# Patient Record
Sex: Male | Born: 1945 | Race: Black or African American | Hispanic: No | Marital: Married | Smoking: Never smoker
Health system: Southern US, Community
[De-identification: ages and names within clinical notes are randomized; demographics above are authoritative.]

## PROBLEM LIST (undated history)

## (undated) DIAGNOSIS — C61 Malignant neoplasm of prostate: Secondary | ICD-10-CM

## (undated) DIAGNOSIS — F419 Anxiety disorder, unspecified: Secondary | ICD-10-CM

## (undated) DIAGNOSIS — I10 Essential (primary) hypertension: Secondary | ICD-10-CM

## (undated) DIAGNOSIS — D126 Benign neoplasm of colon, unspecified: Secondary | ICD-10-CM

## (undated) DIAGNOSIS — M797 Fibromyalgia: Secondary | ICD-10-CM

## (undated) DIAGNOSIS — F4310 Post-traumatic stress disorder, unspecified: Secondary | ICD-10-CM

## (undated) DIAGNOSIS — D490 Neoplasm of unspecified behavior of digestive system: Secondary | ICD-10-CM

## (undated) DIAGNOSIS — K529 Noninfective gastroenteritis and colitis, unspecified: Secondary | ICD-10-CM

## (undated) DIAGNOSIS — R918 Other nonspecific abnormal finding of lung field: Secondary | ICD-10-CM

## (undated) DIAGNOSIS — Z8719 Personal history of other diseases of the digestive system: Secondary | ICD-10-CM

## (undated) DIAGNOSIS — E119 Type 2 diabetes mellitus without complications: Secondary | ICD-10-CM

## (undated) DIAGNOSIS — M549 Dorsalgia, unspecified: Secondary | ICD-10-CM

## (undated) DIAGNOSIS — K2970 Gastritis, unspecified, without bleeding: Secondary | ICD-10-CM

## (undated) DIAGNOSIS — K862 Cyst of pancreas: Secondary | ICD-10-CM

## (undated) DIAGNOSIS — F329 Major depressive disorder, single episode, unspecified: Secondary | ICD-10-CM

## (undated) DIAGNOSIS — K2270 Barrett's esophagus without dysplasia: Secondary | ICD-10-CM

## (undated) DIAGNOSIS — N433 Hydrocele, unspecified: Secondary | ICD-10-CM

## (undated) DIAGNOSIS — E785 Hyperlipidemia, unspecified: Secondary | ICD-10-CM

## (undated) DIAGNOSIS — C49A0 Gastrointestinal stromal tumor, unspecified site: Secondary | ICD-10-CM

## (undated) DIAGNOSIS — F32A Depression, unspecified: Secondary | ICD-10-CM

## (undated) DIAGNOSIS — H903 Sensorineural hearing loss, bilateral: Secondary | ICD-10-CM

## (undated) DIAGNOSIS — C49A2 Gastrointestinal stromal tumor of stomach: Secondary | ICD-10-CM

## (undated) DIAGNOSIS — G4730 Sleep apnea, unspecified: Secondary | ICD-10-CM

## (undated) DIAGNOSIS — K760 Fatty (change of) liver, not elsewhere classified: Secondary | ICD-10-CM

## (undated) DIAGNOSIS — K219 Gastro-esophageal reflux disease without esophagitis: Secondary | ICD-10-CM

## (undated) DIAGNOSIS — D649 Anemia, unspecified: Secondary | ICD-10-CM

## (undated) DIAGNOSIS — Z973 Presence of spectacles and contact lenses: Secondary | ICD-10-CM

## (undated) DIAGNOSIS — K589 Irritable bowel syndrome without diarrhea: Secondary | ICD-10-CM

## (undated) DIAGNOSIS — G8929 Other chronic pain: Secondary | ICD-10-CM

## (undated) DIAGNOSIS — B9681 Helicobacter pylori [H. pylori] as the cause of diseases classified elsewhere: Secondary | ICD-10-CM

## (undated) HISTORY — PX: PROSTATE BIOPSY: SHX241

## (undated) HISTORY — DX: Anxiety disorder, unspecified: F41.9

## (undated) HISTORY — DX: Helicobacter pylori (H. pylori) as the cause of diseases classified elsewhere: B96.81

## (undated) HISTORY — DX: Barrett's esophagus without dysplasia: K22.70

## (undated) HISTORY — PX: CHOLECYSTECTOMY: SHX55

## (undated) HISTORY — DX: Neoplasm of unspecified behavior of digestive system: D49.0

## (undated) HISTORY — DX: Benign neoplasm of colon, unspecified: D12.6

## (undated) HISTORY — DX: Cyst of pancreas: K86.2

## (undated) HISTORY — DX: Dorsalgia, unspecified: M54.9

## (undated) HISTORY — DX: Gastrointestinal stromal tumor, unspecified site: C49.A0

## (undated) HISTORY — DX: Gastro-esophageal reflux disease without esophagitis: K21.9

## (undated) HISTORY — DX: Gastritis, unspecified, without bleeding: K29.70

## (undated) HISTORY — DX: Major depressive disorder, single episode, unspecified: F32.9

## (undated) HISTORY — DX: Post-traumatic stress disorder, unspecified: F43.10

## (undated) HISTORY — DX: Essential (primary) hypertension: I10

## (undated) HISTORY — DX: Other chronic pain: G89.29

## (undated) HISTORY — DX: Depression, unspecified: F32.A

## (undated) HISTORY — DX: Noninfective gastroenteritis and colitis, unspecified: K52.9

---

## 1999-05-07 HISTORY — PX: CARDIAC CATHETERIZATION: SHX172

## 1999-09-05 ENCOUNTER — Inpatient Hospital Stay (HOSPITAL_COMMUNITY): Admission: EM | Admit: 1999-09-05 | Discharge: 1999-09-06 | Payer: Self-pay | Admitting: Cardiology

## 2000-11-15 ENCOUNTER — Encounter: Payer: Self-pay | Admitting: Emergency Medicine

## 2000-11-15 ENCOUNTER — Emergency Department (HOSPITAL_COMMUNITY): Admission: EM | Admit: 2000-11-15 | Discharge: 2000-11-15 | Payer: Self-pay | Admitting: Emergency Medicine

## 2001-04-03 ENCOUNTER — Encounter: Payer: Self-pay | Admitting: Family Medicine

## 2001-04-03 ENCOUNTER — Ambulatory Visit (HOSPITAL_COMMUNITY): Admission: RE | Admit: 2001-04-03 | Discharge: 2001-04-03 | Payer: Self-pay | Admitting: Family Medicine

## 2001-04-12 ENCOUNTER — Emergency Department (HOSPITAL_COMMUNITY): Admission: EM | Admit: 2001-04-12 | Discharge: 2001-04-12 | Payer: Self-pay | Admitting: Emergency Medicine

## 2001-10-28 ENCOUNTER — Encounter: Payer: Self-pay | Admitting: Family Medicine

## 2001-10-28 ENCOUNTER — Ambulatory Visit (HOSPITAL_COMMUNITY): Admission: RE | Admit: 2001-10-28 | Discharge: 2001-10-28 | Payer: Self-pay | Admitting: Family Medicine

## 2001-12-08 ENCOUNTER — Ambulatory Visit (HOSPITAL_COMMUNITY): Admission: RE | Admit: 2001-12-08 | Discharge: 2001-12-08 | Payer: Self-pay | Admitting: Gastroenterology

## 2002-08-11 ENCOUNTER — Ambulatory Visit (HOSPITAL_COMMUNITY): Admission: RE | Admit: 2002-08-11 | Discharge: 2002-08-11 | Payer: Self-pay | Admitting: Family Medicine

## 2002-08-11 ENCOUNTER — Encounter: Payer: Self-pay | Admitting: Family Medicine

## 2003-10-27 ENCOUNTER — Ambulatory Visit (HOSPITAL_COMMUNITY): Admission: RE | Admit: 2003-10-27 | Discharge: 2003-10-27 | Payer: Self-pay | Admitting: Family Medicine

## 2003-12-14 ENCOUNTER — Ambulatory Visit (HOSPITAL_COMMUNITY): Admission: RE | Admit: 2003-12-14 | Discharge: 2003-12-14 | Payer: Self-pay | Admitting: Internal Medicine

## 2004-06-04 ENCOUNTER — Emergency Department (HOSPITAL_COMMUNITY): Admission: EM | Admit: 2004-06-04 | Discharge: 2004-06-04 | Payer: Self-pay | Admitting: Emergency Medicine

## 2004-06-13 ENCOUNTER — Ambulatory Visit: Payer: Self-pay | Admitting: Orthopedic Surgery

## 2004-06-21 ENCOUNTER — Ambulatory Visit: Payer: Self-pay | Admitting: Orthopedic Surgery

## 2004-06-25 ENCOUNTER — Ambulatory Visit (HOSPITAL_COMMUNITY): Admission: RE | Admit: 2004-06-25 | Discharge: 2004-06-25 | Payer: Self-pay | Admitting: Orthopedic Surgery

## 2004-07-23 ENCOUNTER — Ambulatory Visit: Payer: Self-pay | Admitting: Orthopedic Surgery

## 2004-10-02 ENCOUNTER — Ambulatory Visit: Payer: Self-pay | Admitting: *Deleted

## 2004-10-11 ENCOUNTER — Ambulatory Visit: Payer: Self-pay | Admitting: Internal Medicine

## 2005-12-03 ENCOUNTER — Ambulatory Visit: Payer: Self-pay | Admitting: Internal Medicine

## 2006-03-21 ENCOUNTER — Encounter (INDEPENDENT_AMBULATORY_CARE_PROVIDER_SITE_OTHER): Payer: Self-pay | Admitting: Specialist

## 2006-03-21 ENCOUNTER — Ambulatory Visit (HOSPITAL_COMMUNITY): Admission: RE | Admit: 2006-03-21 | Discharge: 2006-03-21 | Payer: Self-pay | Admitting: Internal Medicine

## 2006-03-21 ENCOUNTER — Ambulatory Visit: Payer: Self-pay | Admitting: Internal Medicine

## 2006-04-10 ENCOUNTER — Ambulatory Visit: Payer: Self-pay | Admitting: Internal Medicine

## 2006-08-20 ENCOUNTER — Ambulatory Visit: Payer: Self-pay | Admitting: Internal Medicine

## 2006-09-16 ENCOUNTER — Ambulatory Visit (HOSPITAL_COMMUNITY): Admission: RE | Admit: 2006-09-16 | Discharge: 2006-09-16 | Payer: Self-pay | Admitting: Family Medicine

## 2007-05-02 ENCOUNTER — Emergency Department (HOSPITAL_COMMUNITY): Admission: EM | Admit: 2007-05-02 | Discharge: 2007-05-02 | Payer: Self-pay | Admitting: Emergency Medicine

## 2007-05-07 DIAGNOSIS — B9681 Helicobacter pylori [H. pylori] as the cause of diseases classified elsewhere: Secondary | ICD-10-CM

## 2007-05-07 HISTORY — DX: Helicobacter pylori (H. pylori) as the cause of diseases classified elsewhere: B96.81

## 2008-01-04 ENCOUNTER — Ambulatory Visit (HOSPITAL_COMMUNITY): Admission: RE | Admit: 2008-01-04 | Discharge: 2008-01-04 | Payer: Self-pay | Admitting: Family Medicine

## 2008-01-07 ENCOUNTER — Ambulatory Visit (HOSPITAL_COMMUNITY): Admission: RE | Admit: 2008-01-07 | Discharge: 2008-01-07 | Payer: Self-pay | Admitting: Family Medicine

## 2008-03-21 ENCOUNTER — Encounter (HOSPITAL_COMMUNITY)
Admission: RE | Admit: 2008-03-21 | Discharge: 2008-04-20 | Payer: Self-pay | Admitting: Physical Medicine and Rehabilitation

## 2008-05-16 ENCOUNTER — Ambulatory Visit: Payer: Self-pay | Admitting: Internal Medicine

## 2008-05-27 ENCOUNTER — Encounter: Payer: Self-pay | Admitting: Internal Medicine

## 2008-05-27 ENCOUNTER — Ambulatory Visit: Payer: Self-pay | Admitting: Internal Medicine

## 2008-05-27 ENCOUNTER — Ambulatory Visit (HOSPITAL_COMMUNITY): Admission: RE | Admit: 2008-05-27 | Discharge: 2008-05-27 | Payer: Self-pay | Admitting: Internal Medicine

## 2008-07-22 ENCOUNTER — Emergency Department (HOSPITAL_COMMUNITY): Admission: EM | Admit: 2008-07-22 | Discharge: 2008-07-22 | Payer: Self-pay | Admitting: Emergency Medicine

## 2008-08-16 DIAGNOSIS — E119 Type 2 diabetes mellitus without complications: Secondary | ICD-10-CM | POA: Insufficient documentation

## 2008-08-16 DIAGNOSIS — I10 Essential (primary) hypertension: Secondary | ICD-10-CM | POA: Insufficient documentation

## 2008-08-16 DIAGNOSIS — F3289 Other specified depressive episodes: Secondary | ICD-10-CM | POA: Insufficient documentation

## 2008-08-16 DIAGNOSIS — Z8719 Personal history of other diseases of the digestive system: Secondary | ICD-10-CM | POA: Insufficient documentation

## 2008-08-16 DIAGNOSIS — M549 Dorsalgia, unspecified: Secondary | ICD-10-CM | POA: Insufficient documentation

## 2008-08-16 DIAGNOSIS — F411 Generalized anxiety disorder: Secondary | ICD-10-CM | POA: Insufficient documentation

## 2008-08-16 DIAGNOSIS — K219 Gastro-esophageal reflux disease without esophagitis: Secondary | ICD-10-CM | POA: Insufficient documentation

## 2008-08-16 DIAGNOSIS — F4310 Post-traumatic stress disorder, unspecified: Secondary | ICD-10-CM | POA: Insufficient documentation

## 2008-08-16 DIAGNOSIS — F329 Major depressive disorder, single episode, unspecified: Secondary | ICD-10-CM | POA: Insufficient documentation

## 2008-08-17 ENCOUNTER — Ambulatory Visit: Payer: Self-pay | Admitting: Internal Medicine

## 2009-03-06 HISTORY — PX: LAPAROSCOPIC SMALL BOWEL RESECTION: SUR793

## 2009-03-31 ENCOUNTER — Inpatient Hospital Stay (HOSPITAL_COMMUNITY): Admission: EM | Admit: 2009-03-31 | Discharge: 2009-04-11 | Payer: Self-pay | Admitting: Emergency Medicine

## 2009-04-03 ENCOUNTER — Encounter (INDEPENDENT_AMBULATORY_CARE_PROVIDER_SITE_OTHER): Payer: Self-pay | Admitting: General Surgery

## 2009-08-09 ENCOUNTER — Ambulatory Visit (HOSPITAL_COMMUNITY): Admission: RE | Admit: 2009-08-09 | Discharge: 2009-08-09 | Payer: Self-pay | Admitting: Family Medicine

## 2010-01-04 ENCOUNTER — Emergency Department (HOSPITAL_COMMUNITY): Admission: EM | Admit: 2010-01-04 | Discharge: 2010-01-04 | Payer: Self-pay | Admitting: Emergency Medicine

## 2010-03-29 ENCOUNTER — Emergency Department (HOSPITAL_COMMUNITY): Admission: EM | Admit: 2010-03-29 | Discharge: 2010-03-29 | Payer: Self-pay | Admitting: Emergency Medicine

## 2010-06-07 NOTE — Assessment & Plan Note (Signed)
Summary: FU OV 3 MO FROM PROCEDURE/AMS   Visit Type:  Follow-up Visit Primary Care Provider:  McInnis  Chief Complaint:  F/U from procedure.  History of Present Illness: Patient is a 65 y/o AA male, with h/o chronic GERD and Barrett's esophagus, who presents for f/u of refractory symptoms.  He underwent EGD on 05/27/08 which showed hiatal hernia and Barrett's esophagus.  Patient had complete resolution with increase in omeprazole to 20mg  by mouth two times a day.  He only has symptoms if he forgets his medications.  Recently went to ED with abdominal pain and says he was told it was due to constipation.  He took a laxative and the symptoms are gone.  Denies persistent problems with bowels, melena, brbpr.  Last TCS in 2008 by Dr. Randa Evens, per patient.  Last one in [place] from 2003.        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 Two Times A Day 2)  Cozaar 100 Mg Tabs (Losartan Potassium) .... Once Daily 3)  Hydrochlorothiazide 25 Mg Tabs (Hydrochlorothiazide) .... Once Daily 4)  Levitra 20 Mg Tabs (Vardenafil Hcl) .... Take 1/2 As Directed 5)  Clonazepam 1 Mg Tabs (Clonazepam) .... Two Times A Day 6)  Metformin Hcl 1000 Mg Tabs (Metformin Hcl) .... Two Times A Day 7)  Tylenol Extra Strength 500 Mg Tabs (Acetaminophen) .... Take 2 Tablets 4 Times A Day As Needed 8)  Crestor 20 Mg Tabs (Rosuvastatin Calcium) .... Once Daily 9)   Quetipine Fumorate 200mg  .... Take 1/2 Tablet Every Evening  Allergies (verified): 1)  ! Pcn  Review of Systems General:  Denies fever and weakness. CV:  Denies chest pains, palpitations, dyspnea on exertion, and peripheral edema. Resp:  Denies dyspnea at rest, dyspnea with exercise, and cough. GI:  See HPI. GU:  Denies urinary burning and blood in urine. Endo:  Denies unusual weight change.  Vital Signs:  Patient profile:   65 year old male Height:      69 inches Weight:      171 pounds BMI:     25.34 Temp:     97.9 degrees F oral Pulse rate:   72 / minute BP sitting:   140 / 78  (left arm) Cuff size:   regular  Vitals Entered By: Cloria Spring LPN (August 17, 2008 2:04 PM)  Physical Exam  General:  Well developed, well nourished, no acute distress. Head:  Normocephalic and atraumatic. Eyes:  Conjunctivae pink, no scleral icterus.  Mouth:  OP moist. Lungs:  Clear throughout to auscultation. Heart:  Regular rate and rhythm; no murmurs, rubs,  or bruits. Abdomen:  Soft, nontender and nondistended. No masses, hepatosplenomegaly or hernias noted. Normal bowel sounds. Extremities:  No clubbing, cyanosis, edema or deformities noted. Neurologic:  Alert and  oriented x4;  grossly normal neurologically. Skin:  Intact without significant lesions or rashes. Psych:  Alert and cooperative. Normal mood and affect.  Impression & Recommendations:  Problem # 1:  BARRETT'S ESOPHAGUS, HX OF (ICD-V12.79)  Surveillance EGD due in 05/2011.  Continue omeprazole 20mg  by mouth two times a day.  Written RX for #60 and 11 refills given.    Orders: Est. Patient Level II (78469)  Problem # 2:  GASTROESOPHAGEAL REFLUX DISEASE, CHRONIC (ICD-530.81)  Continue omeprazole 20mg  by mouth two times a day.  If he misses a dose he has breakthrough symptoms.  F/U OV in 2 years.  Orders: Est. Patient Level II (62952)  Problem # 3:  * CHRONIC COLITIS  Remote concern for UC on bx in the 1990s.  Chronic  colitis seen on biospy in 1992.  Last colonoscopies have been with Dr. Ross Ludwig, last one in 2008 per patient.  No symptoms to suggest colitis.  Orders: Est. Patient Level II (84132)

## 2010-07-17 LAB — CBC
HCT: 35.3 % — ABNORMAL LOW (ref 39.0–52.0)
Hemoglobin: 12.1 g/dL — ABNORMAL LOW (ref 13.0–17.0)
MCH: 26.5 pg (ref 26.0–34.0)
MCHC: 34.3 g/dL (ref 30.0–36.0)
MCV: 77.4 fL — ABNORMAL LOW (ref 78.0–100.0)
Platelets: 158 10*3/uL (ref 150–400)
RBC: 4.56 MIL/uL (ref 4.22–5.81)
RDW: 14.7 % (ref 11.5–15.5)
WBC: 5.3 10*3/uL (ref 4.0–10.5)

## 2010-07-17 LAB — DIFFERENTIAL
Basophils Absolute: 0 10*3/uL (ref 0.0–0.1)
Basophils Relative: 0 % (ref 0–1)
Eosinophils Absolute: 0.2 10*3/uL (ref 0.0–0.7)
Eosinophils Relative: 4 % (ref 0–5)
Lymphocytes Relative: 39 % (ref 12–46)
Lymphs Abs: 2.1 10*3/uL (ref 0.7–4.0)
Monocytes Absolute: 0.5 10*3/uL (ref 0.1–1.0)
Monocytes Relative: 9 % (ref 3–12)
Neutro Abs: 2.5 10*3/uL (ref 1.7–7.7)
Neutrophils Relative %: 48 % (ref 43–77)

## 2010-07-17 LAB — URINALYSIS, ROUTINE W REFLEX MICROSCOPIC
Bilirubin Urine: NEGATIVE
Glucose, UA: NEGATIVE mg/dL
Hgb urine dipstick: NEGATIVE
Ketones, ur: NEGATIVE mg/dL
Nitrite: NEGATIVE
Protein, ur: NEGATIVE mg/dL
Specific Gravity, Urine: 1.025 (ref 1.005–1.030)
Urobilinogen, UA: 0.2 mg/dL (ref 0.0–1.0)
pH: 6 (ref 5.0–8.0)

## 2010-07-17 LAB — BASIC METABOLIC PANEL
BUN: 20 mg/dL (ref 6–23)
CO2: 26 mEq/L (ref 19–32)
Calcium: 8.9 mg/dL (ref 8.4–10.5)
Chloride: 104 mEq/L (ref 96–112)
Creatinine, Ser: 1.21 mg/dL (ref 0.4–1.5)
GFR calc Af Amer: >60 mL/min (ref >60)
GFR calc non Af Amer: >60 mL/min (ref >60)
Glucose, Bld: 135 mg/dL — ABNORMAL HIGH (ref 70–99)
Potassium: 3.3 mEq/L — ABNORMAL LOW (ref 3.5–5.1)
Sodium: 137 mEq/L (ref 135–145)

## 2010-07-17 LAB — LIPASE, BLOOD: Lipase: 40 U/L (ref 11–59)

## 2010-07-17 LAB — GLUCOSE, CAPILLARY: Glucose-Capillary: 145 mg/dL — ABNORMAL HIGH (ref 70–99)

## 2010-08-07 LAB — DIFFERENTIAL
Basophils Absolute: 0 10*3/uL (ref 0.0–0.1)
Basophils Absolute: 0 10*3/uL (ref 0.0–0.1)
Basophils Absolute: 0 10*3/uL (ref 0.0–0.1)
Basophils Absolute: 0 10*3/uL (ref 0.0–0.1)
Basophils Relative: 0 % (ref 0–1)
Basophils Relative: 0 % (ref 0–1)
Basophils Relative: 0 % (ref 0–1)
Basophils Relative: 0 % (ref 0–1)
Eosinophils Absolute: 0.1 10*3/uL (ref 0.0–0.7)
Eosinophils Absolute: 0.2 10*3/uL (ref 0.0–0.7)
Eosinophils Absolute: 0.2 10*3/uL (ref 0.0–0.7)
Eosinophils Absolute: 0.2 10*3/uL (ref 0.0–0.7)
Eosinophils Relative: 1 % (ref 0–5)
Eosinophils Relative: 2 % (ref 0–5)
Eosinophils Relative: 3 % (ref 0–5)
Eosinophils Relative: 4 % (ref 0–5)
Lymphocytes Relative: 18 % (ref 12–46)
Lymphocytes Relative: 18 % (ref 12–46)
Lymphocytes Relative: 21 % (ref 12–46)
Lymphocytes Relative: 29 % (ref 12–46)
Lymphs Abs: 1.2 10*3/uL (ref 0.7–4.0)
Lymphs Abs: 1.3 10*3/uL (ref 0.7–4.0)
Lymphs Abs: 1.5 10*3/uL (ref 0.7–4.0)
Lymphs Abs: 1.7 10*3/uL (ref 0.7–4.0)
Monocytes Absolute: 0.6 10*3/uL (ref 0.1–1.0)
Monocytes Absolute: 0.7 10*3/uL (ref 0.1–1.0)
Monocytes Absolute: 0.8 10*3/uL (ref 0.1–1.0)
Monocytes Absolute: 0.9 10*3/uL (ref 0.1–1.0)
Monocytes Relative: 11 % (ref 3–12)
Monocytes Relative: 13 % — ABNORMAL HIGH (ref 3–12)
Monocytes Relative: 13 % — ABNORMAL HIGH (ref 3–12)
Monocytes Relative: 9 % (ref 3–12)
Neutro Abs: 3.1 10*3/uL (ref 1.7–7.7)
Neutro Abs: 4.4 10*3/uL (ref 1.7–7.7)
Neutro Abs: 4.8 10*3/uL (ref 1.7–7.7)
Neutro Abs: 5.2 10*3/uL (ref 1.7–7.7)
Neutrophils Relative %: 54 % (ref 43–77)
Neutrophils Relative %: 65 % (ref 43–77)
Neutrophils Relative %: 67 % (ref 43–77)
Neutrophils Relative %: 71 % (ref 43–77)

## 2010-08-07 LAB — PHOSPHORUS
Phosphorus: 2.9 mg/dL (ref 2.3–4.6)
Phosphorus: 3.3 mg/dL (ref 2.3–4.6)
Phosphorus: 3.4 mg/dL (ref 2.3–4.6)
Phosphorus: 3.5 mg/dL (ref 2.3–4.6)
Phosphorus: 3.7 mg/dL (ref 2.3–4.6)
Phosphorus: 3.8 mg/dL (ref 2.3–4.6)
Phosphorus: 4.1 mg/dL (ref 2.3–4.6)

## 2010-08-07 LAB — CBC
HCT: 36.1 % — ABNORMAL LOW (ref 39.0–52.0)
HCT: 36.2 % — ABNORMAL LOW (ref 39.0–52.0)
HCT: 37.1 % — ABNORMAL LOW (ref 39.0–52.0)
HCT: 40.6 % (ref 39.0–52.0)
Hemoglobin: 12.3 g/dL — ABNORMAL LOW (ref 13.0–17.0)
Hemoglobin: 12.3 g/dL — ABNORMAL LOW (ref 13.0–17.0)
Hemoglobin: 12.7 g/dL — ABNORMAL LOW (ref 13.0–17.0)
Hemoglobin: 13.9 g/dL (ref 13.0–17.0)
MCHC: 33.8 g/dL (ref 30.0–36.0)
MCHC: 34.1 g/dL (ref 30.0–36.0)
MCHC: 34.1 g/dL (ref 30.0–36.0)
MCHC: 34.3 g/dL (ref 30.0–36.0)
MCV: 83.5 fL (ref 78.0–100.0)
MCV: 84.2 fL (ref 78.0–100.0)
MCV: 84.4 fL (ref 78.0–100.0)
MCV: 84.4 fL (ref 78.0–100.0)
Platelets: 180 10*3/uL (ref 150–400)
Platelets: 186 10*3/uL (ref 150–400)
Platelets: 241 10*3/uL (ref 150–400)
Platelets: 245 10*3/uL (ref 150–400)
RBC: 4.28 MIL/uL (ref 4.22–5.81)
RBC: 4.29 MIL/uL (ref 4.22–5.81)
RBC: 4.4 MIL/uL (ref 4.22–5.81)
RBC: 4.86 MIL/uL (ref 4.22–5.81)
RDW: 14.2 % (ref 11.5–15.5)
RDW: 14.4 % (ref 11.5–15.5)
RDW: 14.6 % (ref 11.5–15.5)
RDW: 14.6 % (ref 11.5–15.5)
WBC: 5.7 10*3/uL (ref 4.0–10.5)
WBC: 6.5 10*3/uL (ref 4.0–10.5)
WBC: 7.3 10*3/uL (ref 4.0–10.5)
WBC: 7.4 10*3/uL (ref 4.0–10.5)

## 2010-08-07 LAB — BASIC METABOLIC PANEL
BUN: 7 mg/dL (ref 6–23)
BUN: 8 mg/dL (ref 6–23)
BUN: 9 mg/dL (ref 6–23)
BUN: 9 mg/dL (ref 6–23)
CO2: 28 mEq/L (ref 19–32)
CO2: 29 mEq/L (ref 19–32)
CO2: 29 mEq/L (ref 19–32)
CO2: 31 mEq/L (ref 19–32)
Calcium: 8.4 mg/dL (ref 8.4–10.5)
Calcium: 8.7 mg/dL (ref 8.4–10.5)
Calcium: 8.7 mg/dL (ref 8.4–10.5)
Calcium: 8.7 mg/dL (ref 8.4–10.5)
Chloride: 101 mEq/L (ref 96–112)
Chloride: 101 mEq/L (ref 96–112)
Chloride: 101 mEq/L (ref 96–112)
Chloride: 102 mEq/L (ref 96–112)
Creatinine, Ser: 1.17 mg/dL (ref 0.4–1.5)
Creatinine, Ser: 1.22 mg/dL (ref 0.4–1.5)
Creatinine, Ser: 1.3 mg/dL (ref 0.4–1.5)
Creatinine, Ser: 1.33 mg/dL (ref 0.4–1.5)
GFR calc Af Amer: >60 mL/min (ref >60)
GFR calc Af Amer: >60 mL/min (ref >60)
GFR calc Af Amer: >60 mL/min (ref >60)
GFR calc Af Amer: >60 mL/min (ref >60)
GFR calc non Af Amer: 54 mL/min — ABNORMAL LOW (ref >60)
GFR calc non Af Amer: 55 mL/min — ABNORMAL LOW (ref >60)
GFR calc non Af Amer: 60 mL/min — ABNORMAL LOW (ref >60)
GFR calc non Af Amer: >60 mL/min (ref >60)
Glucose, Bld: 152 mg/dL — ABNORMAL HIGH (ref 70–99)
Glucose, Bld: 163 mg/dL — ABNORMAL HIGH (ref 70–99)
Glucose, Bld: 198 mg/dL — ABNORMAL HIGH (ref 70–99)
Glucose, Bld: 216 mg/dL — ABNORMAL HIGH (ref 70–99)
Potassium: 3.2 mEq/L — ABNORMAL LOW (ref 3.5–5.1)
Potassium: 3.3 mEq/L — ABNORMAL LOW (ref 3.5–5.1)
Potassium: 3.5 mEq/L (ref 3.5–5.1)
Potassium: 3.9 mEq/L (ref 3.5–5.1)
Sodium: 137 mEq/L (ref 135–145)
Sodium: 139 mEq/L (ref 135–145)
Sodium: 139 mEq/L (ref 135–145)
Sodium: 140 mEq/L (ref 135–145)

## 2010-08-07 LAB — GLUCOSE, CAPILLARY
Glucose-Capillary: 108 mg/dL — ABNORMAL HIGH (ref 70–99)
Glucose-Capillary: 111 mg/dL — ABNORMAL HIGH (ref 70–99)
Glucose-Capillary: 111 mg/dL — ABNORMAL HIGH (ref 70–99)
Glucose-Capillary: 125 mg/dL — ABNORMAL HIGH (ref 70–99)
Glucose-Capillary: 129 mg/dL — ABNORMAL HIGH (ref 70–99)
Glucose-Capillary: 139 mg/dL — ABNORMAL HIGH (ref 70–99)
Glucose-Capillary: 146 mg/dL — ABNORMAL HIGH (ref 70–99)
Glucose-Capillary: 147 mg/dL — ABNORMAL HIGH (ref 70–99)
Glucose-Capillary: 151 mg/dL — ABNORMAL HIGH (ref 70–99)
Glucose-Capillary: 153 mg/dL — ABNORMAL HIGH (ref 70–99)
Glucose-Capillary: 153 mg/dL — ABNORMAL HIGH (ref 70–99)
Glucose-Capillary: 154 mg/dL — ABNORMAL HIGH (ref 70–99)
Glucose-Capillary: 156 mg/dL — ABNORMAL HIGH (ref 70–99)
Glucose-Capillary: 157 mg/dL — ABNORMAL HIGH (ref 70–99)
Glucose-Capillary: 160 mg/dL — ABNORMAL HIGH (ref 70–99)
Glucose-Capillary: 162 mg/dL — ABNORMAL HIGH (ref 70–99)
Glucose-Capillary: 163 mg/dL — ABNORMAL HIGH (ref 70–99)
Glucose-Capillary: 164 mg/dL — ABNORMAL HIGH (ref 70–99)
Glucose-Capillary: 166 mg/dL — ABNORMAL HIGH (ref 70–99)
Glucose-Capillary: 176 mg/dL — ABNORMAL HIGH (ref 70–99)
Glucose-Capillary: 178 mg/dL — ABNORMAL HIGH (ref 70–99)
Glucose-Capillary: 178 mg/dL — ABNORMAL HIGH (ref 70–99)
Glucose-Capillary: 180 mg/dL — ABNORMAL HIGH (ref 70–99)
Glucose-Capillary: 181 mg/dL — ABNORMAL HIGH (ref 70–99)
Glucose-Capillary: 182 mg/dL — ABNORMAL HIGH (ref 70–99)
Glucose-Capillary: 188 mg/dL — ABNORMAL HIGH (ref 70–99)
Glucose-Capillary: 188 mg/dL — ABNORMAL HIGH (ref 70–99)
Glucose-Capillary: 188 mg/dL — ABNORMAL HIGH (ref 70–99)
Glucose-Capillary: 207 mg/dL — ABNORMAL HIGH (ref 70–99)
Glucose-Capillary: 211 mg/dL — ABNORMAL HIGH (ref 70–99)

## 2010-08-07 LAB — MAGNESIUM
Magnesium: 1.7 mg/dL (ref 1.5–2.5)
Magnesium: 1.8 mg/dL (ref 1.5–2.5)
Magnesium: 1.8 mg/dL (ref 1.5–2.5)
Magnesium: 1.8 mg/dL (ref 1.5–2.5)
Magnesium: 1.9 mg/dL (ref 1.5–2.5)
Magnesium: 1.9 mg/dL (ref 1.5–2.5)
Magnesium: 1.9 mg/dL (ref 1.5–2.5)

## 2010-08-08 LAB — GLUCOSE, CAPILLARY
Glucose-Capillary: 101 mg/dL — ABNORMAL HIGH (ref 70–99)
Glucose-Capillary: 111 mg/dL — ABNORMAL HIGH (ref 70–99)
Glucose-Capillary: 113 mg/dL — ABNORMAL HIGH (ref 70–99)
Glucose-Capillary: 115 mg/dL — ABNORMAL HIGH (ref 70–99)
Glucose-Capillary: 115 mg/dL — ABNORMAL HIGH (ref 70–99)
Glucose-Capillary: 123 mg/dL — ABNORMAL HIGH (ref 70–99)
Glucose-Capillary: 130 mg/dL — ABNORMAL HIGH (ref 70–99)
Glucose-Capillary: 135 mg/dL — ABNORMAL HIGH (ref 70–99)
Glucose-Capillary: 136 mg/dL — ABNORMAL HIGH (ref 70–99)
Glucose-Capillary: 148 mg/dL — ABNORMAL HIGH (ref 70–99)
Glucose-Capillary: 152 mg/dL — ABNORMAL HIGH (ref 70–99)
Glucose-Capillary: 161 mg/dL — ABNORMAL HIGH (ref 70–99)
Glucose-Capillary: 178 mg/dL — ABNORMAL HIGH (ref 70–99)
Glucose-Capillary: 207 mg/dL — ABNORMAL HIGH (ref 70–99)
Glucose-Capillary: 217 mg/dL — ABNORMAL HIGH (ref 70–99)
Glucose-Capillary: 58 mg/dL — ABNORMAL LOW (ref 70–99)
Glucose-Capillary: 77 mg/dL (ref 70–99)
Glucose-Capillary: 81 mg/dL (ref 70–99)
Glucose-Capillary: 88 mg/dL (ref 70–99)
Glucose-Capillary: 92 mg/dL (ref 70–99)
Glucose-Capillary: 96 mg/dL (ref 70–99)
Glucose-Capillary: 96 mg/dL (ref 70–99)

## 2010-08-08 LAB — BASIC METABOLIC PANEL
BUN: 16 mg/dL (ref 6–23)
BUN: 7 mg/dL (ref 6–23)
BUN: 8 mg/dL (ref 6–23)
BUN: 9 mg/dL (ref 6–23)
CO2: 27 mEq/L (ref 19–32)
CO2: 29 mEq/L (ref 19–32)
CO2: 30 mEq/L (ref 19–32)
CO2: 31 mEq/L (ref 19–32)
Calcium: 8.7 mg/dL (ref 8.4–10.5)
Calcium: 8.8 mg/dL (ref 8.4–10.5)
Calcium: 8.9 mg/dL (ref 8.4–10.5)
Calcium: 9.9 mg/dL (ref 8.4–10.5)
Chloride: 101 mEq/L (ref 96–112)
Chloride: 102 mEq/L (ref 96–112)
Chloride: 103 mEq/L (ref 96–112)
Chloride: 99 mEq/L (ref 96–112)
Creatinine, Ser: 1.19 mg/dL (ref 0.4–1.5)
Creatinine, Ser: 1.21 mg/dL (ref 0.4–1.5)
Creatinine, Ser: 1.27 mg/dL (ref 0.4–1.5)
Creatinine, Ser: 1.27 mg/dL (ref 0.4–1.5)
GFR calc Af Amer: >60 mL/min (ref >60)
GFR calc Af Amer: >60 mL/min (ref >60)
GFR calc Af Amer: >60 mL/min (ref >60)
GFR calc Af Amer: >60 mL/min (ref >60)
GFR calc non Af Amer: 57 mL/min — ABNORMAL LOW (ref >60)
GFR calc non Af Amer: 57 mL/min — ABNORMAL LOW (ref >60)
GFR calc non Af Amer: >60 mL/min (ref >60)
GFR calc non Af Amer: >60 mL/min (ref >60)
Glucose, Bld: 128 mg/dL — ABNORMAL HIGH (ref 70–99)
Glucose, Bld: 164 mg/dL — ABNORMAL HIGH (ref 70–99)
Glucose, Bld: 201 mg/dL — ABNORMAL HIGH (ref 70–99)
Glucose, Bld: 89 mg/dL (ref 70–99)
Potassium: 3.1 mEq/L — ABNORMAL LOW (ref 3.5–5.1)
Potassium: 3.4 mEq/L — ABNORMAL LOW (ref 3.5–5.1)
Potassium: 3.5 mEq/L (ref 3.5–5.1)
Potassium: 3.7 mEq/L (ref 3.5–5.1)
Sodium: 135 mEq/L (ref 135–145)
Sodium: 137 mEq/L (ref 135–145)
Sodium: 138 mEq/L (ref 135–145)
Sodium: 138 mEq/L (ref 135–145)

## 2010-08-08 LAB — COMPREHENSIVE METABOLIC PANEL
ALT: 39 U/L (ref 0–53)
AST: 32 U/L (ref 0–37)
Albumin: 3.6 g/dL (ref 3.5–5.2)
Alkaline Phosphatase: 39 U/L (ref 39–117)
BUN: 11 mg/dL (ref 6–23)
CO2: 29 mEq/L (ref 19–32)
Calcium: 8.9 mg/dL (ref 8.4–10.5)
Chloride: 104 mEq/L (ref 96–112)
Creatinine, Ser: 1.3 mg/dL (ref 0.4–1.5)
GFR calc Af Amer: >60 mL/min (ref >60)
GFR calc non Af Amer: 55 mL/min — ABNORMAL LOW (ref >60)
Glucose, Bld: 131 mg/dL — ABNORMAL HIGH (ref 70–99)
Potassium: 3.9 mEq/L (ref 3.5–5.1)
Sodium: 139 mEq/L (ref 135–145)
Total Bilirubin: 0.6 mg/dL (ref 0.3–1.2)
Total Protein: 6.7 g/dL (ref 6.0–8.3)

## 2010-08-08 LAB — DIFFERENTIAL
Basophils Absolute: 0 10*3/uL (ref 0.0–0.1)
Basophils Absolute: 0 10*3/uL (ref 0.0–0.1)
Basophils Absolute: 0 10*3/uL (ref 0.0–0.1)
Basophils Absolute: 0 10*3/uL (ref 0.0–0.1)
Basophils Absolute: 0 10*3/uL (ref 0.0–0.1)
Basophils Relative: 0 % (ref 0–1)
Basophils Relative: 0 % (ref 0–1)
Basophils Relative: 0 % (ref 0–1)
Basophils Relative: 0 % (ref 0–1)
Basophils Relative: 1 % (ref 0–1)
Eosinophils Absolute: 0 10*3/uL (ref 0.0–0.7)
Eosinophils Absolute: 0 10*3/uL (ref 0.0–0.7)
Eosinophils Absolute: 0.1 10*3/uL (ref 0.0–0.7)
Eosinophils Absolute: 0.1 10*3/uL (ref 0.0–0.7)
Eosinophils Absolute: 0.1 10*3/uL (ref 0.0–0.7)
Eosinophils Relative: 0 % (ref 0–5)
Eosinophils Relative: 0 % (ref 0–5)
Eosinophils Relative: 2 % (ref 0–5)
Eosinophils Relative: 2 % (ref 0–5)
Eosinophils Relative: 3 % (ref 0–5)
Lymphocytes Relative: 11 % — ABNORMAL LOW (ref 12–46)
Lymphocytes Relative: 17 % (ref 12–46)
Lymphocytes Relative: 37 % (ref 12–46)
Lymphocytes Relative: 38 % (ref 12–46)
Lymphocytes Relative: 40 % (ref 12–46)
Lymphs Abs: 1.1 10*3/uL (ref 0.7–4.0)
Lymphs Abs: 1.3 10*3/uL (ref 0.7–4.0)
Lymphs Abs: 1.9 10*3/uL (ref 0.7–4.0)
Lymphs Abs: 2.1 10*3/uL (ref 0.7–4.0)
Lymphs Abs: 2.1 10*3/uL (ref 0.7–4.0)
Monocytes Absolute: 0.2 10*3/uL (ref 0.1–1.0)
Monocytes Absolute: 0.6 10*3/uL (ref 0.1–1.0)
Monocytes Absolute: 0.6 10*3/uL (ref 0.1–1.0)
Monocytes Absolute: 0.6 10*3/uL (ref 0.1–1.0)
Monocytes Absolute: 0.6 10*3/uL (ref 0.1–1.0)
Monocytes Relative: 10 % (ref 3–12)
Monocytes Relative: 11 % (ref 3–12)
Monocytes Relative: 12 % (ref 3–12)
Monocytes Relative: 2 % — ABNORMAL LOW (ref 3–12)
Monocytes Relative: 8 % (ref 3–12)
Neutro Abs: 2.1 10*3/uL (ref 1.7–7.7)
Neutro Abs: 2.7 10*3/uL (ref 1.7–7.7)
Neutro Abs: 2.9 10*3/uL (ref 1.7–7.7)
Neutro Abs: 5.7 10*3/uL (ref 1.7–7.7)
Neutro Abs: 8.3 10*3/uL — ABNORMAL HIGH (ref 1.7–7.7)
Neutrophils Relative %: 45 % (ref 43–77)
Neutrophils Relative %: 49 % (ref 43–77)
Neutrophils Relative %: 51 % (ref 43–77)
Neutrophils Relative %: 74 % (ref 43–77)
Neutrophils Relative %: 87 % — ABNORMAL HIGH (ref 43–77)

## 2010-08-08 LAB — CROSSMATCH
ABO/RH(D): B POS
Antibody Screen: NEGATIVE

## 2010-08-08 LAB — CBC
HCT: 37.7 % — ABNORMAL LOW (ref 39.0–52.0)
HCT: 38.7 % — ABNORMAL LOW (ref 39.0–52.0)
HCT: 39.8 % (ref 39.0–52.0)
HCT: 40.8 % (ref 39.0–52.0)
HCT: 42.9 % (ref 39.0–52.0)
Hemoglobin: 12.9 g/dL — ABNORMAL LOW (ref 13.0–17.0)
Hemoglobin: 13 g/dL (ref 13.0–17.0)
Hemoglobin: 13.6 g/dL (ref 13.0–17.0)
Hemoglobin: 13.9 g/dL (ref 13.0–17.0)
Hemoglobin: 14.5 g/dL (ref 13.0–17.0)
MCHC: 33.7 g/dL (ref 30.0–36.0)
MCHC: 33.8 g/dL (ref 30.0–36.0)
MCHC: 34.1 g/dL (ref 30.0–36.0)
MCHC: 34.1 g/dL (ref 30.0–36.0)
MCHC: 34.3 g/dL (ref 30.0–36.0)
MCV: 84 fL (ref 78.0–100.0)
MCV: 84.3 fL (ref 78.0–100.0)
MCV: 84.3 fL (ref 78.0–100.0)
MCV: 84.7 fL (ref 78.0–100.0)
MCV: 84.8 fL (ref 78.0–100.0)
Platelets: 176 10*3/uL (ref 150–400)
Platelets: 177 10*3/uL (ref 150–400)
Platelets: 191 10*3/uL (ref 150–400)
Platelets: 199 10*3/uL (ref 150–400)
Platelets: 199 10*3/uL (ref 150–400)
RBC: 4.47 MIL/uL (ref 4.22–5.81)
RBC: 4.57 MIL/uL (ref 4.22–5.81)
RBC: 4.73 MIL/uL (ref 4.22–5.81)
RBC: 4.86 MIL/uL (ref 4.22–5.81)
RBC: 5.07 MIL/uL (ref 4.22–5.81)
RDW: 14 % (ref 11.5–15.5)
RDW: 14.2 % (ref 11.5–15.5)
RDW: 14.6 % (ref 11.5–15.5)
RDW: 14.6 % (ref 11.5–15.5)
RDW: 14.7 % (ref 11.5–15.5)
WBC: 4.7 10*3/uL (ref 4.0–10.5)
WBC: 5.5 10*3/uL (ref 4.0–10.5)
WBC: 5.7 10*3/uL (ref 4.0–10.5)
WBC: 7.7 10*3/uL (ref 4.0–10.5)
WBC: 9.6 10*3/uL (ref 4.0–10.5)

## 2010-08-08 LAB — CANCER ANTIGEN 19-9: CA 19-9: 11.1 U/mL — ABNORMAL LOW (ref <35.0)

## 2010-08-08 LAB — URINALYSIS, ROUTINE W REFLEX MICROSCOPIC
Bilirubin Urine: NEGATIVE
Glucose, UA: 100 mg/dL — AB
Ketones, ur: 15 mg/dL — AB
Leukocytes, UA: NEGATIVE
Nitrite: NEGATIVE
Protein, ur: 100 mg/dL — AB
Specific Gravity, Urine: >1.03 — ABNORMAL HIGH (ref 1.005–1.030)
Urobilinogen, UA: 0.2 mg/dL (ref 0.0–1.0)
pH: 5.5 (ref 5.0–8.0)

## 2010-08-08 LAB — LIPASE, BLOOD: Lipase: 26 U/L (ref 11–59)

## 2010-08-08 LAB — MAGNESIUM
Magnesium: 1.7 mg/dL (ref 1.5–2.5)
Magnesium: 2 mg/dL (ref 1.5–2.5)
Magnesium: 2 mg/dL (ref 1.5–2.5)
Magnesium: 2.1 mg/dL (ref 1.5–2.5)

## 2010-08-08 LAB — URINE MICROSCOPIC-ADD ON

## 2010-08-08 LAB — HEPATIC FUNCTION PANEL
ALT: 52 U/L (ref 0–53)
AST: 38 U/L — ABNORMAL HIGH (ref 0–37)
Albumin: 4.6 g/dL (ref 3.5–5.2)
Alkaline Phosphatase: 47 U/L (ref 39–117)
Bilirubin, Direct: 0.1 mg/dL (ref 0.0–0.3)
Indirect Bilirubin: 0.7 mg/dL (ref 0.3–0.9)
Total Bilirubin: 0.8 mg/dL (ref 0.3–1.2)
Total Protein: 8.5 g/dL — ABNORMAL HIGH (ref 6.0–8.3)

## 2010-08-08 LAB — PHOSPHORUS
Phosphorus: 3 mg/dL (ref 2.3–4.6)
Phosphorus: 3.2 mg/dL (ref 2.3–4.6)
Phosphorus: 3.5 mg/dL (ref 2.3–4.6)
Phosphorus: 3.6 mg/dL (ref 2.3–4.6)

## 2010-08-08 LAB — CEA: CEA: 1.7 ng/mL (ref 0.0–5.0)

## 2010-08-08 LAB — ABO/RH: ABO/RH(D): B POS

## 2010-08-08 LAB — AMYLASE: Amylase: 231 U/L — ABNORMAL HIGH (ref 27–131)

## 2010-08-16 LAB — CBC
HCT: 41.4 % (ref 39.0–52.0)
Hemoglobin: 13.9 g/dL (ref 13.0–17.0)
MCHC: 33.6 g/dL (ref 30.0–36.0)
MCV: 85.9 fL (ref 78.0–100.0)
Platelets: 196 10*3/uL (ref 150–400)
RBC: 4.82 MIL/uL (ref 4.22–5.81)
RDW: 14.9 % (ref 11.5–15.5)
WBC: 9.6 10*3/uL (ref 4.0–10.5)

## 2010-08-16 LAB — COMPREHENSIVE METABOLIC PANEL
ALT: 35 U/L (ref 0–53)
AST: 31 U/L (ref 0–37)
Albumin: 4.3 g/dL (ref 3.5–5.2)
Alkaline Phosphatase: 48 U/L (ref 39–117)
BUN: 14 mg/dL (ref 6–23)
CO2: 27 mEq/L (ref 19–32)
Calcium: 9.8 mg/dL (ref 8.4–10.5)
Chloride: 102 mEq/L (ref 96–112)
Creatinine, Ser: 1.27 mg/dL (ref 0.4–1.5)
GFR calc Af Amer: >60 mL/min (ref >60)
GFR calc non Af Amer: 57 mL/min — ABNORMAL LOW (ref >60)
Glucose, Bld: 111 mg/dL — ABNORMAL HIGH (ref 70–99)
Potassium: 3.6 mEq/L (ref 3.5–5.1)
Sodium: 144 mEq/L (ref 135–145)
Total Bilirubin: 0.3 mg/dL (ref 0.3–1.2)
Total Protein: 7.5 g/dL (ref 6.0–8.3)

## 2010-08-16 LAB — URINALYSIS, ROUTINE W REFLEX MICROSCOPIC
Bilirubin Urine: NEGATIVE
Glucose, UA: NEGATIVE mg/dL
Hgb urine dipstick: NEGATIVE
Ketones, ur: NEGATIVE mg/dL
Nitrite: NEGATIVE
Protein, ur: NEGATIVE mg/dL
Specific Gravity, Urine: >1.03 — ABNORMAL HIGH (ref 1.005–1.030)
Urobilinogen, UA: 0.2 mg/dL (ref 0.0–1.0)
pH: 5 (ref 5.0–8.0)

## 2010-08-16 LAB — DIFFERENTIAL
Basophils Absolute: 0.1 10*3/uL (ref 0.0–0.1)
Basophils Relative: 1 % (ref 0–1)
Eosinophils Absolute: 0.2 10*3/uL (ref 0.0–0.7)
Eosinophils Relative: 2 % (ref 0–5)
Lymphocytes Relative: 28 % (ref 12–46)
Lymphs Abs: 2.7 10*3/uL (ref 0.7–4.0)
Monocytes Absolute: 0.9 10*3/uL (ref 0.1–1.0)
Monocytes Relative: 9 % (ref 3–12)
Neutro Abs: 5.8 10*3/uL (ref 1.7–7.7)
Neutrophils Relative %: 61 % (ref 43–77)

## 2010-08-16 LAB — LIPASE, BLOOD: Lipase: 24 U/L (ref 11–59)

## 2010-08-20 LAB — GLUCOSE, CAPILLARY: Glucose-Capillary: 99 mg/dL (ref 70–99)

## 2010-08-21 ENCOUNTER — Encounter: Payer: Self-pay | Admitting: Gastroenterology

## 2010-08-21 ENCOUNTER — Ambulatory Visit (INDEPENDENT_AMBULATORY_CARE_PROVIDER_SITE_OTHER): Payer: Federal, State, Local not specified - PPO | Admitting: Gastroenterology

## 2010-08-21 VITALS — BP 157/78 | HR 71 | Temp 97.4°F | Ht 69.0 in | Wt 177.2 lb

## 2010-08-21 DIAGNOSIS — K2270 Barrett's esophagus without dysplasia: Secondary | ICD-10-CM | POA: Insufficient documentation

## 2010-08-21 DIAGNOSIS — K219 Gastro-esophageal reflux disease without esophagitis: Secondary | ICD-10-CM

## 2010-08-21 DIAGNOSIS — K529 Noninfective gastroenteritis and colitis, unspecified: Secondary | ICD-10-CM

## 2010-08-21 DIAGNOSIS — R197 Diarrhea, unspecified: Secondary | ICD-10-CM | POA: Insufficient documentation

## 2010-08-21 DIAGNOSIS — K5289 Other specified noninfective gastroenteritis and colitis: Secondary | ICD-10-CM

## 2010-08-21 NOTE — Progress Notes (Signed)
Primary Care Physician:  Alice Reichert, MD  Primary Gastroenterologist:  Dr. Roetta Sessions  Chief Complaint  Patient presents with  . Follow-up    2 yr , ?need colonoscopy    HPI:  Donald Klein is a 65 y.o. male here two-year followup. He has a history of chronic GERD and Barrett's esophagus. He is due for his next EGD in January 2013. Overall his reflux is been doing well. As long as he takes his medicine he has no breakthrough symptoms. Denies any dysphagia, abdominal pain, vomiting. Complains of chronically loose bowels with urgency and occasional incontinence. In the 1980s he had a colonoscopy by Dr. Park Breed which was concerning for ulcerative colitis by pathology. Subsequent colonoscopy in the 90s by Dr. Karilyn Cota not convincing for UC however pathology showed chronic colitis. Patient has never been on chronic treatment. He reports having negative colonoscopies by Dr. Randa Evens, the last one about 5 years ago. He tells me has a history of polyps. He denies any blood in his stools or melena.   Current Outpatient Prescriptions  Medication Sig Dispense Refill  . CRESTOR 20 MG tablet Take 1 tablet by mouth daily.      Marland Kitchen LOVAZA 1 G capsule Take 1 capsule by mouth Twice daily.      . metoprolol (LOPRESSOR) 50 MG tablet Take 1 tablet by mouth Twice daily.      Marland Kitchen omeprazole (PRILOSEC) 20 MG capsule Take 1 tablet by mouth Twice daily.      . ONGLYZA 5 MG TABS tablet Take 1 tablet by mouth daily.         Allergies as of 08/21/2010 - Review Complete 08/21/2010  Allergen Reaction Noted  . Penicillins      Past Medical History  Diagnosis Date  . HTN (hypertension)   . Chronic back pain   . PTSD (post-traumatic stress disorder)   . Diabetes mellitus   . GERD (gastroesophageal reflux disease)   . Barrett esophagus   . Helicobacter pylori gastritis 2009    treated  . Anxiety   . Depression   . Colon polyps   . Colitis     In 1980s, dx with UC by Dr. Welton Flakes at time of TCS, 1992 TCS, chronic  colitis  . 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 Cholecystectomy   . 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 Heart attack Father     61  . Colon cancer Neg Hx     History   Social History  . Marital Status: Married    Spouse Name: N/A    Number of Children: 1  . Years of Education: N/A   Occupational History  . disability    Social History Main Topics  . Smoking status: Never Smoker   . Smokeless tobacco: Never Used  . Alcohol Use: No  . Drug Use: No  . Sexually Active: Yes -- Male partner(s)    Birth Control/ Protection: None     spouse     ROS:  General: Negative for anorexia, weight loss, fever, chills, fatigue, weakness. Eyes: Negative for vision changes.  ENT: Negative for hoarseness, difficulty swallowing , nasal congestion. CV: Negative for chest pain, angina, palpitations, dyspnea on exertion, peripheral edema.  Respiratory: Negative for dyspnea  at rest, dyspnea on exertion, cough, sputum, wheezing.  GI: See history of present illness. GU:  Negative for dysuria, hematuria, urinary incontinence, urinary frequency, nocturnal urination.  MS: Negative for joint pain, low back pain.  Derm: Negative for rash or itching.  Neuro: Negative for weakness, abnormal sensation, seizure, frequent headaches, memory loss, confusion.  Psych: Negative for anxiety, depression, suicidal ideation, hallucinations.  Endo: Negative for unusual weight change.  Heme: Negative for bruising or bleeding. Allergy: Negative for rash or hives.    Physical Examination:  BP 157/78  Pulse 71  Temp 97.4 F (36.3 C)  Ht 5\' 9"  (1.753 m)  Wt 177 lb 3.2 oz (80.377 kg)  BMI 26.17 kg/m2   General: Well-nourished, well-developed in no acute distress.  Head:  Normocephalic, atraumatic.   Eyes: Conjunctiva pink, no icterus. Mouth: Oropharyngeal mucosa moist and pink , no lesions erythema or exudate. Neck: Supple without thyromegaly, masses, or lymphadenopathy.  Lungs: Clear to auscultation bilaterally.  Heart: Regular rate and rhythm, no murmurs rubs or gallops.  Abdomen: Bowel sounds are normal, nontender, nondistended, no hepatosplenomegaly or masses, no abdominal bruits or hernia , no rebound or guarding.   Extremities: No lower extremity edema.  Neuro: Alert and oriented x 4 , grossly normal neurologically.  Skin: Warm and dry, no rash or jaundice.   Psych: Alert and cooperative, normal mood and affect.

## 2010-08-22 ENCOUNTER — Encounter: Payer: Self-pay | Admitting: Gastroenterology

## 2010-08-22 NOTE — Assessment & Plan Note (Signed)
Well controlled on omeprazole twice a day. Continue current regimen.

## 2010-08-22 NOTE — Assessment & Plan Note (Signed)
Continue omeprazole twice a day. EGD as planned in January of 2013

## 2010-08-22 NOTE — Assessment & Plan Note (Signed)
Refer to assessment and plan for diarrhea.

## 2010-08-22 NOTE — Progress Notes (Signed)
Cc PCP 

## 2010-08-22 NOTE — Assessment & Plan Note (Signed)
Chronically loose bowel movements. Complains of increased urgency. Nonbloody stools. Questionable history of ulcerative colitis back in 1980s. Last colonoscopy at least 5 years ago per patient, we are trying to track down that report. Colonoscopy in the near future.  I have discussed the risks, alternatives, benefits with regards to but not limited to the risk of reaction to medication, bleeding, infection, perforation and the patient is agreeable to proceed. Written consent to be obtained.  Patient to bring in medication list prior to scheduling the procedure. Current medications listed may be incomplete.

## 2010-08-23 NOTE — Progress Notes (Signed)
Tried to call pt to verify meds- LMOM

## 2010-09-03 NOTE — Progress Notes (Signed)
Have been unable to reach pt by phone. Will send him a letter asking him to bring medications to office. Pt has not been scheduled for tcs yet.

## 2010-09-05 ENCOUNTER — Telehealth: Payer: Self-pay

## 2010-09-05 NOTE — Telephone Encounter (Signed)
Please schedule tcs with RMR

## 2010-09-05 NOTE — Telephone Encounter (Signed)
Pt brought in med list. meds have been abstracted into the chart. Pt needs to be scheduled for a tcs with RMR. Please advise on meds.

## 2010-09-05 NOTE — Telephone Encounter (Signed)
OK to schedule TCS with RMR. Day of prep: Hold Onglyza. Take Metformin 500mg  BID. Continue ASA.

## 2010-09-06 NOTE — Telephone Encounter (Signed)
LMOM- cdg

## 2010-09-10 NOTE — Telephone Encounter (Signed)
Tried to call pt again, no answer, LMOM- cdg

## 2010-09-11 ENCOUNTER — Other Ambulatory Visit: Payer: Self-pay | Admitting: Internal Medicine

## 2010-09-11 DIAGNOSIS — K529 Noninfective gastroenteritis and colitis, unspecified: Secondary | ICD-10-CM

## 2010-09-11 DIAGNOSIS — Z8719 Personal history of other diseases of the digestive system: Secondary | ICD-10-CM

## 2010-09-11 NOTE — Telephone Encounter (Signed)
Pt is scheduled for 10/08/10

## 2010-09-18 NOTE — H&P (Signed)
NAME:  Donald Klein, Donald Klein             ACCOUNT NO.:  [id number]   MEDICAL RECORD NO.:  [id number]          PATIENT TYPE:  AMB   LOCATION:  DAY                           FACILITY:  [facility]   PHYSICIAN:  R. Roetta Sessions, M.D. DATE OF BIRTH:  [birth year]   DATE OF ADMISSION:  DATE OF DISCHARGE:  LH                              HISTORY & PHYSICAL   REASON FOR VISIT:  Burning and hurting in chest after eating and when  laying down.   PHYSICIAN CO-SIGNING NOTE:  Jonathon Bellows, MD   PRIMARY CARE PHYSICIAN:  Angus G. Renard Matter, MD   HISTORY OF PRESENT ILLNESS:  Donald Klein is a pleasant 65 year old African  American gentleman, previous patient of Dr. Karilyn Cota who presents today  with complaints of burning in his chest after meals and after laying  down, breakthrough heartburn, and followup of Barrett esophagus.  He was  last seen on April 2008.  He has a history of chronic GERD, erosive  gastritis, history of dyspepsia, Barrett esophagus, and adenomatous  polyps.  He states overall he has been doing fairly well.  His weight  has been stable.  He noticed over the past couple of months with the  holidays, he has had increasing problems with some breakthrough  heartburn.  He has also been having difficulties after eating meals  especially larger meals with burning in the epigastrium and up into his  chest and into his back.  Sometimes if he goes to bed, he will wake up  with pain in this area.  He has had no nausea, vomiting, dysphagia, or  aphagia.  His bowel movements have been regular.  No melena or rectal  bleeding, dysuria, hematuria.  He has taken baking soda with good  results.  He was recently advised to increase his omeprazole to 20 mg  b.i.d. and this seems to be helping.  He is also concerned about  following up for his Barrett esophagus.   CURRENT MEDICATIONS:  1. Omeprazole 20 mg b.i.d.  2. Losartan 100 mg daily.  3. Hydrochlorothiazide 25 mg daily.  4. Vardenafil 20 mg half tablet  take p.r.n.  5. Clonazepam 1 mg b.i.d.  6. Metformin 1000 mg b.i.d.  7. Tylenol 1000 mg q.i.d. p.r.n.  8. Aspirin 81 mg daily.  9. Janumet 50/1000 mg b.i.d.  10.Crestor 20 mg daily.  11.Quetiapine fumarate 100 mg nightly.  12.Multivitamin daily.  13.Fenugreek 1220 mg daily.  14.Cinnamon 500 mg daily.  15.Chromium 500 mg daily.   ALLERGIES:  PENICILLIN.   PAST MEDICAL HISTORY:  Post-traumatic stress disorder, exposure to agent  orange, hypertension, chronic back pain requiring epidural injections,  diabetes mellitus, chronic GERD, history of Barrett esophagus.  Last EGD  November 2007 had a short segment biopsy proven Barrett with no  dysplasia, erosive antral gastritis, and prepyloric inflammation with  negative CLO-test felt to be due to NSAID use.  Previously treated for  H. pylori gastritis August 2009.  Colonoscopy in 2003 by Dr. Randa Evens was  normal.  He states he does have a history of polyps in the past.  In  1992, he had  tiny polyps and chronic colitis on colonoscopy by Dr.  Karilyn Cota.  He states that last colonoscopy was in 2008 in [place] and  he is supposed to come back in 5 years.  He does have a history of  Schatzki ring with food impaction in 1994, hypertension, anxiety,  depression, status post cholecystectomy for biliary dyskinesia, cardiac  catheterization with small blockage treated with aspirin per patient.   FAMILY HISTORY:  Mother deceased at age 6 due to suicide, father  deceased at age 37 due to MI.  No family history of colorectal cancer.   SOCIAL HISTORY:  He is married with one child, is on disability, never  been a smoker, no alcohol use.   REVIEW OF SYSTEMS:  See HPI for GI.  CONSTITUTIONAL:  Denies any weight  loss.  CARDIOPULMONARY:  Denies any palpitations, diaphoresis.  No chest  pain, shortness of breath, cough.  GENITOURINARY:  See HPI.   PHYSICAL EXAMINATION:  VITAL SIGNS:  Weight 175, height 5 feet 9 inches,  temp 97.8, blood pressure  130/92, pulse 60.  GENERAL:  Pleasant well-nourished, well-developed African American  gentleman in no acute distress.  SKIN:  Warm and dry.  No jaundice.  HEENT:  Sclerae nonicteric.  Oropharyngeal mucosa moist and pink.  No  lesions, erythema, or exudate.  NECK:  No lymphadenopathy, thyromegaly.  CHEST:  Lungs are clear to auscultation.  CARDIAC:  Regular rate and rhythm.  Normal S1, S2.  No murmurs, rubs, or  gallops.  ABDOMEN:  Positive bowel sounds.  Soft, nontender, nondistended.  No  organomegaly or masses.  No rebound or guarding.  No abdominal bruits or  hernias.  LOWER EXTREMITIES:  No edema.   IMPRESSION:  Donald Klein is a 65 year old gentleman with recent epigastric  pain with burning up into his chest, into his back, especially  postprandially and when he lays down at night.  I suspect refractory  gastroesophageal reflux disease.  Symptoms have already started to  improve with increasing omeprazole to 20 mg b.i.d.  He does have a  history of Barrett esophagus and with ongoing symptoms, we would go  ahead and pursue a surveillance/diagnostic  EGD at this time.  I have discussed risks, alternatives, and benefits  with regards to, but not limited to the risk of reaction to medication,  bleeding, infection, perforation, and he is agreeable to proceed.   PLAN:  EGD with Dr. Jena Gauss in the near future.  We will hold his aspirin  4 days prior to procedure.      Tana Coast, P.AJonathon Bellows, M.D.  Electronically Signed    LL/MEDQ  D:  05/16/2008  T:  05/16/2008  Job:  [id number]   cc:   Angus G. Renard Matter, MD  Fax: [phone]

## 2010-09-18 NOTE — Op Note (Signed)
NAME:  Donald Klein, Donald Klein             ACCOUNT NO.:  [id number]   MEDICAL RECORD NO.:  [id number]          PATIENT TYPE:  AMB   LOCATION:  DAY                           FACILITY:  [facility]   PHYSICIAN:  R. Roetta Sessions, M.D. DATE OF BIRTH:  05-25-1945   DATE OF PROCEDURE:  05/27/2008  DATE OF DISCHARGE:                               OPERATIVE REPORT   PROCEDURE:  Esophagogastroduodenoscopy with biopsy.   INDICATIONS FOR PROCEDURE:  A 65 year old gentleman with recent  epigastric pain and burning up in his chest.  He has history of  gastroesophageal reflux disease and Barrett esophagus.  He just started  on omeprazole 20 mg orally b.i.d.  When he was seen in our office on  May 16, 2008, has been associated with marked improvement and  essentially resolution of the above-mentioned symptoms.  EGD by Dr.  Karilyn Cota back in 2008 demonstrated Barrett esophagus.  He is here for  further evaluation.  Risks, benefits, alternatives, and limitations have  been reviewed and questions answered.  All parties agreeable.   PROCEDURE NOTE:  O2 saturation, blood pressure, pulse, and respirations  were monitored throughout the entire procedure.   CONSCIOUS SEDATION:  Versed 5 mg IV and Demerol 50 mg IV in divided  doses.   INSTRUMENT:  Pentax video chip system.   Cetacaine spray for topical pharyngeal anesthesia.   FINDINGS:  Examination of the tubular esophagus revealed circumferential  salmon-colored epithelium coming up about 1 cm above the squamocolumnar  junction.  There were couple of very tiny 2- to 3-mm tongues coming  above this level.  Please see photos.  There was no evidence of  neoplasia and no esophagitis.  Tubular esophagus was patent throughout  the EGD junction.  Stomach:  Gastric cavity was emptied and insufflated  well with air.  Thorough examination of the gastric mucosa including  retroflexed view of the proximal stomach and esophagogastric junction  demonstrated only a small  hiatal hernia.  Pylorus was patent and easily  traversed.  Examination of the bulb and second portion revealed no  abnormalities.  Therapeutic/diagnostic maneuvers were performed.  Biopsies of the salmon-colored epithelium were taken circumferentially,  multiple specimens for the pathologist, and the patient tolerated the  procedure well and was reacted to Endoscopy.   IMPRESSION:  Salmon-colored epithelium coming up at just 1 cm above,  what appeared to be the squamocolumnar junction, consistent with prior  history of short-segment Barrett esophagus, status post biopsy, small  hiatal hernia; otherwise, normal stomach, D1 and D2.   RECOMMENDATIONS:  1. Continue omeprazole 20 mg orally twice daily for the time being.      Antireflux literature was provided to Mr.      Henkels.  2. Followup on path.  3. Further recommendations to follow.      Jonathon Bellows, M.D.  Electronically Signed     RMR/MEDQ  D:  05/27/2008  T:  05/27/2008  Job:  [id number]   cc:   Angus G. Renard Matter, MD  Fax: [phone]

## 2010-09-21 ENCOUNTER — Other Ambulatory Visit: Payer: Self-pay | Admitting: Gastroenterology

## 2010-09-21 NOTE — Discharge Summary (Signed)
[facility]  Patient:    Donald Klein, HARMON                      MRN: [id number] Adm. Date:  [id number] Disc. Date: 09/06/99 Attending:  Mirian Mo Dictator:   Rozell Searing, P.A. CC:         Butch Penny, M.D.                           Discharge Summary  PROCEDURE:  Cardiac catheterization on Sep 06, 1999.  REASON FOR ADMISSION:  The patient is a 65 year old male, without prior history of CAD, and with cardiac risk factors notable for HTN, hypercholesterolemia, and a family history of heart disease.  He initially presented to [facility] for evaluation of chest pain, and he was subsequently transferred to [facility] for a diagnostic coronary angiogram.  LABORATORY DATA (at [facility]):  CPK 443/3, 375/3.  Troponin I 0.00 and 0.1. Metabolic profile within normal limits.  Elevated glucose 174.  CBC essentially normal.  INR of 1.2.  Lipid profile pending.  CXR ([facility]):  NAD.  HOSPITAL COURSE:  Following transfer from [facility], the patient underwent diagnostic coronary angiogram, revealing nonobstructive CAD with normal left ventricle (see catheterization report for full details). Specifically, Dr. Gerri Spore noted normal LMCA; 30% mid LAD; 50% ostial CFX; and 20% proximal RCA.  Left ventriculogram revealed normal wall motion, calculated EF of 69%, and no MR.  Distal aortogram revealed no atherosclerotic disease.  No medication adjustments made this admission.  The patient was cleared for discharge later that same day.  DISCHARGE MEDICATIONS: 1. Coated aspirin 325 mg q.d. 2. Prevacid 30 mg q.d. 3. Lipitor 10 mg q.d. 4. Norvasc 5 mg q.d.  DISCHARGE INSTRUCTIONS:  The patient is to refrain from any driving, strenuous activity, or sexual activity x 2 days.  He is to call our office if there is any bleeding/swelling of the groin.  DISCHARGE DIET:  He is to maintain a low-fat and low-cholesterol diet.  FOLLOWUP:  The patient will follow up with Dr.  [facility] on Sep 26, 1999 at 10:15 a.m. at [facility].  DISCHARGE DIAGNOSES: 1. Nonischemic chest pain.  Nonobstructive coronary artery disease/normal left    ventricle-cardiac catheterization on Sep 06, 1999. 2. Hypertension. 3. Diabetes mellitus. 4. Hypercholesterolemia. 5. Obesity. DD:  09/06/99 TD:  09/06/99 Job: [id number]

## 2010-09-21 NOTE — Consult Note (Signed)
NAMEHUSSAIN, MAIMONE                         ACCOUNT NO.:  [id number]   MEDICAL RECORD NO.:  [id number]                   PATIENT TYPE:  OUT   LOCATION:  RAD                                  FACILITY:  [facility]   PHYSICIAN:  Lionel December, M.D.                 DATE OF BIRTH:  Mar 25, 1946   DATE OF CONSULTATION:  11/16/2003  DATE OF DISCHARGE:  10/27/2003                                   CONSULTATION   GASTROENTEROLOGY CONSULTATION:   REQUESTING PHYSICIAN:  Angus G. Renard Matter, M.D.   REASON FOR CONSULTATION:  Abdominal pain.   HISTORY OF PRESENT ILLNESS:  Patient is a 65 year old black gentleman,  patient of Dr. Renard Matter, who presents today for further evaluation of  abdominal pain.  For the last 1 month he has had epigastric abdominal pain  which then radiates out into the flank areas.  Pain is intermittent in  nature.  He noticed this particularly when he has an empty stomach but it is  also postprandially as well.  He has a good appetite.  He has no heartburn  as long as he is on his medication for reflux.  Denies any nausea or  vomiting.  He noted these symptoms gradually progress after he has put on 15  pounds in the last several months.  Bowels are moving regularly usually two  daily; denies any melena or rectal bleeding.  He failed to bring a list of  his medications today.  He tells me he is on medication for reflux and was  taking one daily however, recently began taking it b.i.d. at the request of  Dr. Renard Matter.  He has noted no improvement of his symptoms.   CURRENT MEDICATIONS:  Patient to bring a list.  He states that he takes  metformin, two blood pressure pills, Zocor, aspirin, multivitamins, a  medication for reflux as well as anti-anxiolytic medications.   ALLERGIES:  PENICILLIN.   PAST MEDICAL HISTORY:  History of hiatal hernia.  In the remote past he had  a Schatzki B ring status post dilatation after having food impaction in  1994.  History of hypertension, chronic  gastroesophageal reflux disease,  diabetes mellitus.  History of colonic polyps, last colonoscopy by Dr. Carman Ching August 2003 was normal.  History of anxiety and panic disorder along  with post-traumatic stress disorder.  In 1983 he had persistent diarrhea.  He underwent colonoscopy which revealed probable distal ulcerative colitis.  Pathology revealed ulcerative colitis.  The patient has had no problems  since that time, however.  He has a history of CLOtest positive gastritis  back in 1992.  He did not receive any treatment for H. pylori.  Status post  cholecystectomy for biliary dyskinesia.   FAMILY HISTORY:  Mother committed suicide.  Father died of CVA.  No family  history of colorectal cancer or chronic GI illnesses.   SOCIAL HISTORY:  He has been married  for 33 years and has one child.  He is  disabled secondary to [employer]; he has never been a smoker, denies any  alcohol use.   REVIEW OF SYSTEMS:  GI:  Please see HPI.  CARDIOPULMONARY:  Denies any chest  pain or shortness of breath.   PHYSICAL EXAMINATION:  VITAL SIGNS:  Weight 176.5, height 5 feet 8 inches,  blood pressure 100/60, pulse 62.  GENERAL:  Pleasant well-nourished, well-developed black male in no acute  distress.  SKIN:  Warm and dry.  No jaundice.  HEENT:  Conjunctivae are pink.  Sclerae are nonicteric.  Oropharyngeal  mucosa moist and pink.  No lesions, erythema, or exudate.  No  lymphadenopathy or thyromegaly.  CHEST:  Lungs are clear to auscultation.  CARDIOVASCULAR:  Regular rate and rhythm.  Normal S1, S2.  No murmurs, rubs,  or gallops.  ABDOMEN:  Positive bowel sounds, soft, nondistended.  He has mild epigastric  tenderness and right upper quadrant tenderness to deep palpation.  No  organomegaly or masses.  EXTREMITIES:  No edema.   ULTRASOUND:  Abdominal ultrasound from October 27, 2003 revealed diffuse fatty  infiltration of the liver, ectatic proximal abdominal aorta measuring 3.2  cm.    IMPRESSION:  1. Patient is a 65 year old gentleman with approximately 78-month history of     epigastric abdominal pain which radiates into the flank areas.  He has a     history of hiatal hernia.  Symptoms have been progressively worsening     with weight gain.  Symptoms could be secondary to poorly controlled     reflux, gastritis, peptic ulcer disease.  Initially we would like to     proceed with upper endoscopy prior to further evaluation.  In addition he     has evidence of fatty infiltration of the liver on ultrasound.  We will     need to further evaluate initially with LFTs.  2. Typical gastroesophageal reflux disease symptoms well controlled with     medications.   PLAN:  1. Patient is to bring a list of his medications prior to any change in     medical treatment.  2. We will schedule an upper endoscopy for further evaluation of symptoms     after he brings his list of medications.  3. If upper endoscopy is unremarkable we will consider a CT as next step in     evaluation.  4. CBC, LFTs, amylase and lipase.   I would like to thank Dr. Renard Matter for allowing [place] to take part in the care  of this patient.     ________________________________________  ___________________________________________  Tana Coast, P.A.                         Lionel December, M.D.   LL/MEDQ  D:  11/16/2003  T:  11/16/2003  Job:  [id number]   cc:   Angus G. Renard Matter, M.D.  [address]  [place]  [place] [address]  Fax: [phone]   Lionel December, M.D.  [address]  [place]  [place] [address]  Fax: [phone]

## 2010-09-21 NOTE — Cardiovascular Report (Signed)
[facility]  Patient:    Donald Klein, Donald Klein                      MRN: [id number] Proc. Date: 09/06/99 Adm. Date:  [id number] Disc. Date: [id number] Attending:  Mirian Mo CC:         Butch Penny, M.D.             Thomas C. Wall, M.D. [facility]             Cardiac Catheterization Laboratory                        Cardiac Catheterization  PROCEDURES PERFORMED:  Left heart catheterization with coronary angiography, left ventriculography, and abdominal aortogram.  INDICATIONS:  Mr. Colson is a 65 year old male with multiple cardiovascular risk factors.  He was admitted with chest and arm pain and was referred for cardiac catheterization.  DESCRIPTION OF PROCEDURE:  A 6-French sheath was placed in the right femoral artery.  Standard Judkins 6-French catheters were utilized.  Contrast was Omnipaque.  There were no complications.  RESULTS:  HEMODYNAMICS:  Left ventricular pressure 146/15.  Aortic pressure 140/74.  No aortic valve gradient.  LEFT VENTRICULOGRAM:  Wall motion is normal.  Ejection fraction is calculated at 69%.  No mitral regurgitation.  ABDOMINAL AORTOGRAM:  This reveals normal abdominal aorta, renal arteries, and iliac arteries.  CORONARY ARTERIOGRAPHY (Right dominant): 1. The left main is normal.  2. The left anterior descending artery has a diffuse 30% stenosis in the mid    vessel.  The LAD gives rise to three small diagonal branches.  3. The left circumflex has a tubular 50% stenosis at its ostium.  It gives    rise to a small OM-1, small OM-2, and a large OM-3.  4. The right coronary artery has a 20% stenosis proximally.  It gives rise to    a normal sized posterior descending artery, a normal first posterolateral    branch, and a small second posterolateral branch.  IMPRESSIONS: 1. Normal left ventricular systolic function. 2. Nonobstructive coronary artery disease. DD:  09/06/99 TD:  09/08/99 Job:  [id number]

## 2010-09-25 NOTE — Telephone Encounter (Signed)
Called to [place] at [employer]

## 2010-10-08 ENCOUNTER — Ambulatory Visit (HOSPITAL_COMMUNITY)
Admission: RE | Admit: 2010-10-08 | Discharge: 2010-10-08 | Disposition: A | Discharge status: Home / Self Care | Payer: Medicare Other | Source: Ambulatory Visit | Attending: Internal Medicine | Admitting: Internal Medicine

## 2010-10-08 ENCOUNTER — Encounter: Payer: Federal, State, Local not specified - PPO | Admitting: Internal Medicine

## 2010-10-08 ENCOUNTER — Other Ambulatory Visit: Payer: Self-pay | Admitting: Internal Medicine

## 2010-10-08 DIAGNOSIS — I10 Essential (primary) hypertension: Secondary | ICD-10-CM | POA: Insufficient documentation

## 2010-10-08 DIAGNOSIS — Z79899 Other long term (current) drug therapy: Secondary | ICD-10-CM | POA: Insufficient documentation

## 2010-10-08 DIAGNOSIS — R197 Diarrhea, unspecified: Secondary | ICD-10-CM | POA: Insufficient documentation

## 2010-10-08 DIAGNOSIS — Z860100 Personal history of colon polyps, unspecified: Secondary | ICD-10-CM | POA: Insufficient documentation

## 2010-10-08 DIAGNOSIS — D126 Benign neoplasm of colon, unspecified: Secondary | ICD-10-CM | POA: Insufficient documentation

## 2010-10-08 DIAGNOSIS — E785 Hyperlipidemia, unspecified: Secondary | ICD-10-CM | POA: Insufficient documentation

## 2010-10-08 DIAGNOSIS — E119 Type 2 diabetes mellitus without complications: Secondary | ICD-10-CM | POA: Insufficient documentation

## 2010-10-08 HISTORY — PX: COLONOSCOPY: SHX174

## 2010-10-08 HISTORY — DX: Benign neoplasm of colon, unspecified: D12.6

## 2010-10-08 LAB — GLUCOSE, CAPILLARY: Glucose-Capillary: 167 mg/dL — ABNORMAL HIGH (ref 70–99)

## 2010-10-10 DIAGNOSIS — R197 Diarrhea, unspecified: Secondary | ICD-10-CM

## 2010-10-10 DIAGNOSIS — Z860100 Personal history of colon polyps, unspecified: Secondary | ICD-10-CM

## 2010-10-10 DIAGNOSIS — D126 Benign neoplasm of colon, unspecified: Secondary | ICD-10-CM

## 2010-10-11 ENCOUNTER — Encounter: Payer: Self-pay | Admitting: Internal Medicine

## 2010-11-19 NOTE — Op Note (Signed)
  NAME:  Donald Klein, Donald Klein             ACCOUNT NO.:  [id number]  MEDICAL RECORD NO.:  [id number]  LOCATION:  DAYP                          FACILITY:  [facility]  PHYSICIAN:  R. Roetta Sessions, MD FACP FACGDATE OF BIRTH:  [birth year]  DATE OF PROCEDURE:  10/08/2010 DATE OF DISCHARGE:                              OPERATIVE REPORT   PROCEDURE:  Colonoscopy with biopsy.  INDICATIONS FOR PROCEDURE:  Patient is a pleasant 65-year gentleman with history of colonic polyps and history of chronic intermittent diarrhea and a distant sketchy history of ulcerative colitis.  The patient does complain of some diarrhea, but denies more than three stools daily, although many times are loose, has not had any blood per rectum. Colonoscopy is now being done.  Risks, benefits, limitations, alternatives, imponderables have been discussed, questions answered. Please see the documentation in the medical record.  PROCEDURE NOTE:  O2 saturation, blood pressure, pulse, respirations were monitored throughout the entire procedure.  CONSCIOUS SEDATION:  Versed 4 mg IV, Demerol 100 mg IV in divided doses.  INSTRUMENT:  Pentax video chip system.  FINDINGS:  Digital rectal exam revealed no abnormalities.  Endoscopic findings:  Prep was adequate.  Colon:  Colonic mucosa was surveyed from the rectosigmoid junction through the left transverse right colon to the appendiceal orifice, ileocecal valve/cecum.  These structures were well seen and photographed for the record.  I attempted to intubate the terminal ileum and was unable to do so.  There was a bit of an obtuse angle approaching the orifice, ileocecal valve looked entirely normal otherwise change patient from  left lateral decubitus to right side down, I get a better angle, but I was just simply unable to get in. From this level, scope was slowly withdrawn.  All previously mentioned mucosal surfaces were again seen.  The patient had a single diminutive polyp at the  base of cecum which was cold biopsied/removed.  The remainder of colonic mucosa appeared endoscopically normal.  I did biopsy the ascending, descending segments to rule out microscopic colitis.  The scope was pulled down the rectum where a thorough examination rectal mucosa including retroflexion of the anal verge demonstrated no abnormalities, status post biopsy of the rectal mucosa as well.  The patient tolerated the procedure well.  Cecal withdrawal time 11 minutes.  IMPRESSION: 1. Normal rectum. 2. Diminutive cecal polyp with status post cold biopsy removal, status     post segmental biopsy, unable to intubate terminal ileum.  RECOMMENDATIONS: 1. Follow up on pending studies. 2. Further recommendations to follow.     Jonathon Bellows, MD FACP [facility]     RMR/MEDQ  D:  10/08/2010  T:  10/08/2010  Job:  [id number]  cc:   Angus G. Renard Matter, MD Fax: [phone]  Electronically Signed by Lorrin Goodell M.D. on 11/19/2010 08:36:59 AM

## 2010-11-28 ENCOUNTER — Ambulatory Visit: Payer: Medicare Other | Admitting: Gastroenterology

## 2010-12-11 ENCOUNTER — Encounter: Payer: Self-pay | Admitting: Urgent Care

## 2010-12-12 ENCOUNTER — Ambulatory Visit (INDEPENDENT_AMBULATORY_CARE_PROVIDER_SITE_OTHER): Payer: Medicare Other | Admitting: Urgent Care

## 2010-12-12 ENCOUNTER — Encounter: Payer: Self-pay | Admitting: Urgent Care

## 2010-12-12 DIAGNOSIS — K2270 Barrett's esophagus without dysplasia: Secondary | ICD-10-CM

## 2010-12-12 DIAGNOSIS — D126 Benign neoplasm of colon, unspecified: Secondary | ICD-10-CM

## 2010-12-12 DIAGNOSIS — K219 Gastro-esophageal reflux disease without esophagitis: Secondary | ICD-10-CM

## 2010-12-12 DIAGNOSIS — R197 Diarrhea, unspecified: Secondary | ICD-10-CM

## 2010-12-12 NOTE — Assessment & Plan Note (Signed)
Next EGD 05/2011.  Will have OV prior to EGD.

## 2010-12-12 NOTE — Progress Notes (Signed)
Reminder in epic to have EGD in 05/2011 and tcs in 2017

## 2010-12-12 NOTE — Assessment & Plan Note (Signed)
Resolved

## 2010-12-12 NOTE — Progress Notes (Signed)
Referring Provider: Alice Reichert, MD Primary Care Physician:  Alice Reichert, MD Primary Gastroenterologist:  Dr. Jena Gauss  Chief Complaint  Patient presents with  . Follow-up    doing ok    HPI:  Donald Klein is a 65 y.o. male here for follow up for diarrhea, GERD, Barrett's esophagus, & tubular adenoma found on recent colonoscopy. Taking prilosec 20mg  daily & occasionally BID couple times a mo. "Usually only when I eat something I am not supposed to eat."  Trying to lose wt.  Admits to not taking his anti-hypertensive as directed.   Denies any lower GI symptoms including constipation, diarrhea, rectal bleeding, melena or weight loss.   Denies any upper GI symptoms including heartburn, indigestion, nausea, vomiting, dysphagia, odynophagia or anorexia.  Past Medical History  Diagnosis Date  . HTN (hypertension)   . Chronic back pain   . PTSD (post-traumatic stress disorder)   . Diabetes mellitus   . GERD (gastroesophageal reflux disease)   . Barrett esophagus     Last EGD 05/27/08 Barrett's without dyplasia. EGD due 05/2011  . Helicobacter pylori gastritis 2009    treated  . Anxiety   . Depression   . Tubular adenoma of colon 10/08/10    Last colonosocpy 1 cecal TA  . Colitis     In 1980s, dx with UC by Dr. Welton Flakes at time of TCS, 1992 TCS, chronic colitis  . 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 S/P colonoscopy 10/08/10    normal random bx, TA cecum   Past Surgical History  Procedure Date  . Cholecystectomy   . 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 amLODipine (NORVASC) 10 MG tablet Take 10 mg by mouth Daily. Take 1/2 daily      . aspirin 81 MG tablet Take 81 mg by mouth daily.        . CRESTOR 20 MG tablet Take 1 tablet by mouth daily.      . hydrochlorothiazide 25 MG tablet  Take 25 mg by mouth daily.        Marland Kitchen losartan (COZAAR) 100 MG tablet Take 100 mg by mouth daily.        Marland Kitchen LOVAZA 1 G capsule Take 1 capsule by mouth Twice daily.      . metFORMIN (GLUCOPHAGE) 1000 MG tablet Take 1,000 mg by mouth 2 (two) times daily with a meal.        . metoprolol (LOPRESSOR) 50 MG tablet Take 1 tablet by mouth Twice daily.      Marland Kitchen omeprazole (PRILOSEC) 20 MG capsule TAKE ONE CAPSULE BY MOUTH . BEFORE BREAKFAST AND EVENING MEAL DAILY  60 capsule  5  . ONGLYZA 5 MG TABS tablet Take 1 tablet by mouth daily.       Allergies as of 12/12/2010 - Review Complete 12/12/2010  Allergen Reaction Noted  . Penicillins     Review of Systems: Gen: Denies any fever, chills, sweats, anorexia, fatigue, weakness, malaise, weight loss, and sleep disorder CV: Denies chest pain, angina, palpitations, syncope, orthopnea, PND, peripheral edema, and claudication. Resp: Denies dyspnea at rest, dyspnea with exercise, cough, sputum, wheezing, coughing up blood, and pleurisy. GI: Denies vomiting blood, jaundice, and fecal incontinence.   Denies dysphagia or odynophagia. Derm: Denies rash, itching, dry skin, hives, moles, warts, or unhealing ulcers.  Psych: Denies depression, anxiety, memory loss, suicidal ideation, hallucinations, paranoia, and confusion. Heme: Denies bruising, bleeding, and enlarged lymph nodes.  Physical Exam: BP 150/88  Pulse 64  Temp(Src) 97 F (36.1 C) (Temporal)  Ht 5\' 9"  (1.753 m)  Wt 178 lb (80.74 kg)  BMI 26.29 kg/m2 General:   Alert,  Well-developed, well-nourished, pleasant and cooperative in NAD Head:  Normocephalic and atraumatic. Eyes:  Sclera clear, no icterus.   Conjunctiva pink. Mouth:  No deformity or lesions, dentition normal. Neck:  Supple; no masses or thyromegaly. Heart:  Regular rate and rhythm; no murmurs, clicks, rubs,  or gallops. Abdomen:  Soft, nontender and nondistended. No masses, hepatosplenomegaly or hernias noted. Normal bowel sounds,  without guarding, and without rebound.   Msk:  Symmetrical without gross deformities. Normal posture. Pulses:  Normal pulses noted. Extremities:  Without clubbing or edema. Neurologic:  Alert and  oriented x4;  grossly normal neurologically. Skin:  Intact without significant lesions or rashes. Cervical Nodes:  No significant cervical adenopathy. Psych:  Alert and cooperative. Normal mood and affect.

## 2010-12-12 NOTE — Progress Notes (Signed)
Cc to PCP 

## 2010-12-12 NOTE — Assessment & Plan Note (Signed)
Well-controlled on omeprazole 20mg  daily, rare breakthrough.

## 2010-12-12 NOTE — Patient Instructions (Addendum)
Please take your BP pills & Fu w/  Alice Reichert, MD for this. Repeat EGD Jan 2013  Barrett's Esophagus The esophagus is the muscular tube that carries food and saliva from the mouth to the stomach. Barrett's esophagus involves changes in the esophagus. Some of its lining is replaced by a type of tissue similar to that found in the intestine. This process is called intestinal metaplasia. While Barrett's esophagus may cause no symptoms itself, a small number of people with this condition develop a relatively rare but often deadly type of cancer of the esophagus. It is called esophageal adenocarcinoma. Barrett's esophagus is associated with the common condition called GERD (gastroesophageal reflux disease). HOW THE ESOPHAGUS WORKS The esophagus carries food, liquids, and saliva from the mouth to the stomach. The stomach acts as a container to start digestion and pump food and liquids into the intestines in a controlled process. Food can then be properly digested over time. Nutrients can be taken in (absorbed) by the intestines. The esophagus moves food to the stomach by coordinated contractions of its muscular lining. This process is automatic. People are usually not aware of it. Many people have felt their esophagus when they:  Swallow something too large.   Try to eat too quickly.   Drink very hot or cold liquids.  They then feel the movement of the food or drink down the esophagus into the stomach. This may be an uncomfortable feeling. DIGESTIVE TRACT The muscular layers of the esophagus are normally pinched together at both the upper and lower ends by muscles. These muscles are called sphincters. When a person swallows, the sphincters relax automatically. This allows food or drink to pass from the mouth, into the stomach. The muscles then close rapidly. This prevents the swallowed food or drink from leaking out of the stomach, back into the esophagus or into the mouth. These muscles make it  possible to swallow while lying down or even upside-down. When people belch to release swallowed air or gas from carbonated beverages, the sphincters relax. Then small amounts of food or drink may come back up, briefly. This condition is called reflux. The esophagus quickly squeezes the material back into the stomach. This is considered normal. These functions of the esophagus are an important part of everyday life. However, people who must have their esophagus removed, for example because of cancer, can live a relatively healthy life without it. GERD (GASTROESOPHAGEAL REFLUX DISEASE) Having some stomach contents (liquids or gas) sometimes reflux (come back up from the stomach into the esophagus) is considered normal. When it happens often, and causes other symptoms, it is considered a medical problem or disease. However, it is not necessarily a serious one or one that requires seeing a caregiver. The stomach produces acid and enzymes to digest food. When this mixture refluxes into the esophagus more often than normal or for a longer period of time than normal, it may produce symptoms. These symptoms are often called acid reflux. They are often described by people as heartburn, indigestion, or "gas". The symptoms often consist of a burning sensation below and behind the lower part of the breastbone or sternum. Almost everyone has experienced these symptoms at least once. This is typically a result of overeating. Other things that provoke GERD symptoms include:  Being overweight.   Eating certain types of foods.   Being pregnant.  In most people, GERD symptoms may last only a short time and require no treatment at all.  More continual symptoms are often  quickly relieved by over-the-counter acid-reducing agents, such as antacids. Other drugs used to relieve GERD symptoms are antisecretory drugs, such as histamine2 (H2) blockers or proton pump inhibitors. People who have symptoms often should talk with  their caregiver. Other diseases can have similar symptoms. Prescription medicines, together with other actions, might be needed to reduce reflux. GERD that is untreated over a long period can lead to problems. An example is an ulcer in the esophagus, that could cause bleeding. Another common problem is scar tissue that blocks the movement of swallowed food and drink through the esophagus. This condition is called stricture. Esophageal reflux may also cause certain less common symptoms. These include hoarseness or chronic cough. It sometimes provokes conditions such as asthma. While most patients find that lifestyle changes and acid-blocking drugs relieve their symptoms, caregivers sometimes advise surgery. Overall, GERD is one of the most common medical conditions. About 20 percent of the population can be affected over a lifetime. GERD AND BARRETT'S ESOPHAGUS The exact causes of Barrett's esophagus are not known. It is thought to be caused in part by the same factors that cause GERD. People who do not have heartburn can have Barrett's esophagus. However, it is found about 3 to 5 times more often in people with this condition. Barrett's esophagus is uncommon in children. The average age at diagnosis is 90. But it is usually difficult to know when the problem started. It is about twice as common in men as in women. It is much more common in white men than in men of other races. BARRETT'S ESOPHAGUS AND CANCER OF THE ESOPHAGUS Barrett's esophagus does not cause symptoms itself. However, it seems to precede the development of a particular kind of cancer. This cancer is esophageal adenocarcinoma. The risk of developing this cancer is 30 to 125 times higher in people who have Barrett's esophagus than in people who do not. This type of cancer is increasing quickly in white men. The increase is possibly related to the rise in obesity and GERD. For people who have Barrett's esophagus, the risk of getting cancer of the  esophagus is small. It is less than 1 percent (0.4 percent to 0.5 percent) per year. Esophageal adenocarcinoma is often not curable. This is partly because the disease is often discovered at a late stage and treatments are not effective. DIAGNOSIS (HOW TO TELL WHAT IS WRONG) AND SCREENING Diagnosing Barrett's esophagus is not easy. At the present time it cannot be diagnosed based on symptoms, physical exam, or blood tests. The only useful test is upper gastrointestinal endoscopy and biopsy. In this procedure, a flexible tube called an endoscope is used. This tool is like a pencil sized flexible telescope. It has a light and tiny camera. It is passed into the esophagus. If the tissue appears suspicious to your caregiver, biopsies must be done. A biopsy is the removal of a small piece of tissue. This is done using a pincher-like device passed through the endoscope. A pathologist is a specialist who examines body tissue samples. He or she examines the tissue under a microscope to confirm the diagnosis. Looking for a medical problem in people who do not know whether they have one is called screening. Currently, there are no commonly accepted guidelines on who should have endoscopy, to check for Barrett's esophagus. There are many reasons for the lack of firm recommendations about screening. Among them are the great cost and occasional risk of side effects of the test. Also, the rate of finding Barrett's esophagus  is low. Finding the problem early has not been proven to prevent deaths from cancer. Many caregivers advise that adult patients who are over the age of 43 and have had GERD symptoms for a number of years have endoscopy, to see whether they have Barrett's esophagus. Screening for this condition in people who have no symptoms is not advised. TREATMENT Barrett's esophagus has no cure, other than surgical removal of the esophagus. This is a serious operation. Surgery is advised only for people who have a high  risk of developing cancer or who already have it. Most caregivers recommend treating GERD with acid-blocking drugs. This is sometimes linked to improvement in the extent of the Barrett's tissue. But this approach has not been proven to reduce the risk of cancer. Treating reflux with surgery for GERD also does not seem to cure Barrett's esophagus. Several experimental approaches are under study. One attempts to see whether destroying the Barrett's tissue by heat or other means, through an endoscope, can get rid of the condition. But this approach has potential risks and unknown effectiveness. LOOKING FOR DYSPLASIA AND CANCER Occasional endoscopic examinations to look for early warning signs of cancer are generally advised for people who have Barrett's esophagus. This approach is called surveillance. When people who have Barrett's esophagus develop cancer, the process seems to go through an intermediate stage. In this stage cancer cells appear in the Barrett's tissue. This condition is called dysplasia. It can be seen only in biopsies with a microscope. The process is patchy and cannot be seen directly through the endoscope. So, multiple biopsies must be taken. Even then, it can be missed. The process of change from Barrett's to cancer seems to happen only in a few patients. It is less than 1 percent per year. And it happens over a relatively long period of time. Most caregivers advise that patients with Barrett's esophagus undergo occasional endoscopy to have biopsies. The recommended time between endoscopies varies depending on circumstances. The best time interval has not been decided. TREATMENT FOR DYSPLASIA OR ESOPHAGEAL ADENOCARCINOMA If a person with Barrett's esophagus is found to have dysplasia or cancer, the caregiver will usually recommend surgery. This is if the person is strong enough and has a good chance of being cured. The type of surgery may vary. But it usually involves removing most of the  esophagus and pulling the stomach up into the chest to attach it to what remains of the esophagus. Many patients with Barrett's esophagus are elderly. They may have many other medical problems that make surgery unwise. In these patients, other methods to treat dysplasia are being studied. HOPE THROUGH RESEARCH Many important questions about Barrett's esophagus need further research to:  Find better ways to identify people who have the problem.   Find out what causes it.   Test treatments that may prevent or get rid of it.   Find better treatments for people who have Barrett's esophagus with cancer.  The [employer], and the [employer], sponsor research programs to study Barrett's esophagus. IMPORTANT POINTS TO REMEMBER  In Barrett's esophagus, the cells lining the esophagus change. They become similar to the cells lining the intestine.   Barrett's esophagus is connected with gastroesophageal reflux disease or GERD.   A small number of people with Barrett's esophagus may develop esophageal cancer.   Barrett's esophagus is diagnosed by upper gastrointestinal endoscopy and biopsy.   People who have Barrett's esophagus should have periodic esophageal exams.  Taking acid-blocking drugs for GERD may help improve Barrett's esophagus.   Removal of the esophagus is recommended only for people who have a high risk of developing cancer or who already have it.  ADDITIONAL RESOURCES For more information about GERD or Barrett's esophagus, contact: [employer] [address] [place], [place] [address] Phone: [phone] or [phone] Fax: [phone] Email: [email or web address] Internet: [email or web address].[facility] [employer] [facility] [address] [place], [place] [address] Email: [email or web address]@[email or web address] Document Released: 07/13/2003  Document Re-Released: 10/10/2009 [facility] Patient Information 2011 [place], [place].  Polyps, Colon  A polyp is extra tissue that grows inside your body. Colon polyps grow in the large intestine. The large intestine, also called the colon, is part of your digestive system. It is a long, hollow tube at the end of your digestive tract where your body makes and stores stool. Most polyps are not dangerous. They are benign. This means they are not cancerous. But over time, some types of polyps can turn into cancer. Polyps that are smaller than a pea are usually not harmful. But larger polyps could someday become or may already be cancerous. To be safe, doctors remove all polyps and test them.  WHO GETS POLYPS? Anyone can get polyps, but certain people are more likely than others. You may have a greater chance of getting polyps if:  You are over 50.   You have had polyps before.   Someone in your family has had polyps.   Someone in your family has had cancer of the large intestine.   Find out if someone in your family has had polyps. You may also be more likely to get polyps if you:   Eat a lot of fatty foods   Smoke   Drink alcohol   Do not exercise  Eat too much  SYMPTOMS Most small polyps do not cause symptoms. People often do not know they have one until their caregiver finds it during a regular checkup or while testing them for something else. Some people do have symptoms like these:  Bleeding from the anus. You might notice blood on your underwear or on toilet paper after you have had a bowel movement.   Constipation or diarrhea that lasts more than a week.   Blood in the stool. Blood can make stool look black or it can show up as red streaks in the stool.  If you have any of these symptoms, see your caregiver. HOW DOES THE DOCTOR TEST FOR POLYPS? The doctor can use four tests to check for polyps:  Digital rectal exam. The caregiver wears gloves and checks your rectum (the last  part of the large intestine) to see if it feels normal. This test would find polyps only in the rectum. Your caregiver may need to do one of the other tests listed below to find polyps higher up in the intestine.   Barium enema. The caregiver puts a liquid called barium into your rectum before taking x-rays of your large intestine. Barium makes your intestine look white in the pictures. Polyps are dark, so they are easy to see.   Sigmoidoscopy. With this test, the caregiver can see inside your large intestine. A thin flexible tube is placed into your rectum. The device is called a sigmoidoscope, which has a light and a tiny video camera in it. The caregiver uses the sigmoidoscope to look at the last third of your large intestine.   Colonoscopy. This test is like sigmoidoscopy, but  the caregiver looks at all of the large intestine. It usually requires sedation. This is the most common method for finding and removing polyps.  TREATMENT  The caregiver will remove the polyp during sigmoidoscopy or colonoscopy. The polyp is then tested for cancer.   If you have had polyps, your caregiver may want you to get tested regularly in the future.  PREVENTION There is not one sure way to prevent polyps. You might be able to lower your risk of getting them if you:  Eat more fruits and vegetables and less fatty food.   Do not smoke.   Avoid alcohol.   Exercise every day.   Lose weight if you are overweight.   Eating more calcium and folate can also lower your risk of getting polyps. Some foods that are rich in calcium are milk, cheese, and broccoli. Some foods that are rich in folate are chickpeas, kidney beans, and spinach.   Aspirin might help prevent polyps. Studies are under way.  Document Released: 01/17/2004 Document Re-Released: 10/10/2009 [facility] Patient Information 2011 [place], [place].

## 2010-12-14 NOTE — Progress Notes (Signed)
agree

## 2011-02-08 LAB — URINALYSIS, ROUTINE W REFLEX MICROSCOPIC
Bilirubin Urine: NEGATIVE
Glucose, UA: NEGATIVE
Hgb urine dipstick: NEGATIVE
Ketones, ur: NEGATIVE
Nitrite: NEGATIVE
Protein, ur: NEGATIVE
Specific Gravity, Urine: >1.03 — ABNORMAL HIGH
Urobilinogen, UA: 0.2
pH: 5.5

## 2011-04-23 ENCOUNTER — Encounter: Payer: Self-pay | Admitting: Internal Medicine

## 2011-07-04 ENCOUNTER — Ambulatory Visit (HOSPITAL_COMMUNITY)
Admission: RE | Admit: 2011-07-04 | Discharge: 2011-07-04 | Disposition: A | Discharge status: Home / Self Care | Payer: Medicare Other | Source: Ambulatory Visit | Attending: Family Medicine | Admitting: Family Medicine

## 2011-07-04 ENCOUNTER — Other Ambulatory Visit (HOSPITAL_COMMUNITY): Payer: Self-pay | Admitting: Family Medicine

## 2011-07-04 DIAGNOSIS — R52 Pain, unspecified: Secondary | ICD-10-CM

## 2011-07-04 DIAGNOSIS — M25519 Pain in unspecified shoulder: Secondary | ICD-10-CM | POA: Insufficient documentation

## 2011-07-04 DIAGNOSIS — M542 Cervicalgia: Secondary | ICD-10-CM | POA: Insufficient documentation

## 2011-07-12 ENCOUNTER — Encounter: Payer: Self-pay | Admitting: Internal Medicine

## 2011-07-15 ENCOUNTER — Ambulatory Visit (INDEPENDENT_AMBULATORY_CARE_PROVIDER_SITE_OTHER): Payer: Medicare Other | Admitting: Urgent Care

## 2011-07-15 ENCOUNTER — Encounter: Payer: Self-pay | Admitting: Urgent Care

## 2011-07-15 DIAGNOSIS — D126 Benign neoplasm of colon, unspecified: Secondary | ICD-10-CM

## 2011-07-15 DIAGNOSIS — K2270 Barrett's esophagus without dysplasia: Secondary | ICD-10-CM

## 2011-07-15 DIAGNOSIS — K219 Gastro-esophageal reflux disease without esophagitis: Secondary | ICD-10-CM

## 2011-07-15 NOTE — Patient Instructions (Signed)
You need an EGD w/ Dr Jena Gauss Hold your diabetes the night before your test & morning of procedure.  You may take after your procedure.  Barrett's Esophagus The esophagus is the muscular tube that carries food and saliva from the mouth to the stomach. Barrett's esophagus involves changes in the esophagus. Some of its lining is replaced by a type of tissue similar to that found in the intestine. This process is called intestinal metaplasia. While Barrett's esophagus may cause no symptoms itself, a small number of people with this condition develop a relatively rare but often deadly type of cancer of the esophagus. It is called esophageal adenocarcinoma. Barrett's esophagus is associated with the common condition called GERD (gastroesophageal reflux disease). HOW THE ESOPHAGUS WORKS The esophagus carries food, liquids, and saliva from the mouth to the stomach. The stomach acts as a container to start digestion and pump food and liquids into the intestines in a controlled process. Food can then be properly digested over time. Nutrients can be taken in (absorbed) by the intestines. The esophagus moves food to the stomach by coordinated contractions of its muscular lining. This process is automatic. People are usually not aware of it. Many people have felt their esophagus when they:  Swallow something too large.   Try to eat too quickly.   Drink very hot or cold liquids.  They then feel the movement of the food or drink down the esophagus into the stomach. This may be an uncomfortable feeling. DIGESTIVE TRACT The muscular layers of the esophagus are normally pinched together at both the upper and lower ends by muscles. These muscles are called sphincters. When a person swallows, the sphincters relax automatically. This allows food or drink to pass from the mouth, into the stomach. The muscles then close rapidly. This prevents the swallowed food or drink from leaking out of the stomach, back into the  esophagus or into the mouth. These muscles make it possible to swallow while lying down or even upside-down. When people belch to release swallowed air or gas from carbonated beverages, the sphincters relax. Then small amounts of food or drink may come back up, briefly. This condition is called reflux. The esophagus quickly squeezes the material back into the stomach. This is considered normal. These functions of the esophagus are an important part of everyday life. However, people who must have their esophagus removed, for example because of cancer, can live a relatively healthy life without it. GERD (GASTROESOPHAGEAL REFLUX DISEASE) Having some stomach contents (liquids or gas) sometimes reflux (come back up from the stomach into the esophagus) is considered normal. When it happens often, and causes other symptoms, it is considered a medical problem or disease. However, it is not necessarily a serious one or one that requires seeing a caregiver. The stomach produces acid and enzymes to digest food. When this mixture refluxes into the esophagus more often than normal or for a longer period of time than normal, it may produce symptoms. These symptoms are often called acid reflux. They are often described by people as heartburn, indigestion, or "gas". The symptoms often consist of a burning sensation below and behind the lower part of the breastbone or sternum. Almost everyone has experienced these symptoms at least once. This is typically a result of overeating. Other things that provoke GERD symptoms include:  Being overweight.   Eating certain types of foods.   Being pregnant.  In most people, GERD symptoms may last only a short time and require no treatment  at all.  More continual symptoms are often quickly relieved by over-the-counter acid-reducing agents, such as antacids. Other drugs used to relieve GERD symptoms are antisecretory drugs, such as histamine2 (H2) blockers or proton pump  inhibitors. People who have symptoms often should talk with their caregiver. Other diseases can have similar symptoms. Prescription medicines, together with other actions, might be needed to reduce reflux. GERD that is untreated over a long period can lead to problems. An example is an ulcer in the esophagus, that could cause bleeding. Another common problem is scar tissue that blocks the movement of swallowed food and drink through the esophagus. This condition is called stricture. Esophageal reflux may also cause certain less common symptoms. These include hoarseness or chronic cough. It sometimes provokes conditions such as asthma. While most patients find that lifestyle changes and acid-blocking drugs relieve their symptoms, caregivers sometimes advise surgery. Overall, GERD is one of the most common medical conditions. About 20 percent of the population can be affected over a lifetime. GERD AND BARRETT'S ESOPHAGUS The exact causes of Barrett's esophagus are not known. It is thought to be caused in part by the same factors that cause GERD. People who do not have heartburn can have Barrett's esophagus. However, it is found about 3 to 5 times more often in people with this condition. Barrett's esophagus is uncommon in children. The average age at diagnosis is 2. But it is usually difficult to know when the problem started. It is about twice as common in men as in women. It is much more common in white men than in men of other races. BARRETT'S ESOPHAGUS AND CANCER OF THE ESOPHAGUS Barrett's esophagus does not cause symptoms itself. However, it seems to precede the development of a particular kind of cancer. This cancer is esophageal adenocarcinoma. The risk of developing this cancer is 30 to 125 times higher in people who have Barrett's esophagus than in people who do not. This type of cancer is increasing quickly in white men. The increase is possibly related to the rise in obesity and GERD. For people  who have Barrett's esophagus, the risk of getting cancer of the esophagus is small. It is less than 1 percent (0.4% to 0.5%) per year. Esophageal adenocarcinoma is often not curable. This is partly because the disease is often discovered at a late stage and treatments are not effective. DIAGNOSIS Diagnosing Barrett's esophagus is not easy. At the present time it cannot be diagnosed based on symptoms, physical exam, or blood tests. The only useful test is upper gastrointestinal endoscopy and biopsy. In this procedure, a flexible tube called an endoscope is used. This tool is like a pencil sized flexible telescope. It has a light and tiny camera. It is passed into the esophagus. If the tissue appears suspicious to your caregiver, biopsies must be done. A biopsy is the removal of a small piece of tissue. This is done using a pincher-like device passed through the endoscope. A pathologist is a specialist who examines body tissue samples. He or she examines the tissue under a microscope to confirm the diagnosis. Looking for a medical problem in people who do not know whether they have one is called screening. Currently, there are no commonly accepted guidelines on who should have endoscopy, to check for Barrett's esophagus. There are many reasons for the lack of firm recommendations about screening. Among them are the great cost and occasional risk of side effects of the test. Also, the rate of finding Barrett's esophagus is low.  Finding the problem early has not been proven to prevent deaths from cancer. Many caregivers advise that adult patients who are over the age of 25 and have had GERD symptoms for a number of years have endoscopy, to see whether they have Barrett's esophagus. Screening for this condition in people who have no symptoms is not advised. TREATMENT  Barrett's esophagus has no cure, other than surgical removal of the esophagus. This is a serious operation. Surgery is advised only for people who  have a high risk of developing cancer or who already have it. Most caregivers recommend treating GERD with acid-blocking drugs. This is sometimes linked to improvement in the extent of the Barrett's tissue. But this approach has not been proven to reduce the risk of cancer. Treating reflux with surgery for GERD also does not seem to cure Barrett's esophagus. Several experimental approaches are under study. One attempts to see whether destroying the Barrett's tissue by heat or other means, through an endoscope, can get rid of the condition. But this approach has potential risks and unknown effectiveness. LOOKING FOR DYSPLASIA AND CANCER Occasional endoscopic examinations to look for early warning signs of cancer are generally advised for people who have Barrett's esophagus. This approach is called surveillance. When people who have Barrett's esophagus develop cancer, the process seems to go through an intermediate stage. In this stage cancer cells appear in the Barrett's tissue. This condition is called dysplasia. It can be seen only in biopsies with a microscope. The process is patchy and cannot be seen directly through the endoscope. So, multiple biopsies must be taken. Even then, it can be missed. The process of change from Barrett's to cancer seems to happen only in a few patients. It is less than 1 percent per year. And it happens over a relatively long period of time. Most caregivers advise that patients with Barrett's esophagus undergo occasional endoscopy to have biopsies. The recommended time between endoscopies varies depending on circumstances. The best time interval has not been decided. TREATMENT FOR DYSPLASIA OR ESOPHAGEAL ADENOCARCINOMA If a person with Barrett's esophagus is found to have dysplasia or cancer, the caregiver will usually recommend surgery. This is if the person is strong enough and has a good chance of being cured. The type of surgery may vary. But it usually involves removing  most of the esophagus and pulling the stomach up into the chest to attach it to what remains of the esophagus. Many patients with Barrett's esophagus are elderly. They may have many other medical problems that make surgery unwise. In these patients, other methods to treat dysplasia are being studied. IMPORTANT POINTS TO REMEMBER  In Barrett's esophagus, the cells lining the esophagus change. They become similar to the cells lining the intestine.   Barrett's esophagus is connected with gastroesophageal reflux disease or GERD.   A small number of people with Barrett's esophagus may develop esophageal cancer.   Barrett's esophagus is diagnosed by upper gastrointestinal endoscopy and biopsy.   People who have Barrett's esophagus should have periodic esophageal exams.   Taking acid-blocking drugs for GERD may help improve Barrett's esophagus.   Removal of the esophagus is recommended only for people who have a high risk of developing cancer or who already have it.  FOR MORE INFORMATION International Foundation for Functional Gastrointestinal Disorders (IFFGD): www.iffgd.org Document Released: 07/13/2003 Document Revised: 04/11/2011 Document Reviewed: 04/18/2008 [facility] Patient Information 2012 [place], [place].   Colon Polyps A polyp is extra tissue that grows inside your body. Colon polyps grow  in the large intestine. The large intestine, also called the colon, is part of your digestive system. It is a long, hollow tube at the end of your digestive tract where your body makes and stores stool. Most polyps are not dangerous. They are benign. This means they are not cancerous. But over time, some types of polyps can turn into cancer. Polyps that are smaller than a pea are usually not harmful. But larger polyps could someday become or may already be cancerous. To be safe, doctors remove all polyps and test them.  WHO GETS POLYPS? Anyone can get polyps, but certain people are more likely than  others. You may have a greater chance of getting polyps if:  You are over 50.   You have had polyps before.   Someone in your family has had polyps.   Someone in your family has had cancer of the large intestine.   Find out if someone in your family has had polyps. You may also be more likely to get polyps if you:   Eat a lot of fatty foods.   Smoke.   Drink alcohol.   Do not exercise.   Eat too much.  SYMPTOMS  Most small polyps do not cause symptoms. People often do not know they have one until their caregiver finds it during a regular checkup or while testing them for something else. Some people do have symptoms like these:  Bleeding from the anus. You might notice blood on your underwear or on toilet paper after you have had a bowel movement.   Constipation or diarrhea that lasts more than a week.   Blood in the stool. Blood can make stool look black or it can show up as red streaks in the stool.  If you have any of these symptoms, see your caregiver. HOW DOES THE DOCTOR TEST FOR POLYPS? The doctor can use four tests to check for polyps:  Digital rectal exam. The caregiver wears gloves and checks your rectum (the last part of the large intestine) to see if it feels normal. This test would find polyps only in the rectum. Your caregiver may need to do one of the other tests listed below to find polyps higher up in the intestine.   Barium enema. The caregiver puts a liquid called barium into your rectum before taking x-rays of your large intestine. Barium makes your intestine look white in the pictures. Polyps are dark, so they are easy to see.   Sigmoidoscopy. With this test, the caregiver can see inside your large intestine. A thin flexible tube is placed into your rectum. The device is called a sigmoidoscope, which has a light and a tiny video camera in it. The caregiver uses the sigmoidoscope to look at the last third of your large intestine.   Colonoscopy. This test is  like sigmoidoscopy, but the caregiver looks at all of the large intestine. It usually requires sedation. This is the most common method for finding and removing polyps.  TREATMENT   The caregiver will remove the polyp during sigmoidoscopy or colonoscopy. The polyp is then tested for cancer.   If you have had polyps, your caregiver may want you to get tested regularly in the future.  PREVENTION  There is not one sure way to prevent polyps. You might be able to lower your risk of getting them if you:  Eat more fruits and vegetables and less fatty food.   Do not smoke.   Avoid alcohol.   Exercise every  day.   Lose weight if you are overweight.   Eating more calcium and folate can also lower your risk of getting polyps. Some foods that are rich in calcium are milk, cheese, and broccoli. Some foods that are rich in folate are chickpeas, kidney beans, and spinach.   Aspirin might help prevent polyps. Studies are under way.  Document Released: 01/17/2004 Document Revised: 04/11/2011 Document Reviewed: 06/24/2007 [facility] Patient Information 2012 [place], [place].

## 2011-07-16 ENCOUNTER — Encounter: Payer: Self-pay | Admitting: Urgent Care

## 2011-07-16 DIAGNOSIS — K2270 Barrett's esophagus without dysplasia: Secondary | ICD-10-CM | POA: Insufficient documentation

## 2011-07-16 NOTE — Progress Notes (Signed)
Faxed to PCP

## 2011-07-16 NOTE — Assessment & Plan Note (Addendum)
Due for surveillance EGD.  I have discussed risks & benefits which include, but are not limited to, bleeding, infection, perforation & drug reaction.  The patient agrees with this plan & written consent will be obtained.    Continue Prilosec 20 mg twice a day Hold metformin and Onglyza the night before and morning of procedure. Bring diabetes medicines to the hospital to be taken after your procedure.

## 2011-07-16 NOTE — Progress Notes (Signed)
Primary Care Physician:  MCINNIS,ANGUS G, MD Primary Gastroenterologist:  Dr. Rourk  Chief Complaint  Patient presents with  . Follow-up    Barrett's esophagus  . EGD   HPI:  Donald Klein is a 66 y.o. male here to set up surveillance EGD with history of Barrett's esophagus. He is feeling very well. He is on Prilosec 20 mg twice a day. He denies any GI complaints at this time.  Specifically denies any upper GI symptoms including heartburn, indigestion, nausea, vomiting, dysphagia, odynophagia or anorexia. The diarrhea he was having last summer has resolved since his colonoscopy.  Past Medical History  Diagnosis Date  . HTN (hypertension)   . Chronic back pain   . PTSD (post-traumatic stress disorder)   . Diabetes mellitus   . GERD (gastroesophageal reflux disease)   . Barrett esophagus     Last EGD 05/27/08 Barrett's without dyplasia. EGD due 05/2011  . Helicobacter pylori gastritis 2009    treated  . Anxiety   . Depression   . Tubular adenoma of colon 10/08/10    Last colonosocpy 1 cecal TA  . Colitis     In 1980s, dx with UC by Dr. Khan at time of TCS, 1992 TCS, chronic colitis  . 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 Cholecystectomy   . 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 Colonoscopy 10/08/2010    Normal rectum/tubular adenoma/normal random biopsy    Current Outpatient Prescriptions  Medication Sig Dispense Refill  . amLODipine (NORVASC) 10 MG tablet Take 10 mg by mouth Daily. Take 1/2 daily      . aspirin 81 MG tablet Take 81 mg by mouth daily.        . CRESTOR 20 MG tablet Take 1 tablet by mouth daily.      . hydrochlorothiazide 25 MG tablet Take 25 mg by mouth daily.        . losartan (COZAAR) 100 MG tablet Take 100 mg by mouth daily.        . LOVAZA 1 G capsule Take 1 capsule by mouth Twice  daily.      . metFORMIN (GLUCOPHAGE) 1000 MG tablet Take 1,000 mg by mouth 2 (two) times daily with a meal.        . metoprolol (LOPRESSOR) 50 MG tablet Take 1 tablet by mouth Twice daily.      . omeprazole (PRILOSEC) 20 MG capsule TAKE ONE CAPSULE BY MOUTH 30MINS. BEFORE BREAKFAST AND EVENING MEAL DAILY  60 capsule  5  . ONGLYZA 5 MG TABS tablet Take 1 tablet by mouth daily.        Allergies as of 07/15/2011 - Review Complete 07/15/2011  Allergen Reaction Noted  . Penicillins      Family History:There is no known family history of colorectal carcinoma , liver disease, or inflammatory bowel disease.  Problem Relation Age of Onset  . Heart attack Father     73  . Colon cancer Neg Hx    History   Social History  . Marital Status: Married    Spouse Name: N/A    Number of Children: 1  . Years of Education: N/A   Occupational History  . disability    Social History Main Topics  . Smoking status: Never Smoker   . Smokeless tobacco: Never Used  .   Alcohol Use: No  . Drug Use: No  . Sexually Active: Yes -- Male partner(s)    Birth Control/ Protection: None     spouse  Review of Systems: Gen: Denies any fever, chills, sweats, anorexia, fatigue, weakness, malaise, weight loss, and sleep disorder CV: Denies chest pain, angina, palpitations, syncope, orthopnea, PND, peripheral edema, and claudication. Resp: Denies dyspnea at rest, dyspnea with exercise, cough, sputum, wheezing, coughing up blood, and pleurisy. GI: Denies vomiting blood, jaundice, and fecal incontinence.   Denies dysphagia or odynophagia. GU : Denies urinary burning, blood in urine, urinary frequency, urinary hesitancy, nocturnal urination, and urinary incontinence. MS: Denies joint pain, limitation of movement, and swelling, stiffness, low back pain, extremity pain. Denies muscle weakness, cramps, atrophy.  Derm: Denies rash, itching, dry skin, hives, moles, warts, or unhealing ulcers.  Psych: Denies depression,  anxiety, memory loss, suicidal ideation, hallucinations, paranoia, and confusion. Heme: Denies bruising, bleeding, and enlarged lymph nodes. Neuro:  Denies any headaches, dizziness, paresthesias. Endo:  Denies any problems with DM, thyroid, adrenal function.  Physical Exam: BP 117/65  Pulse 74  Temp(Src) 97.6 F (36.4 C) (Temporal)  Ht 5' 8" (1.727 m)  Wt 171 lb (77.565 kg)  BMI 26.00 kg/m2 General:   Alert,  Well-developed, well-nourished, pleasant and cooperative in NAD Head:  Normocephalic and atraumatic. Eyes:  Sclera clear, no icterus.   Conjunctiva pink. Ears:  Normal auditory acuity. Nose:  No deformity, discharge, or lesions. Mouth:  No deformity or lesions,oropharynx pink & moist. Neck:  Supple; no masses or thyromegaly. Lungs:  Clear throughout to auscultation.   No wheezes, crackles, or rhonchi. No acute distress. Heart:  Regular rate and rhythm; no murmurs, clicks, rubs,  or gallops. Abdomen:  Normal bowel sounds.  No bruits.  Soft, non-tender and non-distended without masses, hepatosplenomegaly or hernias noted.  No guarding or rebound tenderness.   Rectal:  Deferred Msk:  Symmetrical without gross deformities. Normal posture. Pulses:  Normal pulses noted. Extremities:  No clubbing or edema. Neurologic:  Alert and  oriented x4;  grossly normal neurologically. Skin:  Intact without significant lesions or rashes. Lymph Nodes:  No significant cervical adenopathy. Psych:  Alert and cooperative. Normal mood and affect.   

## 2011-07-16 NOTE — Assessment & Plan Note (Signed)
Well controlled on twice a day PPI.

## 2011-07-30 MED ORDER — SODIUM CHLORIDE 0.45 % IV SOLN
Freq: Once | INTRAVENOUS | Status: AC
  Administered 2011-07-31: 09:00:00 via INTRAVENOUS

## 2011-07-31 ENCOUNTER — Encounter (HOSPITAL_COMMUNITY): Payer: Self-pay | Admitting: *Deleted

## 2011-07-31 ENCOUNTER — Encounter (HOSPITAL_COMMUNITY)
Admission: RE | Disposition: A | Discharge status: Home / Self Care | Payer: Self-pay | Source: Ambulatory Visit | Attending: Internal Medicine

## 2011-07-31 ENCOUNTER — Ambulatory Visit (HOSPITAL_COMMUNITY)
Admission: RE | Admit: 2011-07-31 | Discharge: 2011-07-31 | Disposition: A | Discharge status: Home / Self Care | Payer: Medicare Other | Source: Ambulatory Visit | Attending: Internal Medicine | Admitting: Internal Medicine

## 2011-07-31 DIAGNOSIS — K449 Diaphragmatic hernia without obstruction or gangrene: Secondary | ICD-10-CM

## 2011-07-31 DIAGNOSIS — K5710 Diverticulosis of small intestine without perforation or abscess without bleeding: Secondary | ICD-10-CM

## 2011-07-31 DIAGNOSIS — Z09 Encounter for follow-up examination after completed treatment for conditions other than malignant neoplasm: Secondary | ICD-10-CM | POA: Insufficient documentation

## 2011-07-31 DIAGNOSIS — I10 Essential (primary) hypertension: Secondary | ICD-10-CM | POA: Insufficient documentation

## 2011-07-31 DIAGNOSIS — E119 Type 2 diabetes mellitus without complications: Secondary | ICD-10-CM | POA: Insufficient documentation

## 2011-07-31 DIAGNOSIS — K2270 Barrett's esophagus without dysplasia: Secondary | ICD-10-CM

## 2011-07-31 DIAGNOSIS — Z01812 Encounter for preprocedural laboratory examination: Secondary | ICD-10-CM | POA: Insufficient documentation

## 2011-07-31 DIAGNOSIS — Z7982 Long term (current) use of aspirin: Secondary | ICD-10-CM | POA: Insufficient documentation

## 2011-07-31 DIAGNOSIS — Z79899 Other long term (current) drug therapy: Secondary | ICD-10-CM | POA: Insufficient documentation

## 2011-07-31 HISTORY — PX: ESOPHAGOGASTRODUODENOSCOPY: SHX5428

## 2011-07-31 LAB — GLUCOSE, CAPILLARY: Glucose-Capillary: 131 mg/dL — ABNORMAL HIGH (ref 70–99)

## 2011-07-31 SURGERY — EGD (ESOPHAGOGASTRODUODENOSCOPY)
Anesthesia: Moderate Sedation

## 2011-07-31 MED ORDER — STERILE WATER FOR IRRIGATION IR SOLN
Status: DC | PRN
  Administered 2011-07-31: 10:00:00

## 2011-07-31 MED ORDER — BUTAMBEN-TETRACAINE-BENZOCAINE 2-2-14 % EX AERO
INHALATION_SPRAY | CUTANEOUS | Status: DC | PRN
  Administered 2011-07-31: 2 via TOPICAL

## 2011-07-31 MED ORDER — MIDAZOLAM HCL 5 MG/5ML IJ SOLN
INTRAMUSCULAR | Status: AC
  Filled 2011-07-31: qty 10

## 2011-07-31 MED ORDER — MEPERIDINE HCL 100 MG/ML IJ SOLN
INTRAMUSCULAR | Status: AC
  Filled 2011-07-31: qty 1

## 2011-07-31 MED ORDER — MIDAZOLAM HCL 5 MG/5ML IJ SOLN
INTRAMUSCULAR | Status: DC | PRN
  Administered 2011-07-31 (×2): 2 mg via INTRAVENOUS

## 2011-07-31 MED ORDER — MEPERIDINE HCL 100 MG/ML IJ SOLN
INTRAMUSCULAR | Status: DC | PRN
  Administered 2011-07-31: 50 mg via INTRAVENOUS
  Administered 2011-07-31: 25 mg via INTRAVENOUS

## 2011-07-31 NOTE — Op Note (Signed)
[facility] [address] [place], [place]  [address]  ENDOSCOPY PROCEDURE REPORT  PATIENT:  Donald, Klein  MR#:  [id number] BIRTHDATE:  [birth year], 66 yrs. old  GENDER:  male  ENDOSCOPIST:  R. Roetta Sessions, MD FACP [facility] ([facility] Referred by:          Dr. Dickey Gave  PROCEDURE DATE:  07/31/2011 PROCEDURE:  EGD with esophageal biopsy  INDICATIONS:   history Barrett's esophagus; surveillance examination.  INFORMED CONSENT:   The risks, benefits, limitations, alternatives and imponderables have been discussed.  The potential for biopsy, esophogeal dilation, etc. have also been reviewed.  Questions have been answered.  All parties agreeable.  Please see the history and physical in the medical record for more information.  MEDICATIONS:    Versed 4 mg IV and Demerol 75 mg IV in divided doses.  Cetacaine spray.  DESCRIPTION OF PROCEDURE:   The [id number] ([id number]) endoscope was introduced through the mouth and advanced to the second portion of the duodenum without difficulty or limitations.  The mucosal surfaces were surveyed very carefully during advancement of the scope and upon withdrawal.  Retroflexion view of the proximal stomach and esophagogastric junction was performed.  <<PROCEDUREIMAGES>>  FINDINGS:     Z line appears about 1-1/2-2 cm above the EG junction. No tumor. No esophagitis. Patent esophagus. Stomach emptied.     Small hiatal hernia; otherwise normal stomach. Patent pylorus. Diverticulum noted in the second portion of the duodenum distal to the ampulla.  THERAPEUTIC / DIAGNOSTIC MANEUVERS PERFORMED:    Circumferential biopsies of the salmon-colored epithelium taken for histologic study  COMPLICATIONS:   None  IMPRESSION:   Short segment Barrett's esophagus-status post biopsy. Hiatal hernia. Duodenal diverticulum.  RECOMMENDATIONS: Continue prosthetic. Followup on pathology.  ______________________________ R. Roetta Sessions, MD Caleen Essex  CC:  n. eSIGNED:   R. Roetta Sessions at 07/31/2011 10:05 AM  Ailene Ravel, [phone]

## 2011-07-31 NOTE — Discharge Instructions (Signed)
EGD °Discharge instructions °Please read the instructions outlined below and refer to this sheet in the next few weeks. These discharge instructions provide you with general information on caring for yourself after you leave the hospital. Your doctor may also give you specific instructions. While your treatment has been planned according to the most current medical practices available, unavoidable complications occasionally occur. If you have any problems or questions after discharge, please call your doctor. °ACTIVITY °· You may resume your regular activity but move at a slower pace for the next 24 hours.  °· Take frequent rest periods for the next 24 hours.  °· Walking will help expel (get rid of) the air and reduce the bloated feeling in your abdomen.  °· No driving for 24 hours (because of the anesthesia (medicine) used during the test).  °· You may shower.  °· Do not sign any important legal documents or operate any machinery for 24 hours (because of the anesthesia used during the test).  °NUTRITION °· Drink plenty of fluids.  °· You may resume your normal diet.  °· Begin with a light meal and progress to your normal diet.  °· Avoid alcoholic beverages for 24 hours or as instructed by your caregiver.  °MEDICATIONS °· You may resume your normal medications unless your caregiver tells you otherwise.  °WHAT YOU CAN EXPECT TODAY °· You may experience abdominal discomfort such as a feeling of fullness or “gas” pains.  °FOLLOW-UP °· Your doctor will discuss the results of your test with you.  °SEEK IMMEDIATE MEDICAL ATTENTION IF ANY OF THE FOLLOWING OCCUR: °· Excessive nausea (feeling sick to your stomach) and/or vomiting.  °· Severe abdominal pain and distention (swelling).  °· Trouble swallowing.  °· Temperature over 101° F (37.8º C).  °· Rectal bleeding or vomiting of blood.  ° ° ° °Further recommendations to follow pending review of pathology report °

## 2011-07-31 NOTE — H&P (View-Only) (Signed)
Primary Care Physician:  Alice Reichert, MD Primary Gastroenterologist:  Dr. Jena Gauss  Chief Complaint  Patient presents with  . Follow-up    Barrett's esophagus  . EGD   HPI:  Donald Klein is a 66 y.o. male here to set up surveillance EGD with history of Barrett's esophagus. He is feeling very well. He is on Prilosec 20 mg twice a day. He denies any GI complaints at this time.  Specifically denies any upper GI symptoms including heartburn, indigestion, nausea, vomiting, dysphagia, odynophagia or anorexia. The diarrhea he was having last summer has resolved since his colonoscopy.  Past Medical History  Diagnosis Date  . HTN (hypertension)   . Chronic back pain   . PTSD (post-traumatic stress disorder)   . Diabetes mellitus   . GERD (gastroesophageal reflux disease)   . Barrett esophagus     Last EGD 05/27/08 Barrett's without dyplasia. EGD due 05/2011  . Helicobacter pylori gastritis 2009    treated  . Anxiety   . Depression   . Tubular adenoma of colon 10/08/10    Last colonosocpy 1 cecal TA  . Colitis     In 1980s, dx with UC by Dr. Welton Flakes at time of TCS, 1992 TCS, chronic colitis  . 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 Cholecystectomy   . 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 Colonoscopy 10/08/2010    Normal rectum/tubular adenoma/normal random biopsy    Current Outpatient Prescriptions  Medication Sig Dispense Refill  . amLODipine (NORVASC) 10 MG tablet Take 10 mg by mouth Daily. Take 1/2 daily      . aspirin 81 MG tablet Take 81 mg by mouth daily.        . CRESTOR 20 MG tablet Take 1 tablet by mouth daily.      . hydrochlorothiazide 25 MG tablet Take 25 mg by mouth daily.        Marland Kitchen losartan (COZAAR) 100 MG tablet Take 100 mg by mouth daily.        Marland Kitchen LOVAZA 1 G capsule Take 1 capsule by mouth Twice  daily.      . metFORMIN (GLUCOPHAGE) 1000 MG tablet Take 1,000 mg by mouth 2 (two) times daily with a meal.        . metoprolol (LOPRESSOR) 50 MG tablet Take 1 tablet by mouth Twice daily.      Marland Kitchen omeprazole (PRILOSEC) 20 MG capsule TAKE ONE CAPSULE BY MOUTH . BEFORE BREAKFAST AND EVENING MEAL DAILY  60 capsule  5  . ONGLYZA 5 MG TABS tablet Take 1 tablet by mouth daily.        Allergies as of 07/15/2011 - Review Complete 07/15/2011  Allergen Reaction Noted  . Penicillins      Family History:There is no known family history of colorectal carcinoma , liver disease, or inflammatory bowel disease.  Problem Relation Age of Onset  . Heart attack Father     59  . Colon cancer Neg Hx    History   Social History  . Marital Status: Married    Spouse Name: N/A    Number of Children: 1  . Years of Education: N/A   Occupational History  . disability    Social History Main Topics  . Smoking status: Never Smoker   . Smokeless tobacco: Never Used  .  Alcohol Use: No  . Drug Use: No  . Sexually Active: Yes -- Male partner(s)    Birth Control/ Protection: None     spouse  Review of Systems: Gen: Denies any fever, chills, sweats, anorexia, fatigue, weakness, malaise, weight loss, and sleep disorder CV: Denies chest pain, angina, palpitations, syncope, orthopnea, PND, peripheral edema, and claudication. Resp: Denies dyspnea at rest, dyspnea with exercise, cough, sputum, wheezing, coughing up blood, and pleurisy. GI: Denies vomiting blood, jaundice, and fecal incontinence.   Denies dysphagia or odynophagia. GU : Denies urinary burning, blood in urine, urinary frequency, urinary hesitancy, nocturnal urination, and urinary incontinence. MS: Denies joint pain, limitation of movement, and swelling, stiffness, low back pain, extremity pain. Denies muscle weakness, cramps, atrophy.  Derm: Denies rash, itching, dry skin, hives, moles, warts, or unhealing ulcers.  Psych: Denies depression,  anxiety, memory loss, suicidal ideation, hallucinations, paranoia, and confusion. Heme: Denies bruising, bleeding, and enlarged lymph nodes. Neuro:  Denies any headaches, dizziness, paresthesias. Endo:  Denies any problems with DM, thyroid, adrenal function.  Physical Exam: BP 117/65  Pulse 74  Temp(Src) 97.6 F (36.4 C) (Temporal)  Ht 5\' 8"  (1.727 m)  Wt 171 lb (77.565 kg)  BMI 26.00 kg/m2 General:   Alert,  Well-developed, well-nourished, pleasant and cooperative in NAD Head:  Normocephalic and atraumatic. Eyes:  Sclera clear, no icterus.   Conjunctiva pink. Ears:  Normal auditory acuity. Nose:  No deformity, discharge, or lesions. Mouth:  No deformity or lesions,oropharynx pink & moist. Neck:  Supple; no masses or thyromegaly. Lungs:  Clear throughout to auscultation.   No wheezes, crackles, or rhonchi. No acute distress. Heart:  Regular rate and rhythm; no murmurs, clicks, rubs,  or gallops. Abdomen:  Normal bowel sounds.  No bruits.  Soft, non-tender and non-distended without masses, hepatosplenomegaly or hernias noted.  No guarding or rebound tenderness.   Rectal:  Deferred Msk:  Symmetrical without gross deformities. Normal posture. Pulses:  Normal pulses noted. Extremities:  No clubbing or edema. Neurologic:  Alert and  oriented x4;  grossly normal neurologically. Skin:  Intact without significant lesions or rashes. Lymph Nodes:  No significant cervical adenopathy. Psych:  Alert and cooperative. Normal mood and affect.

## 2011-07-31 NOTE — Interval H&P Note (Signed)
History and Physical Interval Note:  07/31/2011 9:33 AM  Donald Klein  has presented today for surgery, with the diagnosis of Barretts  The various methods of treatment have been discussed with the patient and family. After consideration of risks, benefits and other options for treatment, the patient has consented to  Procedure(s) (LRB): ESOPHAGOGASTRODUODENOSCOPY (EGD) (N/A) as a surgical intervention .  The patients' history has been reviewed, patient examined, no change in status, stable for surgery.  I have reviewed the patients' chart and labs.  Questions were answered to the patient's satisfaction.     [name]

## 2011-08-04 ENCOUNTER — Encounter: Payer: Self-pay | Admitting: Internal Medicine

## 2011-08-05 ENCOUNTER — Encounter (HOSPITAL_COMMUNITY): Payer: Self-pay | Admitting: Internal Medicine

## 2011-08-18 NOTE — Progress Notes (Signed)
REVIEWED.  

## 2012-04-06 ENCOUNTER — Ambulatory Visit (HOSPITAL_COMMUNITY)
Admission: RE | Admit: 2012-04-06 | Discharge: 2012-04-06 | Disposition: A | Discharge status: Home / Self Care | Payer: Medicare Other | Source: Ambulatory Visit | Attending: Family Medicine | Admitting: Family Medicine

## 2012-04-06 ENCOUNTER — Other Ambulatory Visit (HOSPITAL_COMMUNITY): Payer: Self-pay | Admitting: Family Medicine

## 2012-04-06 DIAGNOSIS — M545 Low back pain, unspecified: Secondary | ICD-10-CM

## 2012-04-13 ENCOUNTER — Ambulatory Visit (HOSPITAL_COMMUNITY)
Admission: RE | Admit: 2012-04-13 | Discharge: 2012-04-13 | Disposition: A | Discharge status: Home / Self Care | Payer: Medicare Other | Source: Ambulatory Visit | Attending: Family Medicine | Admitting: Family Medicine

## 2012-04-13 ENCOUNTER — Other Ambulatory Visit (HOSPITAL_COMMUNITY): Payer: Self-pay | Admitting: Family Medicine

## 2012-04-13 DIAGNOSIS — M545 Low back pain, unspecified: Secondary | ICD-10-CM

## 2012-04-13 DIAGNOSIS — M549 Dorsalgia, unspecified: Secondary | ICD-10-CM | POA: Insufficient documentation

## 2012-04-15 ENCOUNTER — Other Ambulatory Visit: Payer: Self-pay | Admitting: Family Medicine

## 2012-04-15 DIAGNOSIS — M545 Low back pain, unspecified: Secondary | ICD-10-CM

## 2012-04-17 ENCOUNTER — Ambulatory Visit
Admission: RE | Admit: 2012-04-17 | Discharge: 2012-04-17 | Disposition: A | Discharge status: Home / Self Care | Payer: Medicare Other | Source: Ambulatory Visit | Attending: Family Medicine | Admitting: Family Medicine

## 2012-04-17 DIAGNOSIS — M545 Low back pain, unspecified: Secondary | ICD-10-CM

## 2012-04-17 MED ORDER — METHYLPREDNISOLONE ACETATE 40 MG/ML INJ SUSP (RADIOLOG
120.0000 mg | Freq: Once | INTRAMUSCULAR | Status: AC
  Administered 2012-04-17: 120 mg via EPIDURAL

## 2012-04-17 MED ORDER — IOHEXOL 180 MG/ML  SOLN
1.0000 mL | Freq: Once | INTRAMUSCULAR | Status: AC | PRN
  Administered 2012-04-17: 1 mL via EPIDURAL

## 2012-05-08 ENCOUNTER — Other Ambulatory Visit: Payer: Self-pay | Admitting: Family Medicine

## 2012-05-08 DIAGNOSIS — M549 Dorsalgia, unspecified: Secondary | ICD-10-CM

## 2012-05-18 ENCOUNTER — Ambulatory Visit
Admission: RE | Admit: 2012-05-18 | Discharge: 2012-05-18 | Disposition: A | Discharge status: Home / Self Care | Payer: Medicare Other | Source: Ambulatory Visit | Attending: Family Medicine | Admitting: Family Medicine

## 2012-05-18 DIAGNOSIS — M549 Dorsalgia, unspecified: Secondary | ICD-10-CM

## 2012-05-18 MED ORDER — IOHEXOL 180 MG/ML  SOLN
1.0000 mL | Freq: Once | INTRAMUSCULAR | Status: AC | PRN
  Administered 2012-05-18: 1 mL via EPIDURAL

## 2012-05-18 MED ORDER — METHYLPREDNISOLONE ACETATE 40 MG/ML INJ SUSP (RADIOLOG
120.0000 mg | Freq: Once | INTRAMUSCULAR | Status: AC
  Administered 2012-05-18: 120 mg via EPIDURAL

## 2012-06-08 ENCOUNTER — Other Ambulatory Visit: Payer: Self-pay | Admitting: Family Medicine

## 2012-06-08 DIAGNOSIS — M5410 Radiculopathy, site unspecified: Secondary | ICD-10-CM

## 2012-06-17 ENCOUNTER — Ambulatory Visit
Admission: RE | Admit: 2012-06-17 | Discharge: 2012-06-17 | Disposition: A | Discharge status: Home / Self Care | Payer: Medicare Other | Source: Ambulatory Visit | Attending: Family Medicine | Admitting: Family Medicine

## 2012-06-17 DIAGNOSIS — M5410 Radiculopathy, site unspecified: Secondary | ICD-10-CM

## 2012-06-17 MED ORDER — METHYLPREDNISOLONE ACETATE 40 MG/ML INJ SUSP (RADIOLOG: 120.0000 mg | Freq: Once | INTRAMUSCULAR | Status: DC

## 2012-06-17 MED ORDER — IOHEXOL 180 MG/ML  SOLN: 1.0000 mL | Freq: Once | INTRAMUSCULAR | Status: AC | PRN

## 2012-09-25 ENCOUNTER — Encounter: Payer: Self-pay | Admitting: Gastroenterology

## 2012-09-29 ENCOUNTER — Encounter: Payer: Self-pay | Admitting: Gastroenterology

## 2012-09-29 ENCOUNTER — Ambulatory Visit (INDEPENDENT_AMBULATORY_CARE_PROVIDER_SITE_OTHER): Payer: Medicare Other | Admitting: Gastroenterology

## 2012-09-29 VITALS — BP 126/67 | HR 66 | Temp 98.1°F | Ht 68.0 in | Wt 171.6 lb

## 2012-09-29 DIAGNOSIS — K2270 Barrett's esophagus without dysplasia: Secondary | ICD-10-CM

## 2012-09-29 DIAGNOSIS — D490 Neoplasm of unspecified behavior of digestive system: Secondary | ICD-10-CM

## 2012-09-29 DIAGNOSIS — K219 Gastro-esophageal reflux disease without esophagitis: Secondary | ICD-10-CM

## 2012-09-29 DIAGNOSIS — R1013 Epigastric pain: Secondary | ICD-10-CM

## 2012-09-29 LAB — CBC WITH DIFFERENTIAL/PLATELET
Basophils Absolute: 0 10*3/uL (ref 0.0–0.1)
Basophils Relative: 0 % (ref 0–1)
Eosinophils Absolute: 0.8 10*3/uL — ABNORMAL HIGH (ref 0.0–0.7)
Eosinophils Relative: 15 % — ABNORMAL HIGH (ref 0–5)
HCT: 35.9 % — ABNORMAL LOW (ref 39.0–52.0)
Hemoglobin: 12.3 g/dL — ABNORMAL LOW (ref 13.0–17.0)
Lymphocytes Relative: 36 % (ref 12–46)
Lymphs Abs: 2 10*3/uL (ref 0.7–4.0)
MCH: 28 pg (ref 26.0–34.0)
MCHC: 34.3 g/dL (ref 30.0–36.0)
MCV: 81.6 fL (ref 78.0–100.0)
Monocytes Absolute: 0.6 10*3/uL (ref 0.1–1.0)
Monocytes Relative: 11 % (ref 3–12)
Neutro Abs: 2 10*3/uL (ref 1.7–7.7)
Neutrophils Relative %: 38 % — ABNORMAL LOW (ref 43–77)
Platelets: 196 10*3/uL (ref 150–400)
RBC: 4.4 MIL/uL (ref 4.22–5.81)
RDW: 15.3 % (ref 11.5–15.5)
WBC: 5.4 10*3/uL (ref 4.0–10.5)

## 2012-09-29 LAB — COMPREHENSIVE METABOLIC PANEL
ALT: 38 U/L (ref 0–53)
AST: 29 U/L (ref 0–37)
Albumin: 4.4 g/dL (ref 3.5–5.2)
Alkaline Phosphatase: 46 U/L (ref 39–117)
BUN: 19 mg/dL (ref 6–23)
CO2: 27 mEq/L (ref 19–32)
Calcium: 9.5 mg/dL (ref 8.4–10.5)
Chloride: 104 mEq/L (ref 96–112)
Creat: 1.17 mg/dL (ref 0.50–1.35)
Glucose, Bld: 161 mg/dL — ABNORMAL HIGH (ref 70–99)
Potassium: 3.8 mEq/L (ref 3.5–5.3)
Sodium: 142 mEq/L (ref 135–145)
Total Bilirubin: 0.4 mg/dL (ref 0.3–1.2)
Total Protein: 7 g/dL (ref 6.0–8.3)

## 2012-09-29 LAB — LIPASE: Lipase: 38 U/L (ref 0–75)

## 2012-09-29 NOTE — Progress Notes (Signed)
Primary Care Physician: Alice Reichert, MD  Primary Gastroenterologist:  Roetta Sessions, MD   Chief Complaint  Patient presents with  . Abdominal Pain    HPI: Donald Klein is a 67 y.o. male here for further evaluation of abdominal pain/epigastric pain. Four weeks ago started having some epigastric pain. Prescribed Bentyl but did not take it due to what he read on the side effects. Stopped drinking caffeine and spicy foods. Had been taking Vinegar TID to help with weight loss. Daily ASA but no other NSAIDS. Crampy type pain. Worse with empty stomach. Usually off/on loose stools, chronically. About 3 per day. No melena, brbpr. No heartburn. On omeprazole BID. No dysphagia. No odynophagia. Abdominal pain not affected by BMs. Some nocturnal BMs more recently. No weight loss.   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 Remote questionable h/o UC diagnosed by Dr. Welton Flakes at time of TCS. Last colonoscopy in 10/2010 with normal random bx, tubular adenoma, but no evidence of UC. Last EGD 07/2011, short segment Barrett's without dysplasia. Duodenal diverticulum.  Current Outpatient Prescriptions  Medication Sig Dispense Refill  . amLODipine (NORVASC) 10 MG tablet Take 10 mg by mouth Daily. Take 1/2 daily      . aspirin 81 MG tablet Take 81 mg by mouth daily.        . CRESTOR 20 MG tablet Take 1 tablet by mouth daily.      . hydrochlorothiazide 25 MG tablet Take 25 mg by mouth daily.        Marland Kitchen losartan (COZAAR) 100 MG tablet Take 100 mg by mouth daily.        Marland Kitchen LOVAZA 1 G capsule Take 1 capsule by mouth Twice daily.      . metFORMIN (GLUCOPHAGE) 1000 MG tablet Take 1,000 mg by mouth 2 (two) times daily with a meal.        . metoprolol (LOPRESSOR) 50 MG tablet Take 1 tablet by mouth Twice daily.      Marland Kitchen omeprazole (PRILOSEC) 20 MG capsule TAKE ONE CAPSULE BY MOUTH . BEFORE BREAKFAST AND EVENING MEAL DAILY  60 capsule  5  . ONGLYZA 5 MG TABS tablet Take 1 tablet by mouth  daily.       No current facility-administered medications for this visit.    Allergies as of 09/29/2012 - Review Complete 09/29/2012  Allergen Reaction Noted  . Penicillins Rash     ROS:  General: Negative for anorexia, weight loss, fever, chills, fatigue, weakness. ENT: Negative for hoarseness, difficulty swallowing , nasal congestion. CV: Negative for chest pain, angina, palpitations, dyspnea on exertion, peripheral edema.  Respiratory: Negative for dyspnea at rest, dyspnea on exertion, cough, sputum, wheezing.  GI: See history of present illness. GU:  Negative for dysuria, hematuria, urinary incontinence, urinary frequency, nocturnal urination.  Endo: Negative for unusual weight change.    Physical Examination:   BP 126/67  Pulse 66  Temp(Src) 98.1 F (36.7 C) (Oral)  Ht 5\' 8"  (1.727 m)  Wt 171 lb 9.6 oz (77.837 kg)  BMI 26.1 kg/m2  General: Well-nourished, well-developed in no acute distress.  Eyes: No icterus. Mouth: Oropharyngeal mucosa moist and pink , no lesions erythema or exudate. Lungs: Clear to auscultation bilaterally.  Heart: Regular rate and rhythm, no murmurs rubs or gallops.  Abdomen: Bowel sounds are normal, mild epig tenderness, nondistended, no hepatosplenomegaly or masses, no abdominal bruits or hernia , no rebound or guarding.   Extremities: No lower extremity edema. No clubbing  or deformities. Neuro: Alert and oriented x 4   Skin: Warm and dry, no jaundice.   Psych: Alert and cooperative, normal mood and affect.

## 2012-09-29 NOTE — Progress Notes (Signed)
Cc PCP 

## 2012-09-29 NOTE — Assessment & Plan Note (Signed)
67 year old gentleman with history of Barrett's esophagus without dysplasia, chronic GERD well controlled on omeprazole twice a day who presents with 4 week history of upper abdominal pain, crampy without change in bowel habits. Pain most days. Modest improvement with dietary restrictions as outlined above. Given chronicity of symptoms and history of small bowel tumor in the past, we'll pursue CT abdomen pelvis. Check labs. Further recommendations to follow.

## 2012-09-29 NOTE — Patient Instructions (Addendum)
1. Please have your blood work done today. 2. Please have CT of your stomach done as scheduled.

## 2012-10-02 ENCOUNTER — Telehealth: Payer: Self-pay

## 2012-10-02 ENCOUNTER — Ambulatory Visit (HOSPITAL_COMMUNITY)
Admission: RE | Admit: 2012-10-02 | Discharge: 2012-10-02 | Disposition: A | Discharge status: Home / Self Care | Payer: Medicare Other | Source: Ambulatory Visit | Attending: Gastroenterology | Admitting: Gastroenterology

## 2012-10-02 DIAGNOSIS — N281 Cyst of kidney, acquired: Secondary | ICD-10-CM | POA: Insufficient documentation

## 2012-10-02 DIAGNOSIS — Z8509 Personal history of malignant neoplasm of other digestive organs: Secondary | ICD-10-CM | POA: Insufficient documentation

## 2012-10-02 DIAGNOSIS — E119 Type 2 diabetes mellitus without complications: Secondary | ICD-10-CM | POA: Insufficient documentation

## 2012-10-02 DIAGNOSIS — I10 Essential (primary) hypertension: Secondary | ICD-10-CM | POA: Insufficient documentation

## 2012-10-02 DIAGNOSIS — R1013 Epigastric pain: Secondary | ICD-10-CM | POA: Insufficient documentation

## 2012-10-02 DIAGNOSIS — K6389 Other specified diseases of intestine: Secondary | ICD-10-CM | POA: Insufficient documentation

## 2012-10-02 DIAGNOSIS — D490 Neoplasm of unspecified behavior of digestive system: Secondary | ICD-10-CM

## 2012-10-02 DIAGNOSIS — R933 Abnormal findings on diagnostic imaging of other parts of digestive tract: Secondary | ICD-10-CM | POA: Insufficient documentation

## 2012-10-02 MED ORDER — IOHEXOL 300 MG/ML  SOLN
100.0000 mL | Freq: Once | INTRAMUSCULAR | Status: AC | PRN
  Administered 2012-10-02: 100 mL via INTRAVENOUS

## 2012-10-02 NOTE — Telephone Encounter (Signed)
Donald Klein, call report from [place] at [facility] Radiology on CT of abdomen and pelvis. PLEASE NOTE NUMBER ONE IMPRESSION PER RADIOLOGIST!( SEE REPORT IN EPIC)

## 2012-10-02 NOTE — Progress Notes (Signed)
Quick Note:  Large soft tissue mass centered in the root of the SB mesentery 3.8 X 5.7cm. ?recurrence of previously resected spindle cell tumor.No bowel obstruction. Patient states he had resection before but no adjuvant therapy.  Discussed with Dr. Jena Gauss. Needs ASAP referral to GI specialized surgeon at [facility]. Make sure patient takes CD of CT. Send copy of my OV note, CT report, labs, OP note from 2010 (Dr. Leticia Penna small bowel tumor/obstruction). See me if any questions on what to fax.  Patient will be expecting a phone call first of the week with appt information.  He also complained of feeling nauseated, lightheaded today after CT. Thinks his sugar is low because he hasn't eaten enough. Advised to check sugar. If high (having to hold metformin), he can take his onglyza and if too high >350-400 and not feeling well then he should go to ER. Drink plenty of fluid to hydrate. ______

## 2012-10-05 ENCOUNTER — Other Ambulatory Visit: Payer: Self-pay | Admitting: Gastroenterology

## 2012-10-05 ENCOUNTER — Other Ambulatory Visit: Payer: Self-pay | Admitting: Internal Medicine

## 2012-10-05 DIAGNOSIS — K6389 Other specified diseases of intestine: Secondary | ICD-10-CM

## 2012-10-05 NOTE — Progress Notes (Signed)
Patient is scheduled with Dr. Byrd Hesselbach at [facility] 10/15/12 at 9:30 and he is aware

## 2012-10-06 NOTE — Telephone Encounter (Signed)
Addressed on 10/02/12.

## 2012-10-06 NOTE — Progress Notes (Signed)
Patient is scheduled with Dr. Waters at [facility] 10/15/12 at 9:30 and he is aware 

## 2012-10-06 NOTE — Progress Notes (Signed)
Quick Note:  Do we know when his appt is yet? ______

## 2012-10-08 NOTE — Progress Notes (Signed)
Quick Note:  Mild persistent anemia. EGD/TCS up to date.  Referral to Dr. Byrd Hesselbach is scheduled due to small bowel tumor. Patient sees them next week.  Patient aware of findings. ______

## 2012-11-03 HISTORY — PX: EXPLORATORY LAPAROTOMY W/ BOWEL RESECTION: SHX1544

## 2012-11-03 HISTORY — PX: APPENDECTOMY: SHX54

## 2013-01-01 DIAGNOSIS — D4819 Other specified neoplasm of uncertain behavior of connective and other soft tissue: Secondary | ICD-10-CM | POA: Insufficient documentation

## 2013-06-14 ENCOUNTER — Ambulatory Visit (HOSPITAL_COMMUNITY)
Admission: RE | Admit: 2013-06-14 | Discharge: 2013-06-14 | Disposition: A | Discharge status: Home / Self Care | Payer: Medicare Other | Source: Ambulatory Visit | Attending: Family Medicine | Admitting: Family Medicine

## 2013-06-14 ENCOUNTER — Other Ambulatory Visit (HOSPITAL_COMMUNITY): Payer: Self-pay | Admitting: Family Medicine

## 2013-06-14 DIAGNOSIS — R52 Pain, unspecified: Secondary | ICD-10-CM

## 2013-06-14 DIAGNOSIS — M545 Low back pain, unspecified: Secondary | ICD-10-CM | POA: Insufficient documentation

## 2013-06-14 DIAGNOSIS — R079 Chest pain, unspecified: Secondary | ICD-10-CM | POA: Insufficient documentation

## 2013-06-14 DIAGNOSIS — M549 Dorsalgia, unspecified: Secondary | ICD-10-CM | POA: Insufficient documentation

## 2013-08-13 ENCOUNTER — Encounter: Payer: Self-pay | Admitting: Gastroenterology

## 2014-07-06 ENCOUNTER — Encounter: Payer: Self-pay | Admitting: Internal Medicine

## 2014-08-11 ENCOUNTER — Encounter: Payer: Self-pay | Admitting: Urgent Care

## 2014-08-12 ENCOUNTER — Encounter: Payer: Self-pay | Admitting: Gastroenterology

## 2014-08-12 ENCOUNTER — Ambulatory Visit (INDEPENDENT_AMBULATORY_CARE_PROVIDER_SITE_OTHER): Payer: Medicare Other | Admitting: Gastroenterology

## 2014-08-12 VITALS — BP 168/82 | HR 71 | Temp 97.6°F | Ht 69.0 in | Wt 176.2 lb

## 2014-08-12 DIAGNOSIS — K2270 Barrett's esophagus without dysplasia: Secondary | ICD-10-CM

## 2014-08-12 DIAGNOSIS — R197 Diarrhea, unspecified: Secondary | ICD-10-CM

## 2014-08-12 NOTE — Progress Notes (Signed)
Referring Provider: Marjean Donna, MD Primary Care Physician:  Lanette Hampshire, MD  Primary GI: Dr. Gala Romney    Chief Complaint  Patient presents with  . EGD    HPI:   Donald Klein is a 69 y.o. 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 Remote questionable h/o UC diagnosed by Dr. Humphrey Rolls at time of TCS. Last colonoscopy in 10/2010 with normal random bx, tubular adenoma, but no evidence of UC. Last EGD 07/2011, short segment Barrett's without dysplasia. Duodenal diverticulum. Due for surveillance now.   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   No N/V. No GERD symptoms. Prilosec 20 mg BID. No abdominal pain. States loose stool with metformin. Sometimes BM four times a day. Postprandial component. Worse with coffee. No rectal bleeding. Antibiotic exposure in last few months.   Past Medical History  Diagnosis Date  . HTN (hypertension)   . Chronic back pain   . PTSD (post-traumatic stress disorder)   . Diabetes mellitus   . GERD (gastroesophageal reflux disease)   . Barrett esophagus     Last EGD 05/27/08 Barrett's without dyplasia. EGD due 05/2011  . Helicobacter pylori gastritis 2009    treated  . Anxiety   . Depression   . Tubular adenoma of colon 10/08/10    Last colonosocpy 1 cecal TA  . Colitis     In 1980s, dx with UC by Dr. Humphrey Rolls at time of TCS, 1992 TCS, chronic colitis  . 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 Cholecystectomy    . 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 Colonoscopy  10/08/2010    Normal rectum/tubular adenoma/normal random biopsy. Next TCS due 10/2015.  Marland Kitchen Esophagogastroduodenoscopy  07/31/2011    Dr.  Doyce Para segment Barrett's esophagus s/p bx Hiatal hernia. Duodenal diverticulum. No dysplasia.Next EGD 07/2014.  Marland Kitchen Exploratory laparotomy w/ bowel resection  July 2014    [facility]: with lysis of adhesions, small bowel resection X 2, resection of mesenteric mass, appendectomy, path: fibromatosis   . Appendectomy  July 2014    at time of small bowel resection    Current Outpatient Prescriptions  Medication Sig Dispense Refill  . amLODipine (NORVASC) 10 MG tablet Take 10 mg by mouth Daily. Take 1/2 daily    . aspirin 81 MG tablet Take 81 mg by mouth daily.      . CRESTOR 20 MG tablet Take 1 tablet by mouth daily.    . hydrochlorothiazide 25 MG tablet Take 25 mg by mouth daily.      Marland Kitchen losartan (COZAAR) 100 MG tablet Take 100 mg by mouth daily.      Marland Kitchen LOVAZA 1 G capsule Take 1 capsule by mouth Twice daily.    . metFORMIN (GLUCOPHAGE) 1000 MG tablet Take 1,000 mg by mouth 2 (two) times daily with a meal.      . metoprolol (LOPRESSOR) 50 MG tablet Take 1 tablet by mouth Twice daily. 1.5 tablets in am   1 tablets in pm    . Multiple Vitamin (MULTIVITAMIN) capsule Take 1 capsule by mouth daily.    Marland Kitchen omeprazole (PRILOSEC) 20 MG capsule TAKE ONE CAPSULE BY MOUTH 30MINS. BEFORE BREAKFAST AND  EVENING MEAL DAILY 60 capsule 5  . ONGLYZA 5 MG TABS tablet Take 1 tablet by mouth daily.     No current facility-administered medications for this visit.    Allergies as of 08/12/2014 - Review Complete 08/12/2014  Allergen Reaction Noted  . Penicillins Rash     Family History  Problem Relation Age of Onset  . Heart attack Father     1  . Colon cancer Neg Hx     History   Social History  . Marital Status: Married    Spouse Name: N/A  . Number of Children: 1  . Years of Education: N/A   Occupational History  . disability    Social History Main Topics  . Smoking status: Never Smoker   . Smokeless tobacco: Never Used  . Alcohol Use: No  . Drug Use: No  . Sexual Activity:    Partners:  Female    Museum/gallery curator: None     Comment: spouse   Other Topics Concern  . None   Social History Narrative    Review of Systems: As mentioned in HPI  Physical Exam: BP 168/82 mmHg  Pulse 71  Temp(Src) 97.6 F (36.4 C) (Oral)  Ht 5\' 9"  (1.753 m)  Wt 176 lb 3.2 oz (79.924 kg)  BMI 26.01 kg/m2 General:   Alert and oriented. No distress noted. Pleasant and cooperative.  Head:  Normocephalic and atraumatic. Eyes:  Conjuctiva clear without scleral icterus. Mouth:  Oral mucosa pink and moist. Good dentition. No lesions. Heart:  S1, S2 present without murmurs, rubs, or gallops. Regular rate and rhythm. Abdomen:  +BS, soft, non-tender and non-distended. No rebound or guarding. Midline well-healed prior laparotomy incision Msk:  Symmetrical without gross deformities. Normal posture. Extremities:  Without edema. Neurologic:  Alert and  oriented x4;  grossly normal neurologically. Skin:  Intact without significant lesions or rashes. Psych:  Alert and cooperative. Normal mood and affect.

## 2014-08-12 NOTE — Patient Instructions (Signed)
Please complete the stool studies and return to the lab.   We will get blood work from your other doctors.   You definitely need an upper endoscopy due to your history of Barrett's esophagus, and you may need a colonoscopy as well. I will let you know after I see the results of the labs and stool studies.

## 2014-08-13 LAB — CLOSTRIDIUM DIFFICILE BY PCR: Toxigenic C. Difficile by PCR: NOT DETECTED

## 2014-08-15 LAB — GIARDIA ANTIGEN: Giardia Screen (EIA): NEGATIVE

## 2014-08-16 ENCOUNTER — Telehealth: Payer: Self-pay | Admitting: Gastroenterology

## 2014-08-16 DIAGNOSIS — K2270 Barrett's esophagus without dysplasia: Secondary | ICD-10-CM

## 2014-08-16 LAB — STOOL CULTURE

## 2014-08-16 MED ORDER — DICYCLOMINE HCL 10 MG PO CAPS: 10.0000 mg | ORAL_CAPSULE | Freq: Three times a day (TID) | ORAL | Status: DC

## 2014-08-16 NOTE — Progress Notes (Signed)
cc'ed to pcp °

## 2014-08-16 NOTE — Telephone Encounter (Signed)
Stool studies negative.   Start Bentyl 10 mg with meals.   Need CBC. I requested outside records but only have A1c.   If any anemia, will need colonoscopy at time of EGD.

## 2014-08-16 NOTE — Assessment & Plan Note (Signed)
History of remote Remote questionable h/o UC diagnosed by Dr. Humphrey Rolls at time of TCS. Last colonoscopy in 10/2010 with normal random bx, tubular adenoma, but no evidence of UC. Now with recurrent loose stool, postprandial component. Seems to correlate with metformin dosing, which may likely be the culprit. No rectal bleeding. However, he has had antibiotic exposure in last few months. Check stool studies for completeness' sake. Consider early interval colonoscopy if stool studies negative and/or any evidence of overt GI bleeding. Outside labs obtained from PCP and was only A1c. Will order CBC. If anemia, proceed with colonoscopy. Await stool studies.

## 2014-08-16 NOTE — Assessment & Plan Note (Signed)
69 year old male with history of Barrett's esophagus, due for routine surveillance now. Last EGD 2013 with short segment Barrett's, no dysplasia. No concerning upper GI symptoms. Remains on Prilosec 20 mg BID  Proceed with upper endoscopy in the near future with Dr. Gala Romney. The risks, benefits, and alternatives have been discussed in detail with patient. They have stated understanding and desire to proceed.  Continue Prilosec 20 mg po BID

## 2014-08-16 NOTE — Progress Notes (Signed)
Quick Note:  Stool studies negative as expected. Start Bentyl, prescription sent to pharmacy. ______

## 2014-08-18 NOTE — Telephone Encounter (Signed)
Tried to call pt- NA 

## 2014-08-22 ENCOUNTER — Other Ambulatory Visit: Payer: Self-pay

## 2014-08-22 DIAGNOSIS — K2270 Barrett's esophagus without dysplasia: Secondary | ICD-10-CM

## 2014-08-22 NOTE — Telephone Encounter (Signed)
Tried to call pt- NA 

## 2014-08-22 NOTE — Telephone Encounter (Signed)
Pt is aware. Lab order done.  

## 2014-08-23 LAB — CBC WITH DIFFERENTIAL/PLATELET
Basophils Absolute: 0 10*3/uL (ref 0.0–0.1)
Basophils Relative: 0 % (ref 0–1)
Eosinophils Absolute: 0.6 10*3/uL (ref 0.0–0.7)
Eosinophils Relative: 8 % — ABNORMAL HIGH (ref 0–5)
HCT: 40.6 % (ref 39.0–52.0)
Hemoglobin: 13.7 g/dL (ref 13.0–17.0)
Lymphocytes Relative: 34 % (ref 12–46)
Lymphs Abs: 2.6 10*3/uL (ref 0.7–4.0)
MCH: 28.3 pg (ref 26.0–34.0)
MCHC: 33.7 g/dL (ref 30.0–36.0)
MCV: 83.9 fL (ref 78.0–100.0)
MPV: 10.9 fL (ref 8.6–12.4)
Monocytes Absolute: 0.8 10*3/uL (ref 0.1–1.0)
Monocytes Relative: 11 % (ref 3–12)
Neutro Abs: 3.6 10*3/uL (ref 1.7–7.7)
Neutrophils Relative %: 47 % (ref 43–77)
Platelets: 216 10*3/uL (ref 150–400)
RBC: 4.84 MIL/uL (ref 4.22–5.81)
RDW: 15.3 % (ref 11.5–15.5)
WBC: 7.6 10*3/uL (ref 4.0–10.5)

## 2014-08-25 ENCOUNTER — Encounter: Payer: Self-pay | Admitting: Internal Medicine

## 2014-08-25 NOTE — Progress Notes (Signed)
Quick Note:  No anemia on CBC.  Return in 6 weeks. ______

## 2014-08-25 NOTE — Progress Notes (Signed)
APPOINTMENT MADE AND LETTER SENT °

## 2014-08-25 NOTE — Progress Notes (Signed)
Quick Note:  Called to tell pt. Not available. Mailing a letter and routing to [place] to schedule appt in 6 weeks. ______

## 2014-09-08 NOTE — Telephone Encounter (Signed)
Pt has been placed in an urgent appointment spot on 09/14/2014 @ 3:30pm Called Pt. No answer

## 2014-09-08 NOTE — Telephone Encounter (Signed)
CORRECTION: 30 days up by 5/8.   Doubt he can have an EGD by then. If not, I will see him for an updated H&P at no cost, can put in an urgent if needed.

## 2014-09-08 NOTE — Telephone Encounter (Signed)
LMOM

## 2014-09-08 NOTE — Telephone Encounter (Signed)
Patient needs Barrett's surveillance. No need for colonoscopy at this time. Can we get this in before the 12th? 30 days will be up before then.

## 2014-09-14 ENCOUNTER — Other Ambulatory Visit: Payer: Self-pay

## 2014-09-14 ENCOUNTER — Encounter: Payer: Self-pay | Admitting: Gastroenterology

## 2014-09-14 ENCOUNTER — Ambulatory Visit: Payer: Medicare Other | Admitting: Gastroenterology

## 2014-09-14 VITALS — BP 142/76 | HR 72 | Temp 97.3°F | Ht 69.0 in | Wt 172.6 lb

## 2014-09-14 DIAGNOSIS — K2270 Barrett's esophagus without dysplasia: Secondary | ICD-10-CM

## 2014-09-14 DIAGNOSIS — R197 Diarrhea, unspecified: Secondary | ICD-10-CM

## 2014-09-14 MED ORDER — PEG-KCL-NACL-NASULF-NA ASC-C 100 G PO SOLR: 1.0000 | ORAL | Status: DC

## 2014-09-14 NOTE — Progress Notes (Signed)
Referring Provider: Marjean Donna, MD Primary Care Physician:  Lanette Hampshire, MD  Primary GI: Dr. Gala Romney   Chief Complaint  Patient presents with  . update H&P    HPI:   Donald Klein is a 69 y.o. 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 Remote questionable h/o UC diagnosed by Dr. Humphrey Rolls at time of TCS in the 1990s. Last colonoscopy in 10/2010 with normal random bx, tubular adenoma, but no evidence of UC. Last EGD 07/2011, short segment Barrett's without dysplasia. Duodenal diverticulum. Due for surveillance now.   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   Notes loose stool with metformin. Postprandial component. Antibiotic exposure in past several months but recent stool studies negative. Bentyl sent to pharmacy to start. Consideration for colonoscopy at time of EGD now to be discussed.   Patient states he sees the metformin in his stool. Bentyl without much improvement in diarrhea. Drinks coffee or hot tea and has to go to the bathroom. No rectal bleeding. No abdominal pain. No N/V or breakthrough GERD. Prilosec   Past Medical History  Diagnosis Date  . HTN (hypertension)   . Chronic back pain   . PTSD (post-traumatic stress disorder)   . Diabetes mellitus   . GERD (gastroesophageal reflux disease)   . Barrett esophagus     Last EGD 05/27/08 Barrett's without dyplasia. EGD due 05/2011  . Helicobacter pylori gastritis 2009    treated  . Anxiety   . Depression   . Tubular adenoma of colon 10/08/10    Last colonosocpy 1 cecal TA  . Colitis     In 1980s, dx with UC by Dr. Humphrey Rolls at time of TCS, 1992 TCS, chronic colitis  . 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 Cholecystectomy    . Laparoscopic small  bowel resection  03/2009    small bowel tumor (Spindle cell neoplasm with obstruction)  . Colonoscopy  10/08/2010    Normal rectum/tubular adenoma/normal random biopsy. Next TCS due 10/2015.  Marland Kitchen Esophagogastroduodenoscopy  07/31/2011    Dr. Doyce Para segment Barrett's esophagus s/p bx Hiatal hernia. Duodenal diverticulum. No dysplasia.Next EGD 07/2014.  Marland Kitchen Exploratory laparotomy w/ bowel resection  July 2014    [facility]: with lysis of adhesions, small bowel resection X 2, resection of mesenteric mass, appendectomy, path: fibromatosis   . Appendectomy  July 2014    at time of small bowel resection    Current Outpatient Prescriptions  Medication Sig Dispense Refill  . amLODipine (NORVASC) 10 MG tablet Take 10 mg by mouth Daily. Take 1/2 daily    . aspirin 81 MG tablet Take 81 mg by mouth daily.      . CRESTOR 20 MG tablet Take 1 tablet by mouth daily.    Marland Kitchen dicyclomine (BENTYL) 10 MG capsule Take 1 capsule (10 mg total) by mouth 4 (four) times daily -  before meals and at bedtime. 120 capsule 3  . hydrochlorothiazide 25 MG tablet Take 25 mg by mouth daily.      Marland Kitchen losartan (COZAAR) 100 MG tablet Take 100 mg by mouth daily.      Marland Kitchen LOVAZA 1 G capsule Take 1 capsule by mouth Twice daily.    . metFORMIN (GLUCOPHAGE) 1000 MG tablet  Take 1,000 mg by mouth 2 (two) times daily with a meal.      . metoprolol (LOPRESSOR) 50 MG tablet Take 1 tablet by mouth Twice daily. 2 tablets in am   1.5 tablets in pm    . Multiple Vitamin (MULTIVITAMIN) capsule Take 1 capsule by mouth daily.    Marland Kitchen omeprazole (PRILOSEC) 20 MG capsule TAKE ONE CAPSULE BY MOUTH 30MINS. BEFORE BREAKFAST AND EVENING MEAL DAILY 60 capsule 5  . ONGLYZA 5 MG TABS tablet Take 1 tablet by mouth daily.     No current facility-administered medications for this visit.    Allergies as of 09/14/2014 - Review Complete 09/14/2014  Allergen Reaction Noted  . Penicillins Rash     Family History  Problem Relation Age of Onset  . Heart attack  Father     66  . Colon cancer Neg Hx     History   Social History  . Marital Status: Married    Spouse Name: N/A  . Number of Children: 1  . Years of Education: N/A   Occupational History  . disability    Social History Main Topics  . Smoking status: Never Smoker   . Smokeless tobacco: Never Used  . Alcohol Use: No  . Drug Use: No  . Sexual Activity:    Partners: Female    Museum/gallery curator: None     Comment: spouse   Other Topics Concern  . None   Social History Narrative    Review of Systems: Gen: Denies fever, chills, anorexia. Denies fatigue, weakness, weight loss.  CV: Denies chest pain, palpitations, syncope, peripheral edema, and claudication. Resp: Denies dyspnea at rest, cough, wheezing, coughing up blood, and pleurisy. GI: see HPI Derm: Denies rash, itching, dry skin Psych: +PTSD Heme: Denies bruising, bleeding, and enlarged lymph nodes.  Physical Exam: BP 142/76 mmHg  Pulse 72  Temp(Src) 97.3 F (36.3 C)  Ht 5\' 9"  (1.753 m)  Wt 172 lb 9.6 oz (78.291 kg)  BMI 25.48 kg/m2 General:   Alert and oriented. No distress noted. Pleasant and cooperative.  Head:  Normocephalic and atraumatic. Eyes:  Conjuctiva clear without scleral icterus. Mouth:  Oral mucosa pink and moist. Good dentition. No lesions. Heart:  S1, S2 present without murmurs, rubs, or gallops. Regular rate and rhythm. Abdomen:  +BS, soft, non-tender and non-distended. No rebound or guarding. No HSM or masses noted. Msk:  Symmetrical without gross deformities. Normal posture. Extremities:  Without edema. Neurologic:  Alert and  oriented x4;  grossly normal neurologically. Skin:  Intact without significant lesions or rashes. Psych:  Alert and cooperative. Normal mood and affect.  Lab Results  Component Value Date   WBC 7.6 08/22/2014   HGB 13.7 08/22/2014   HCT 40.6 08/22/2014   MCV 83.9 08/22/2014   PLT 216 08/22/2014

## 2014-09-14 NOTE — Patient Instructions (Signed)
We have scheduled you for a colonoscopy and upper endoscopy with Dr. Gala Romney in the near future.  I had talked about Viberzi for diarrhea, but I can't give you this due to your history of small bowel obstruction and resections. HOWEVER, let's trial Imodium 2 milligrams every other morning for now.   Further recommendations after colonoscopy and upper endoscopy.

## 2014-09-22 NOTE — Assessment & Plan Note (Signed)
With history of remote questionable UC diagnosed by Dr. Humphrey Rolls, with last colonoscopy June 2012 noting normal random biopsies, tubular adenoma, no evidence of UC. Continues with postprandial loose stool but without rectal bleeding. Stool studies negative. No anemia on recent CBC. Query side effect of metformin, possible bile salt diarrhea, unable to exclude occult etiology or evolving IBD. As of note, Bentyl without much improvement. He is not a candidate for Viberzi due to history of small bowel obstruction. I feel it is in his best interest to proceed with a colonoscopy with random biopsies for further assessment.  Proceed with TCS with Dr. Gala Romney in near future: the risks, benefits, and alternatives have been discussed with the patient in detail. The patient states understanding and desires to proceed.

## 2014-09-22 NOTE — Progress Notes (Signed)
CC'ED TO PCP 

## 2014-09-22 NOTE — Assessment & Plan Note (Signed)
69 year old male with history of Barrett's esophagus, due for surveillance now. Last EGD 2013 with short segments Barrett's, no dysplasia. He is without concerning upper GI symptoms. Remains on Prilosec BID.   Proceed with upper endoscopy in the near future with Dr. Gala Romney. The risks, benefits, and alternatives have been discussed in detail with patient. They have stated understanding and desire to proceed.

## 2014-09-30 ENCOUNTER — Ambulatory Visit (HOSPITAL_COMMUNITY)
Admission: RE | Admit: 2014-09-30 | Discharge: 2014-09-30 | Disposition: A | Discharge status: Home / Self Care | Payer: Medicare Other | Source: Ambulatory Visit | Attending: Internal Medicine | Admitting: Internal Medicine

## 2014-09-30 ENCOUNTER — Encounter (HOSPITAL_COMMUNITY): Payer: Self-pay | Admitting: *Deleted

## 2014-09-30 ENCOUNTER — Encounter (HOSPITAL_COMMUNITY)
Admission: RE | Disposition: A | Discharge status: Home / Self Care | Payer: Self-pay | Source: Ambulatory Visit | Attending: Internal Medicine

## 2014-09-30 DIAGNOSIS — F329 Major depressive disorder, single episode, unspecified: Secondary | ICD-10-CM | POA: Insufficient documentation

## 2014-09-30 DIAGNOSIS — K3189 Other diseases of stomach and duodenum: Secondary | ICD-10-CM | POA: Diagnosis not present

## 2014-09-30 DIAGNOSIS — R197 Diarrhea, unspecified: Secondary | ICD-10-CM | POA: Diagnosis not present

## 2014-09-30 DIAGNOSIS — I10 Essential (primary) hypertension: Secondary | ICD-10-CM | POA: Insufficient documentation

## 2014-09-30 DIAGNOSIS — G8929 Other chronic pain: Secondary | ICD-10-CM | POA: Diagnosis not present

## 2014-09-30 DIAGNOSIS — K222 Esophageal obstruction: Secondary | ICD-10-CM | POA: Insufficient documentation

## 2014-09-30 DIAGNOSIS — K219 Gastro-esophageal reflux disease without esophagitis: Secondary | ICD-10-CM | POA: Insufficient documentation

## 2014-09-30 DIAGNOSIS — K2950 Unspecified chronic gastritis without bleeding: Secondary | ICD-10-CM | POA: Insufficient documentation

## 2014-09-30 DIAGNOSIS — F4310 Post-traumatic stress disorder, unspecified: Secondary | ICD-10-CM | POA: Diagnosis not present

## 2014-09-30 DIAGNOSIS — F419 Anxiety disorder, unspecified: Secondary | ICD-10-CM | POA: Diagnosis not present

## 2014-09-30 DIAGNOSIS — K449 Diaphragmatic hernia without obstruction or gangrene: Secondary | ICD-10-CM | POA: Diagnosis not present

## 2014-09-30 DIAGNOSIS — M549 Dorsalgia, unspecified: Secondary | ICD-10-CM | POA: Insufficient documentation

## 2014-09-30 DIAGNOSIS — E119 Type 2 diabetes mellitus without complications: Secondary | ICD-10-CM | POA: Insufficient documentation

## 2014-09-30 DIAGNOSIS — K5710 Diverticulosis of small intestine without perforation or abscess without bleeding: Secondary | ICD-10-CM | POA: Insufficient documentation

## 2014-09-30 DIAGNOSIS — Z7982 Long term (current) use of aspirin: Secondary | ICD-10-CM | POA: Diagnosis not present

## 2014-09-30 DIAGNOSIS — K2270 Barrett's esophagus without dysplasia: Secondary | ICD-10-CM | POA: Diagnosis not present

## 2014-09-30 DIAGNOSIS — Z86010 Personal history of colonic polyps: Secondary | ICD-10-CM | POA: Insufficient documentation

## 2014-09-30 HISTORY — PX: ESOPHAGOGASTRODUODENOSCOPY: SHX5428

## 2014-09-30 HISTORY — PX: COLONOSCOPY: SHX5424

## 2014-09-30 LAB — GLUCOSE, CAPILLARY: Glucose-Capillary: 158 mg/dL — ABNORMAL HIGH (ref 65–99)

## 2014-09-30 SURGERY — COLONOSCOPY
Anesthesia: Moderate Sedation

## 2014-09-30 MED ORDER — MEPERIDINE HCL 100 MG/ML IJ SOLN
INTRAMUSCULAR | Status: DC | PRN
  Administered 2014-09-30: 25 mg via INTRAVENOUS
  Administered 2014-09-30: 50 mg via INTRAVENOUS
  Administered 2014-09-30: 25 mg via INTRAVENOUS

## 2014-09-30 MED ORDER — STERILE WATER FOR IRRIGATION IR SOLN
Status: DC | PRN
  Administered 2014-09-30: 14:00:00

## 2014-09-30 MED ORDER — MIDAZOLAM HCL 5 MG/5ML IJ SOLN
INTRAMUSCULAR | Status: DC | PRN
  Administered 2014-09-30: 2 mg via INTRAVENOUS
  Administered 2014-09-30 (×2): 1 mg via INTRAVENOUS
  Administered 2014-09-30: 2 mg via INTRAVENOUS

## 2014-09-30 MED ORDER — LIDOCAINE VISCOUS 2 % MT SOLN
OROMUCOSAL | Status: DC | PRN
  Administered 2014-09-30: 3 mL via OROMUCOSAL

## 2014-09-30 MED ORDER — ONDANSETRON HCL 4 MG/2ML IJ SOLN
INTRAMUSCULAR | Status: AC
  Filled 2014-09-30: qty 2

## 2014-09-30 MED ORDER — SODIUM CHLORIDE 0.9 % IV SOLN
INTRAVENOUS | Status: DC
  Administered 2014-09-30: 13:00:00 via INTRAVENOUS

## 2014-09-30 MED ORDER — MIDAZOLAM HCL 5 MG/5ML IJ SOLN
INTRAMUSCULAR | Status: AC
  Filled 2014-09-30: qty 10

## 2014-09-30 MED ORDER — MEPERIDINE HCL 100 MG/ML IJ SOLN
INTRAMUSCULAR | Status: AC
  Filled 2014-09-30: qty 2

## 2014-09-30 MED ORDER — ONDANSETRON HCL 4 MG/2ML IJ SOLN
INTRAMUSCULAR | Status: DC | PRN
  Administered 2014-09-30: 4 mg via INTRAVENOUS

## 2014-09-30 MED ORDER — LIDOCAINE VISCOUS 2 % MT SOLN
OROMUCOSAL | Status: AC
  Filled 2014-09-30: qty 15

## 2014-09-30 NOTE — Interval H&P Note (Signed)
History and Physical Interval Note:  09/30/2014 1:37 PM  Donald Klein  has presented today for surgery, with the diagnosis of diarrhea/barretts  The various methods of treatment have been discussed with the patient and family. After consideration of risks, benefits and other options for treatment, the patient has consented to  Procedure(s) with comments: COLONOSCOPY (N/A) - 130  ESOPHAGOGASTRODUODENOSCOPY (EGD) (N/A) as a surgical intervention .  The patient's history has been reviewed, patient examined, no change in status, stable for surgery.  I have reviewed the patient's chart and labs.  Questions were answered to the patient's satisfaction.     Robert Rourk  No change. EGD and colonoscopy per plan. The risks, benefits, limitations, imponderables and alternatives regarding both EGD and colonoscopy have been reviewed with the patient. Questions have been answered. All parties agreeable.

## 2014-09-30 NOTE — H&P (View-Only) (Signed)
Referring Provider: Marjean Donna, MD Primary Care Physician:  Lanette Hampshire, MD  Primary GI: Dr. Gala Romney   Chief Complaint  Patient presents with  . update H&P    HPI:   Donald Klein is a 69 y.o. 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 Remote questionable h/o UC diagnosed by Dr. Humphrey Rolls at time of TCS in the 1990s. Last colonoscopy in 10/2010 with normal random bx, tubular adenoma, but no evidence of UC. Last EGD 07/2011, short segment Barrett's without dysplasia. Duodenal diverticulum. Due for surveillance now.   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   Notes loose stool with metformin. Postprandial component. Antibiotic exposure in past several months but recent stool studies negative. Bentyl sent to pharmacy to start. Consideration for colonoscopy at time of EGD now to be discussed.   Patient states he sees the metformin in his stool. Bentyl without much improvement in diarrhea. Drinks coffee or hot tea and has to go to the bathroom. No rectal bleeding. No abdominal pain. No N/V or breakthrough GERD. Prilosec   Past Medical History  Diagnosis Date  . HTN (hypertension)   . Chronic back pain   . PTSD (post-traumatic stress disorder)   . Diabetes mellitus   . GERD (gastroesophageal reflux disease)   . Barrett esophagus     Last EGD 05/27/08 Barrett's without dyplasia. EGD due 05/2011  . Helicobacter pylori gastritis 2009    treated  . Anxiety   . Depression   . Tubular adenoma of colon 10/08/10    Last colonosocpy 1 cecal TA  . Colitis     In 1980s, dx with UC by Dr. Humphrey Rolls at time of TCS, 1992 TCS, chronic colitis  . 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 Cholecystectomy    . Laparoscopic small  bowel resection  03/2009    small bowel tumor (Spindle cell neoplasm with obstruction)  . Colonoscopy  10/08/2010    Normal rectum/tubular adenoma/normal random biopsy. Next TCS due 10/2015.  Marland Kitchen Esophagogastroduodenoscopy  07/31/2011    Dr. Doyce Para segment Barrett's esophagus s/p bx Hiatal hernia. Duodenal diverticulum. No dysplasia.Next EGD 07/2014.  Marland Kitchen Exploratory laparotomy w/ bowel resection  July 2014    [facility]: with lysis of adhesions, small bowel resection X 2, resection of mesenteric mass, appendectomy, path: fibromatosis   . Appendectomy  July 2014    at time of small bowel resection    Current Outpatient Prescriptions  Medication Sig Dispense Refill  . amLODipine (NORVASC) 10 MG tablet Take 10 mg by mouth Daily. Take 1/2 daily    . aspirin 81 MG tablet Take 81 mg by mouth daily.      . CRESTOR 20 MG tablet Take 1 tablet by mouth daily.    Marland Kitchen dicyclomine (BENTYL) 10 MG capsule Take 1 capsule (10 mg total) by mouth 4 (four) times daily -  before meals and at bedtime. 120 capsule 3  . hydrochlorothiazide 25 MG tablet Take 25 mg by mouth daily.      Marland Kitchen losartan (COZAAR) 100 MG tablet Take 100 mg by mouth daily.      Marland Kitchen LOVAZA 1 G capsule Take 1 capsule by mouth Twice daily.    . metFORMIN (GLUCOPHAGE) 1000 MG tablet  Take 1,000 mg by mouth 2 (two) times daily with a meal.      . metoprolol (LOPRESSOR) 50 MG tablet Take 1 tablet by mouth Twice daily. 2 tablets in am   1.5 tablets in pm    . Multiple Vitamin (MULTIVITAMIN) capsule Take 1 capsule by mouth daily.    Marland Kitchen omeprazole (PRILOSEC) 20 MG capsule TAKE ONE CAPSULE BY MOUTH 30MINS. BEFORE BREAKFAST AND EVENING MEAL DAILY 60 capsule 5  . ONGLYZA 5 MG TABS tablet Take 1 tablet by mouth daily.     No current facility-administered medications for this visit.    Allergies as of 09/14/2014 - Review Complete 09/14/2014  Allergen Reaction Noted  . Penicillins Rash     Family History  Problem Relation Age of Onset  . Heart attack  Father     50  . Colon cancer Neg Hx     History   Social History  . Marital Status: Married    Spouse Name: N/A  . Number of Children: 1  . Years of Education: N/A   Occupational History  . disability    Social History Main Topics  . Smoking status: Never Smoker   . Smokeless tobacco: Never Used  . Alcohol Use: No  . Drug Use: No  . Sexual Activity:    Partners: Female    Museum/gallery curator: None     Comment: spouse   Other Topics Concern  . None   Social History Narrative    Review of Systems: Gen: Denies fever, chills, anorexia. Denies fatigue, weakness, weight loss.  CV: Denies chest pain, palpitations, syncope, peripheral edema, and claudication. Resp: Denies dyspnea at rest, cough, wheezing, coughing up blood, and pleurisy. GI: see HPI Derm: Denies rash, itching, dry skin Psych: +PTSD Heme: Denies bruising, bleeding, and enlarged lymph nodes.  Physical Exam: BP 142/76 mmHg  Pulse 72  Temp(Src) 97.3 F (36.3 C)  Ht 5\' 9"  (1.753 m)  Wt 172 lb 9.6 oz (78.291 kg)  BMI 25.48 kg/m2 General:   Alert and oriented. No distress noted. Pleasant and cooperative.  Head:  Normocephalic and atraumatic. Eyes:  Conjuctiva clear without scleral icterus. Mouth:  Oral mucosa pink and moist. Good dentition. No lesions. Heart:  S1, S2 present without murmurs, rubs, or gallops. Regular rate and rhythm. Abdomen:  +BS, soft, non-tender and non-distended. No rebound or guarding. No HSM or masses noted. Msk:  Symmetrical without gross deformities. Normal posture. Extremities:  Without edema. Neurologic:  Alert and  oriented x4;  grossly normal neurologically. Skin:  Intact without significant lesions or rashes. Psych:  Alert and cooperative. Normal mood and affect.  Lab Results  Component Value Date   WBC 7.6 08/22/2014   HGB 13.7 08/22/2014   HCT 40.6 08/22/2014   MCV 83.9 08/22/2014   PLT 216 08/22/2014

## 2014-09-30 NOTE — Discharge Instructions (Signed)
Colonoscopy Discharge Instructions  Read the instructions outlined below and refer to this sheet in the next few weeks. These discharge instructions provide you with general information on caring for yourself after you leave the hospital. Your doctor may also give you specific instructions. While your treatment has been planned according to the most current medical practices available, unavoidable complications occasionally occur. If you have any problems or questions after discharge, call Dr. Gala Romney at [phone]. ACTIVITY  You may resume your regular activity, but move at a slower pace for the next 24 hours.   Take frequent rest periods for the next 24 hours.   Walking will help get rid of the air and reduce the bloated feeling in your belly (abdomen).   No driving for 24 hours (because of the medicine (anesthesia) used during the test).    Do not sign any important legal documents or operate any machinery for 24 hours (because of the anesthesia used during the test).  NUTRITION  Drink plenty of fluids.   You may resume your normal diet as instructed by your doctor.   Begin with a light meal and progress to your normal diet. Heavy or fried foods are harder to digest and may make you feel sick to your stomach (nauseated).   Avoid alcoholic beverages for 24 hours or as instructed.  MEDICATIONS  You may resume your normal medications unless your doctor tells you otherwise.  WHAT YOU CAN EXPECT TODAY  Some feelings of bloating in the abdomen.   Passage of more gas than usual.   Spotting of blood in your stool or on the toilet paper.  IF YOU HAD POLYPS REMOVED DURING THE COLONOSCOPY:  No aspirin products for 7 days or as instructed.   No alcohol for 7 days or as instructed.   Eat a soft diet for the next 24 hours.  FINDING OUT THE RESULTS OF YOUR TEST Not all test results are available during your visit. If your test results are not back during the visit, make an appointment  with your caregiver to find out the results. Do not assume everything is normal if you have not heard from your caregiver or the medical facility. It is important for you to follow up on all of your test results.  SEEK IMMEDIATE MEDICAL ATTENTION IF:  You have more than a spotting of blood in your stool.   Your belly is swollen (abdominal distention).   You are nauseated or vomiting.   You have a temperature over 101.  You have abdominal pain or discomfort that is severe or gets worse throughout the day. EGD Discharge instructions Please read the instructions outlined below and refer to this sheet in the next few weeks. These discharge instructions provide you with general information on caring for yourself after you leave the hospital. Your doctor may also give you specific instructions. While your treatment has been planned according to the most current medical practices available, unavoidable complications occasionally occur. If you have any problems or questions after discharge, please call your doctor. ACTIVITY You may resume your regular activity but move at a slower pace for the next 24 hours.  Take frequent rest periods for the next 24 hours.  Walking will help expel (get rid of) the air and reduce the bloated feeling in your abdomen.  No driving for 24 hours (because of the anesthesia (medicine) used during the test).  You may shower.  Do not sign any important legal documents or operate any machinery for 24  any important legal documents or operate any machinery for 24 hours (because of the anesthesia used during the test).  °NUTRITION °· Drink plenty of fluids.  °· You may resume your normal diet.  °· Begin with a light meal and progress to your normal diet.  °· Avoid alcoholic beverages for 24 hours or as instructed by your caregiver.  °MEDICATIONS °· You may resume your normal medications unless your caregiver tells you otherwise.  °WHAT YOU CAN EXPECT TODAY °· You may experience abdominal discomfort such as a feeling of  fullness or “gas” pains.  °FOLLOW-UP °· Your doctor will discuss the results of your test with you.  °SEEK IMMEDIATE MEDICAL ATTENTION IF ANY OF THE FOLLOWING OCCUR: °· Excessive nausea (feeling sick to your stomach) and/or vomiting.  °· Severe abdominal pain and distention (swelling).  °· Trouble swallowing.  °· Temperature over 101° F (37.8º C).  °· Rectal bleeding or vomiting of blood.  ° ° °Further recommendations to follow pending review of pathology report ° °

## 2014-09-30 NOTE — Op Note (Signed)
[facility] [address] [place], [address]   ENDOSCOPY PROCEDURE REPORT  PATIENT: Donald Klein, Donald Klein  MR#: [id number] BIRTHDATE: [birth year] , 69  yrs. old GENDER: male ENDOSCOPIST: R.  Garfield Cornea, MD FACP FACG REFERRED BY:  Marjean Donna, M.D. PROCEDURE DATE:  Oct 14, 2014 PROCEDURE:  EGD w/ biopsy INDICATIONS:  Surveillance examination; history of short segment Barrett's esophagus. MEDICATIONS: Versed 4 mg IV and Demerol 75 mg IV in divided doses. Xylocaine gel orally.  Zofran 4 mg IV. ASA CLASS:      Class II  CONSENT: The risks, benefits, limitations, alternatives and imponderables have been discussed.  The potential for biopsy, esophogeal dilation, etc. have also been reviewed.  Questions have been answered.  All parties agreeable.  Please see the history and physical in the medical record for more information.  DESCRIPTION OF PROCEDURE: After the risks benefits and alternatives of the procedure were thoroughly explained, informed consent was obtained.  The EG-[id number] ([id number]) endoscope was introduced through the mouth and advanced to the second portion of the duodenum , limited by Without limitations. The instrument was slowly withdrawn as the mucosa was fully examined.    Salmon colored epithelium coming up no more than 2 cm above the GE junction.  Superimposed Schatzki's ring.  No nodularity.  No esophagitis.  Tubular esophagus was patent throughout its course.  Stomach empty.  2 cm hiatal hernia.  Injected, erythematous appearing gastric mucosa in a patchy distribution.  No ulcer or infiltrating process.  Patent pylorus.  Examination of bulb and second portion revealed a duodenal diverticulum in the second portion only.  Retroflexed views revealed a hiatal hernia. Biopsies the abnormal distal esophagus taken. Subsequently, biopsies of the gastric mucosa also taken. The scope was then withdrawn from the patient and the procedure  completed.  COMPLICATIONS: There were no immediate complications.  ENDOSCOPIC IMPRESSION: Abnormal distal esophagus consistent with prior diagnosis of short segment Barrett's esophagus?"status post biopsy. Noncritical Schatzki's ring?"not manipulated. Hiatal hernia. Abnormal Gastric mucosa of uncertain significance?"status post gastric biopsy.   RECOMMENDATIONS: Follow-up on pathology. See colonoscopy report.  REPEAT EXAM:  eSigned:  R. Garfield Cornea, MD Rosalita Chessman [facility] Oct 14, 2014 2:00 PM    CC:  CPT CODES: ICD CODES:  The ICD and CPT codes recommended by this software are interpretations from the data that the clinical staff has captured with the software.  The verification of the translation of this report to the ICD and CPT codes and modifiers is the sole responsibility of the health care institution and practicing physician where this report was generated.  [employer] will not be held responsible for the validity of the ICD and CPT codes included on this report.  AMA assumes no liability for data contained or not contained herein. CPT is a Designer, television/film set of the [employer].

## 2014-09-30 NOTE — Op Note (Signed)
[facility] [address] [place], [address]   COLONOSCOPY PROCEDURE REPORT  PATIENT: Donald Klein, Donald Klein  MR#: [id number] BIRTHDATE: August 24, 1945 , 69  yrs. old GENDER: male ENDOSCOPIST: R.  Garfield Cornea, MD FACP [facility] REFERRED GH:WEXHB Everette Rank, M.D. PROCEDURE DATE:  2014-10-06 PROCEDURE:   Colonoscopy with biopsy INDICATIONS:Chronic diarrhea; history of colonic adenoma. MEDICATIONS: Versed 6 mg IV and Demerol 100 mg IV in divided doses. Zofran 4 mg IV. ASA CLASS:       Class II  CONSENT: The risks, benefits, alternatives and imponderables including but not limited to bleeding, perforation as well as the possibility of a missed lesion have been reviewed.  The potential for biopsy, lesion removal, etc. have also been discussed. Questions have been answered.  All parties agreeable.  Please see the history and physical in the medical record for more information.  DESCRIPTION OF PROCEDURE:   After the risks benefits and alternatives of the procedure were thoroughly explained, informed consent was obtained.  The digital rectal exam revealed no abnormalities of the rectum.   The EG-[id number] ([id number])  endoscope was introduced through the anus and advanced to the cecum, which was identified by both the appendix and ileocecal valve. No adverse events experienced.   The quality of the prep was adequate  The instrument was then slowly withdrawn as the colon was fully examined.      COLON FINDINGS: Normal-appearing rectal mucosa.  Long redundant colon; changing the patient's position and external abdominal pressure required to reach the cecum.  The colonic mucosa appeared normal.  The ileocecal valve/cecum was somewhat inverted making access more difficult.  The blind in the cecum was reached. Ileocecal valve was identified.  Difficulties encountered likely related to prior surgery involving the adjacent small bowel. Segmental biopsies of the ascending descending/sigmoid  segments taken. Stool sample submitted to the lab.Retroflexion was performed. .  Withdrawal time=7 minutes 0 seconds.  The scope was withdrawn and the procedure completed. COMPLICATIONS: There were no immediate complications.  ENDOSCOPIC IMPRESSION: Redundant colon. Status post segmental biopsy and stool sampling. It would be useful to go back and look at the operative reports to see exactly how much  ileum was resected. This will aid in diagnosis and management of his condition  RECOMMENDATIONS: Follow up on pathology. Follow-up stool studies. See EGD report.  eSigned:  R. Garfield Cornea, MD Rosalita Chessman [facility] 06-Oct-2014 2:51 PM   cc:  CPT CODES: ICD CODES:  The ICD and CPT codes recommended by this software are interpretations from the data that the clinical staff has captured with the software.  The verification of the translation of this report to the ICD and CPT codes and modifiers is the sole responsibility of the health care institution and practicing physician where this report was generated.  [place] will not be held responsible for the validity of the ICD and CPT codes included on this report.  AMA assumes no liability for data contained or not contained herein. CPT is a Designer, television/film set of the [employer].  PATIENT NAME:  Donald Klein, Donald Klein MR#: [id number]

## 2014-10-04 ENCOUNTER — Encounter (HOSPITAL_COMMUNITY): Payer: Self-pay | Admitting: Internal Medicine

## 2014-10-05 ENCOUNTER — Telehealth: Payer: Self-pay

## 2014-10-05 ENCOUNTER — Encounter: Payer: Self-pay | Admitting: Internal Medicine

## 2014-10-05 LAB — GI PATHOGEN PANEL BY PCR, STOOL
C difficile toxin A/B: NOT DETECTED
Campylobacter by PCR: NOT DETECTED
Cryptosporidium by PCR: NOT DETECTED
E coli (ETEC) LT/ST: NOT DETECTED
E coli (STEC): NOT DETECTED
E coli 0157 by PCR: NOT DETECTED
G lamblia by PCR: NOT DETECTED
Norovirus GI/GII: NOT DETECTED
Rotavirus A by PCR: NOT DETECTED
Salmonella by PCR: NOT DETECTED
Shigella by PCR: NOT DETECTED

## 2014-10-05 NOTE — Telephone Encounter (Signed)
Called [facility]- spoke with Amy- gi pathogen panel will not be done until next week. She said around the 3rd and it should be faxed to [place].

## 2014-10-05 NOTE — Telephone Encounter (Signed)
Per RMR-  Send letter to patient.  Send copy of letter with path to referring provider and PCP.  Needs an office visit with AS regarding about issues in the next 4-5 weeks please.      GI pathogen panel remains pending.

## 2014-10-05 NOTE — Telephone Encounter (Signed)
Letter mailed to the pt. 

## 2014-10-06 ENCOUNTER — Ambulatory Visit: Payer: Medicare Other | Admitting: Gastroenterology

## 2014-10-10 ENCOUNTER — Encounter: Payer: Self-pay | Admitting: Gastroenterology

## 2014-10-10 NOTE — Telephone Encounter (Signed)
Pt is aware of OV on 6/29 at 0930 with AS and appt card mailed

## 2014-11-02 ENCOUNTER — Encounter: Payer: Self-pay | Admitting: Gastroenterology

## 2014-11-02 ENCOUNTER — Ambulatory Visit (INDEPENDENT_AMBULATORY_CARE_PROVIDER_SITE_OTHER): Payer: Medicare Other | Admitting: Gastroenterology

## 2014-11-02 VITALS — BP 146/75 | HR 74 | Temp 98.1°F | Ht 69.0 in | Wt 174.0 lb

## 2014-11-02 DIAGNOSIS — K2270 Barrett's esophagus without dysplasia: Secondary | ICD-10-CM

## 2014-11-02 DIAGNOSIS — R197 Diarrhea, unspecified: Secondary | ICD-10-CM | POA: Diagnosis not present

## 2014-11-02 MED ORDER — PANTOPRAZOLE SODIUM 40 MG PO TBEC: 40.0000 mg | DELAYED_RELEASE_TABLET | Freq: Every day | ORAL | Status: DC

## 2014-11-02 NOTE — Patient Instructions (Signed)
Stop Prilosec. Start taking Protonix once each morning, 30 minutes before breakfast.   Please have stool study completed.  You may take imodium 2 mg each morning for now. I am reviewing records from the [place] prior to further recommendations. We may need to use something called Questran instead of imodium, but I need to review records first.   3 month follow-up. Enjoy your summer!

## 2014-11-02 NOTE — Progress Notes (Signed)
Referring Provider: Marjean Donna, MD Primary Care Physician:  Lanette Hampshire, MD  Primary GI: Dr. Gala Romney   Chief Complaint  Patient presents with  . Follow-up    HPI:   Donald Klein is a 69 y.o. 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 Remote questionable h/o UC diagnosed by Dr. Humphrey Rolls at time of TCS in the 1990s. Colonoscopy in 2012 without evidence of disease, and most recent colonoscopy in May 2016 with negative segmental biopsies and stool sampling. +Barrett esophagus on EGD, on 3-year-surveillance monitoring. In 2014 underwent exploratory laparotomy with lysis of adhesions, small bowel resection, resection of mesenteric mass, appy due to fibromatosis. Returns today in follow-up after colonoscopy due to chronic diarrhea. Differentials including metformin side effect, malabsorptive component high on differentials, bile salt diarrhea. His weight has remained stable. Less likely pancreatic insufficiency.   Denies abdominal pain. Persistent diarrhea. Sometimes up to 3 times before 10am. States he was seeing metformin in his stool but his PCP stated it was likely the "shell" from the medication. Potassium dropped while on vacation, had to go to the hospital, now on supplements. Now on Vit D supplementation as well due to deficiency. States an upper GI series will be done at the [place] in [place]. [facility] in [place]. States his PCP sent him there for GI evaluation. States he has fecal incontinence at times. States he can't take any trips on buses/airplanes, as he is afraid he won't get to the bathroom. Followed by [facility] due to history of tumor.    Past Medical History  Diagnosis Date  . HTN (hypertension)   . Chronic back pain   . PTSD (post-traumatic stress disorder)   . Diabetes mellitus   . GERD (gastroesophageal reflux disease)   . Barrett esophagus     Last EGD 05/27/08 Barrett's without  dyplasia. EGD due 05/2011  . Helicobacter pylori gastritis 2009    treated  . Anxiety   . Depression   . Tubular adenoma of colon 10/08/10    Last colonosocpy 1 cecal TA  . Colitis     In 1980s, dx with UC by Dr. Humphrey Rolls at time of TCS, 1992 TCS, chronic colitis  . 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 Cholecystectomy    . 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 Colonoscopy  10/08/2010    Normal rectum/tubular adenoma/normal random biopsy. Next TCS due 10/2015.  Marland Kitchen Esophagogastroduodenoscopy  07/31/2011    Dr. Doyce Para segment Barrett's esophagus s/p bx Hiatal hernia. Duodenal diverticulum. No dysplasia.Next EGD 07/2014.  Marland Kitchen Exploratory laparotomy w/ bowel resection  July 2014    [facility]: with lysis of adhesions, small bowel resection X 2, resection of mesenteric mass, appendectomy, path: fibromatosis   . Appendectomy  July 2014    at time of small bowel resection  . Colonoscopy N/A 09/30/2014    Dr. Gala Romney: Redunant colon . Status post segmental biopsy and stool sampling. Negative stool samples and colonic biopsies.   . Esophagogastroduodenoscopy N/A 09/30/2014    Dr. Gala Romney: Abnormal distal esophagus consistant with prior diagnosis of short segment Barrett's esophagus status post biopsy. 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 Hiatal hernia. Abnormal Gastric mucosa of uncertain significance status  post gastric biopsy. path with +Barrett's esophagus but no dysplasia, mild chronic gastritis. Surveillance for Barrett's due in 2019    Current Outpatient Prescriptions  Medication Sig Dispense Refill  . amLODipine (NORVASC) 10 MG tablet Take 5 mg by mouth Daily. Take 1/2 daily    . aspirin 81 MG tablet Take 81 mg by mouth daily.      . Cholecalciferol (VITAMIN D) 2000 UNITS tablet Take 2,000 Units by mouth  daily.    . CRESTOR 20 MG tablet Take 1 tablet by mouth daily.    . dicyclomine (BENTYL) 10 MG capsule Take 1 capsule (10 mg total) by mouth 4 (four) times daily -  before meals and at bedtime. 120 capsule 3  . hydrochlorothiazide 25 MG tablet Take 25 mg by mouth daily.      . losartan (COZAAR) 100 MG tablet Take 100 mg by mouth daily.      . LOVAZA 1 G capsule Take 1 capsule by mouth Twice daily.    . metFORMIN (GLUCOPHAGE) 1000 MG tablet Take 1,000 mg by mouth 2 (two) times daily with a meal.      . metoprolol (LOPRESSOR) 50 MG tablet Take 75-100 tablets by mouth Twice daily. 2 tablets in am ; 1.5 tablets in pm    . Multiple Vitamin (MULTIVITAMIN) capsule Take 1 capsule by mouth daily.    . omeprazole (PRILOSEC) 20 MG capsule TAKE ONE CAPSULE BY MOUTH 30MINS. BEFORE BREAKFAST AND EVENING MEAL DAILY 60 capsule 5  . ONGLYZA 5 MG TABS tablet Take 1 tablet by mouth daily.    . potassium chloride (K-DUR) 10 MEQ tablet Take 10 mEq by mouth daily.    . peg 3350 powder (MOVIPREP) 100 G SOLR Take 1 kit (200 g total) by mouth as directed. (Patient not taking: Reported on 11/02/2014) 1 kit 0   No current facility-administered medications for this visit.    Allergies as of 11/02/2014 - Review Complete 09/30/2014  Allergen Reaction Noted  . Lovenox [enoxaparin sodium] Hives and Rash 09/30/2014  . Penicillins Rash     Family History  Problem Relation Age of Onset  . Heart attack Father     73  . Colon cancer Neg Hx     History   Social History  . Marital Status: Married    Spouse Name: N/A  . Number of Children: 1  . Years of Education: N/A   Occupational History  . disability    Social History Main Topics  . Smoking status: Never Smoker   . Smokeless tobacco: Never Used  . Alcohol Use: No  . Drug Use: No  . Sexual Activity:    Partners: Female    Birth Control/ Protection: None     Comment: spouse   Other Topics Concern  . None   Social History Narrative    Review of  Systems: As mentioned in HPI  Physical Exam: BP 146/75 mmHg  Pulse 74  Temp(Src) 98.1 F (36.7 C) (Oral)  Ht 5' 9" (1.753 m)  Wt 174 lb (78.926 kg)  BMI 25.68 kg/m2 General:   Alert and oriented. No distress noted. Pleasant and cooperative.  Head:  Normocephalic and atraumatic. Eyes:  Conjuctiva clear without scleral icterus. Abdomen:  +BS, soft, non-tender and non-distended. No rebound or guarding. No HSM or masses noted. Msk:  Symmetrical without gross deformities. Normal posture. Extremities:  Without edema. Neurologic:  Alert and  oriented x4;  grossly normal neurologically. Psych:  Alert and cooperative. Normal mood and affect.  

## 2014-11-04 NOTE — Assessment & Plan Note (Addendum)
69 year old male with history of questionable UC in the 1990s, with most recent colonoscopy in May 2016 with negative segmental biopsies and stool sampling. Persistent diarrhea despite supportive measures, but his weight has remained stable. Highly question a malabsorptive effect due to history of small bowel resection X 2. Interestingly, he states he has been referred to the [place] by his PCP for further GI work-up as well. Appears he may be having what a small bowel follow through, but it is unclear per his report. Will request records. Differentials include malabsorption, bile salt diarrhea, metformin side effect, unable to rule out pancreatic insufficiency. Will add fecal elastase test, request records from the [place], and change from Prilosec to Protonix to exclude any underlying med effect. Trial Imodium 2 mg each morning. May benefit from [place] in the future. Will review records first. 3 month follow-up.

## 2014-11-04 NOTE — Assessment & Plan Note (Signed)
Change from Prilosec to Protonix. Continue daily indefinitely. Next surveillance 2019.

## 2014-11-08 NOTE — Progress Notes (Signed)
cc'ed to pcp °

## 2014-11-18 LAB — PANCREATIC ELASTASE, FECAL: Pancreatic Elastase-1, Stool: >500 mcg/g

## 2014-11-29 NOTE — Progress Notes (Signed)
Quick Note:  LMOM to call. ______ 

## 2014-11-29 NOTE — Progress Notes (Signed)
Quick Note:  Negative fecal elastase test for pancreatic insufficiency.  How is patient doing? ______

## 2014-12-01 NOTE — Progress Notes (Signed)
Quick Note:  I called and informed pt. He said he is doing much better. He is taking Protonix now instead of the Omeprazole. He thinks maybe the Omeprazole was causing some of his diarrhea. Michela Pitcher he has 2 BM's daily now, but stool is soft and not watery and he feels much better. ______

## 2015-01-28 ENCOUNTER — Encounter (HOSPITAL_COMMUNITY): Payer: Self-pay | Admitting: *Deleted

## 2015-01-28 ENCOUNTER — Emergency Department (HOSPITAL_COMMUNITY)
Admission: EM | Admit: 2015-01-28 | Discharge: 2015-01-28 | Disposition: A | Discharge status: Home / Self Care | Payer: Medicare Other | Attending: Emergency Medicine | Admitting: Emergency Medicine

## 2015-01-28 DIAGNOSIS — M79671 Pain in right foot: Secondary | ICD-10-CM | POA: Insufficient documentation

## 2015-01-28 DIAGNOSIS — G8929 Other chronic pain: Secondary | ICD-10-CM | POA: Insufficient documentation

## 2015-01-28 DIAGNOSIS — Z86018 Personal history of other benign neoplasm: Secondary | ICD-10-CM | POA: Diagnosis not present

## 2015-01-28 DIAGNOSIS — M545 Low back pain: Secondary | ICD-10-CM | POA: Insufficient documentation

## 2015-01-28 DIAGNOSIS — Z880 Allergy status to penicillin: Secondary | ICD-10-CM | POA: Insufficient documentation

## 2015-01-28 DIAGNOSIS — Z79899 Other long term (current) drug therapy: Secondary | ICD-10-CM | POA: Diagnosis not present

## 2015-01-28 DIAGNOSIS — Z7982 Long term (current) use of aspirin: Secondary | ICD-10-CM | POA: Diagnosis not present

## 2015-01-28 DIAGNOSIS — R079 Chest pain, unspecified: Secondary | ICD-10-CM | POA: Diagnosis not present

## 2015-01-28 DIAGNOSIS — Z8619 Personal history of other infectious and parasitic diseases: Secondary | ICD-10-CM | POA: Insufficient documentation

## 2015-01-28 DIAGNOSIS — Z8659 Personal history of other mental and behavioral disorders: Secondary | ICD-10-CM | POA: Insufficient documentation

## 2015-01-28 DIAGNOSIS — K219 Gastro-esophageal reflux disease without esophagitis: Secondary | ICD-10-CM | POA: Diagnosis not present

## 2015-01-28 DIAGNOSIS — R739 Hyperglycemia, unspecified: Secondary | ICD-10-CM

## 2015-01-28 DIAGNOSIS — I10 Essential (primary) hypertension: Secondary | ICD-10-CM | POA: Diagnosis not present

## 2015-01-28 DIAGNOSIS — M546 Pain in thoracic spine: Secondary | ICD-10-CM | POA: Insufficient documentation

## 2015-01-28 DIAGNOSIS — E1165 Type 2 diabetes mellitus with hyperglycemia: Secondary | ICD-10-CM | POA: Insufficient documentation

## 2015-01-28 DIAGNOSIS — L299 Pruritus, unspecified: Secondary | ICD-10-CM | POA: Insufficient documentation

## 2015-01-28 LAB — I-STAT CHEM 8, ED
BUN: 24 mg/dL — ABNORMAL HIGH (ref 6–20)
Calcium, Ion: 1.13 mmol/L (ref 1.13–1.30)
Chloride: 99 mmol/L — ABNORMAL LOW (ref 101–111)
Creatinine, Ser: 1.1 mg/dL (ref 0.61–1.24)
Glucose, Bld: 207 mg/dL — ABNORMAL HIGH (ref 65–99)
HCT: 38 % — ABNORMAL LOW (ref 39.0–52.0)
Hemoglobin: 12.9 g/dL — ABNORMAL LOW (ref 13.0–17.0)
Potassium: 3.5 mmol/L (ref 3.5–5.1)
Sodium: 138 mmol/L (ref 135–145)
TCO2: 23 mmol/L (ref 0–100)

## 2015-01-28 NOTE — ED Provider Notes (Signed)
CSN: [id number]     Arrival date & time 01/28/15  1036 History  This chart was scribed for Donald Fraise, MD by Evelene Croon, ED Scribe. This patient was seen in room APA06/APA06 and the patient's care was started 11:07 AM.  Chief Complaint  Patient presents with  . multiple complaints    The history is provided by the patient. No language interpreter was used.   HPI Comments:  Donald Klein is a 69 y.o. male with a history of DM, neuropathy, and HTN, who presents to the Emergency Department complaining of intermittent shooting pain to the first 3 toes of his right foot for ~ 4 days. Pt states he has been applying a cream given to him at the  [place]  without relief. He denies injury to his foot. Pt notes history of the same pain but states this episode has been longer. Pt also complains of generalized pruritus, elevated BP, hyperglycemia, pain to his mid upper back and mild central CP this am but no SOB/diaphoresis reported. He denies fever, vomiting, dizziness, weakness and rash. No alleviating factors noted.    Past Medical History  Diagnosis Date  . HTN (hypertension)   . Chronic back pain   . PTSD (post-traumatic stress disorder)   . Diabetes mellitus   . GERD (gastroesophageal reflux disease)   . Barrett esophagus     Last EGD 05/27/08 Barrett's without dyplasia. EGD due 05/2011  . Helicobacter pylori gastritis 2009    treated  . Anxiety   . Depression   . Tubular adenoma of colon 10/08/10    Last colonosocpy 1 cecal TA  . Colitis     In 1980s, dx with UC by Dr. Humphrey Rolls at time of TCS, 1992 TCS, chronic colitis  . 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 Cholecystectomy    . 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 Colonoscopy  10/08/2010    Normal rectum/tubular adenoma/normal random biopsy. Next TCS  due 10/2015.  Marland Kitchen Esophagogastroduodenoscopy  07/31/2011    Dr. Doyce Para segment Barrett's esophagus s/p bx Hiatal hernia. Duodenal diverticulum. No dysplasia.Next EGD 07/2014.  Marland Kitchen Exploratory laparotomy w/ bowel resection  July 2014    [facility]: with lysis of adhesions, small bowel resection X 2, resection of mesenteric mass, appendectomy, path: fibromatosis   . Appendectomy  July 2014    at time of small bowel resection  . Colonoscopy N/A 09/30/2014    Dr. Gala Romney: Redunant colon . Status post segmental biopsy and stool sampling. Negative stool samples and colonic biopsies.   . Esophagogastroduodenoscopy N/A 09/30/2014    Dr. Gala Romney: Abnormal distal esophagus consistant with prior diagnosis of short segment Barrett's esophagus status post biopsy. 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 Hiatal hernia. Abnormal Gastric mucosa of uncertain significance status post gastric biopsy. path with +Barrett's esophagus but no dysplasia, mild chronic gastritis. Surveillance for Barrett's due in 2019   Family History  Problem Relation Age of Onset  . Heart attack Father     36  . Colon cancer Neg Hx    Social History  Substance Use Topics  . Smoking status: Never Smoker   . Smokeless tobacco: Never Used  . Alcohol Use: No    Review of Systems  Constitutional: Negative for fever.       + HTN + Hyperglycemia  Respiratory: Negative for shortness of breath.   Cardiovascular: Positive for chest pain.  Gastrointestinal: Negative for vomiting.  Musculoskeletal: Positive for myalgias (Right foot) and back pain.  Skin: Negative for rash.       + Generalized Pruritus   Neurological: Negative for dizziness and weakness.  All other systems reviewed and are negative.   Allergies  Lovenox and Penicillins  Home Medications   Prior to Admission medications   Medication Sig Start Date End Date Taking? Authorizing Provider  sulindac (CLINORIL) 150 MG tablet Take 150 mg by mouth. 01/12/15  Yes Historical  Provider, MD  amLODipine (NORVASC) 10 MG tablet Take 5 mg by mouth Daily. Take 1/2 daily 11/25/10   Historical Provider, MD  aspirin 81 MG tablet Take 81 mg by mouth daily.      Historical Provider, MD  Cholecalciferol (VITAMIN D) 2000 UNITS tablet Take 2,000 Units by mouth daily.    Historical Provider, MD  CRESTOR 20 MG tablet Take 1 tablet by mouth daily. 08/05/10   Historical Provider, MD  dicyclomine (BENTYL) 10 MG capsule Take 1 capsule (10 mg total) by mouth 4 (four) times daily -  before meals and at bedtime. 08/16/14   Orvil Feil, NP  hydrochlorothiazide 25 MG tablet Take 25 mg by mouth daily.      Historical Provider, MD  losartan (COZAAR) 100 MG tablet Take 100 mg by mouth daily.      Historical Provider, MD  LOVAZA 1 G capsule Take 1 capsule by mouth Twice daily. 08/05/10   Historical Provider, MD  metFORMIN (GLUCOPHAGE) 1000 MG tablet Take 1,000 mg by mouth 2 (two) times daily with a meal.      Historical Provider, MD  metoprolol (LOPRESSOR) 50 MG tablet Take 75-100 tablets by mouth Twice daily. 2 tablets in am ; 1.5 tablets in pm 07/31/10   Historical Provider, MD  Multiple Vitamin (MULTIVITAMIN) capsule Take 1 capsule by mouth daily.    Historical Provider, MD  omeprazole (PRILOSEC) 20 MG capsule TAKE ONE CAPSULE BY MOUTH 30MINS. BEFORE BREAKFAST AND EVENING MEAL DAILY 09/21/10   Mahala Menghini, PA-C  ONGLYZA 5 MG TABS tablet Take 1 tablet by mouth daily. 08/05/10   Historical Provider, MD  pantoprazole (PROTONIX) 40 MG tablet Take 1 tablet (40 mg total) by mouth daily. 11/02/14   Orvil Feil, NP  potassium chloride (K-DUR) 10 MEQ tablet Take 10 mEq by mouth daily.    Historical Provider, MD   BP 168/78 mmHg  Pulse 81  Temp(Src) 98.2 F (36.8 C) (Oral)  Resp 16  Ht 5\' 9"  (1.753 m)  Wt 172 lb (78.019 kg)  BMI 25.39 kg/m2  SpO2 100% Physical Exam  Nursing note and vitals reviewed. CONSTITUTIONAL: Well developed/well nourished HEAD: Normocephalic/atraumatic EYES: EOMI/PERRL ENMT:  Mucous membranes moist NECK: supple no meningeal signs SPINE/BACK:entire spine nontender CV: S1/S2 noted, no murmurs/rubs/gallops noted LUNGS: Lungs are clear to auscultation bilaterally, no apparent distress ABDOMEN: soft, nontender, no rebound or guarding, bowel sounds noted throughout abdomen GU:no cva tenderness NEURO: Pt is awake/alert/appropriate, moves all extremitiesx4.  No facial droop.   EXTREMITIES: pulses normal/equal, full ROM; no bruising or erythema; no signs of trauma to right foot; No significant tenderness to palpation  SKIN: warm, color normal, no rash noted PSYCH: no abnormalities of mood noted, alert and oriented to situation   ED Course  Procedures   DIAGNOSTIC STUDIES:  Oxygen Saturation is 100% on RA, normal by my interpretation.    COORDINATION OF CARE:  11:13 AM Discussed treatment plan with pt at bedside and pt agreed to plan. Pt here for multiple complaints He is well appearing His foot is unremarkable, he thinks it may be neuropathy Advised need for f/u with PCP for HTN and DM management He mentioned brief CP this morning while doing dishes, but no SOB/weakness reported, doubt ACS/PE at this time  Labs Review Labs Reviewed  I-STAT CHEM 8, ED - Abnormal; Notable for the following:    Chloride 99 (*)    BUN 24 (*)    Glucose, Bld 207 (*)    Hemoglobin 12.9 (*)    HCT 38.0 (*)    All other components within normal limits   I have personally reviewed and evaluated these ab results as part of my medical decision-making.   EKG Interpretation   Date/Time:  Saturday January 28 2015 11:24:57 EDT Ventricular Rate:  80 PR Interval:  160 QRS Duration: 79 QT Interval:  374 QTC Calculation: 431 R Axis:   -11 Text Interpretation:  Sinus rhythm Anteroseptal infarct, old Borderline T  abnormalities, inferior leads Baseline wander in lead(s) V5 artifact noted  Otherwise no significant change Confirmed by Christy Gentles  MD, Donald ([id number])  on 01/28/2015  11:30:59 AM      MDM   Final diagnoses:  Essential hypertension  Hyperglycemia  Right foot pain    Nursing notes including past medical history and social history reviewed and considered in documentation Labs/vital reviewed myself and considered during evaluation   I personally performed the services described in this documentation, which was scribed in my presence. The recorded information has been reviewed and is accurate.      Donald Fraise, MD 01/28/15 [phone]

## 2015-01-28 NOTE — ED Notes (Signed)
Pt states that he ran out of one of his metoprolol x 2-3 days ago and it may be Wednesday before he receives his others in the mail. Also states higher CBG's than normal with highest being 244. Pt states neuropathy pain to feet is his major complaints. Pt also states itching from head down.

## 2015-02-02 ENCOUNTER — Encounter: Payer: Self-pay | Admitting: Gastroenterology

## 2015-02-02 ENCOUNTER — Ambulatory Visit (INDEPENDENT_AMBULATORY_CARE_PROVIDER_SITE_OTHER): Payer: Medicare Other | Admitting: Gastroenterology

## 2015-02-02 VITALS — BP 136/78 | HR 79 | Temp 98.4°F | Ht 69.0 in | Wt 172.0 lb

## 2015-02-02 DIAGNOSIS — R197 Diarrhea, unspecified: Secondary | ICD-10-CM

## 2015-02-02 NOTE — Patient Instructions (Addendum)
Continue to take Protonix once each day.   You can try small dose of supplemental fiber such as Metamucil or Benefiber daily. If this makes your symptoms worse, let me know.   We will see you back in 4-6 months.

## 2015-02-02 NOTE — Progress Notes (Signed)
Referring Burgess Sheriff: Marjean Donna, MD Primary Care Physician:  Lanette Hampshire, MD  Primary GI: Dr. Gala Romney   Chief Complaint  Patient presents with  . Follow-up    HPI:   Donald Klein is a 69 y.o. 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 Remote questionable h/o UC diagnosed by Dr. Humphrey Rolls at time of TCS in the 1990s. Colonoscopy in 2012 without evidence of disease, and most recent colonoscopy in May 2016 with negative segmental biopsies and stool sampling. +Barrett esophagus on EGD, on 3-year-surveillance monitoring. In 2014 underwent exploratory laparotomy with lysis of adhesions, small bowel resection, resection of mesenteric mass, appy due to fibromatosis. Followed by [facility]. CT performed Sept 2016 at [facility] with new 3X4 cm mass in the root of the jejunal mesentery, consistent with recurrent desmoid/fibromatosis. Has seen radiation oncology who is hesitant to pursue radiation due to proximity of small bowel in the field. 3 month surveillance CT needed. Also saw gastroenterology at the [facility] for a second opinion. They felt likely metformin related, possible secondary to intestinal resection, questionable relation to hx of ulcerative colitis, with plans for CT.     Once a day Protonix. No blood in stool. Appetite is good. If goes a day without a BM will have several more the next day. Soft and watery.    Past Medical History  Diagnosis Date  . HTN (hypertension)   . Chronic back pain   . PTSD (post-traumatic stress disorder)   . Diabetes mellitus   . GERD (gastroesophageal reflux disease)   . Barrett esophagus     Last EGD 05/27/08 Barrett's without dyplasia. EGD due 05/2011  . Helicobacter pylori gastritis 2009    treated  . Anxiety   . Depression   . Tubular adenoma of colon 10/08/10    Last colonosocpy 1 cecal TA  . Colitis     In 1980s, dx with UC by Dr. Humphrey Rolls at time of TCS, 1992 TCS, chronic colitis    . 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 Cholecystectomy    . 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 Colonoscopy  10/08/2010    Normal rectum/tubular adenoma/normal random biopsy. Next TCS due 10/2015.  Marland Kitchen Esophagogastroduodenoscopy  07/31/2011    Dr. Doyce Para segment Barrett's esophagus s/p bx Hiatal hernia. Duodenal diverticulum. No dysplasia.Next EGD 07/2014.  Marland Kitchen Exploratory laparotomy w/ bowel resection  July 2014    [facility]: with lysis of adhesions, small bowel resection X 2, resection of mesenteric mass, appendectomy, path: fibromatosis   . Appendectomy  July 2014    at time of small bowel resection  . Colonoscopy N/A 09/30/2014    Dr. Gala Romney: Redunant colon . Status post segmental biopsy and stool sampling. Negative stool samples and colonic biopsies.   . Esophagogastroduodenoscopy N/A 09/30/2014    Dr. Gala Romney: Abnormal distal esophagus consistant with prior diagnosis of short segment Barrett's esophagus status post biopsy. 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 Hiatal hernia. Abnormal Gastric mucosa of uncertain significance status post gastric biopsy. path with +Barrett's esophagus but no dysplasia, mild chronic gastritis. Surveillance for Barrett's due in 2019    Current Outpatient Prescriptions  Medication Sig Dispense Refill  . amLODipine (NORVASC) 10 MG tablet Take 5 mg by mouth  Daily. Take 1/2 daily    . ascorbic acid (VITAMIN C) 250 MG tablet Take 250 mg by mouth daily.    . Cholecalciferol (VITAMIN D) 2000 UNITS tablet Take 2,000 Units by mouth daily.    . CRESTOR 20 MG tablet Take 1 tablet by mouth daily.    . ferrous sulfate 325 (65 FE) MG tablet Take 325 mg by mouth daily with breakfast.    . glipiZIDE (GLUCOTROL) 5 MG tablet Take 5 mg by mouth daily before breakfast.    .  hydrochlorothiazide 25 MG tablet Take 25 mg by mouth daily.      Marland Kitchen losartan (COZAAR) 100 MG tablet Take 100 mg by mouth daily.      Marland Kitchen LOVAZA 1 G capsule Take 1 capsule by mouth Twice daily.    . metFORMIN (GLUCOPHAGE) 1000 MG tablet Take 1,000 mg by mouth 2 (two) times daily with a meal.      . metoprolol (LOPRESSOR) 50 MG tablet Take 75-100 tablets by mouth Twice daily. 2 tablets in am ; 1.5 tablets in pm    . ONGLYZA 5 MG TABS tablet Take 1 tablet by mouth daily.    . pantoprazole (PROTONIX) 40 MG tablet Take 1 tablet (40 mg total) by mouth daily. 90 tablet 3  . potassium chloride (K-DUR) 10 MEQ tablet Take 10 mEq by mouth daily.    . sulindac (CLINORIL) 150 MG tablet Take 150 mg by mouth.    . dicyclomine (BENTYL) 10 MG capsule Take 1 capsule (10 mg total) by mouth 4 (four) times daily -  before meals and at bedtime. (Patient not taking: Reported on 01/28/2015) 120 capsule 3  . omeprazole (PRILOSEC) 20 MG capsule TAKE ONE CAPSULE BY MOUTH 30MINS. BEFORE BREAKFAST AND EVENING MEAL DAILY (Patient not taking: Reported on 01/28/2015) 60 capsule 5   No current facility-administered medications for this visit.    Allergies as of 02/02/2015 - Review Complete 02/02/2015  Allergen Reaction Noted  . Lovenox [enoxaparin sodium] Hives and Rash 09/30/2014  . Penicillins Rash     Family History  Problem Relation Age of Onset  . Heart attack Father     49  . Colon cancer Neg Hx     Social History   Social History  . Marital Status: Married    Spouse Name: N/A  . Number of Children: 1  . Years of Education: N/A   Occupational History  . disability    Social History Main Topics  . Smoking status: Never Smoker   . Smokeless tobacco: Never Used  . Alcohol Use: No  . Drug Use: No  . Sexual Activity:    Partners: Female    Museum/gallery curator: None     Comment: spouse   Other Topics Concern  . None   Social History Narrative    Review of Systems: Negative unless mentioned in  HPI   Physical Exam: BP 136/78 mmHg  Pulse 79  Temp(Src) 98.4 F (36.9 C)  Ht 5\' 9"  (1.753 m)  Wt 172 lb (78.019 kg)  BMI 25.39 kg/m2 General:   Alert and oriented. No distress noted. Pleasant and cooperative.  Head:  Normocephalic and atraumatic. Eyes:  Conjuctiva clear without scleral icterus. Abdomen:  +BS, soft, non-tender and non-distended. No rebound or guarding. No HSM or masses noted. Msk:  Symmetrical without gross deformities. Normal posture. Extremities:  Without edema. Neurologic:  Alert and  oriented x4;  grossly normal neurologically. Psych:  Alert and cooperative. Normal mood and affect.

## 2015-02-09 NOTE — Assessment & Plan Note (Signed)
69 year old male with chronic diarrhea actually improved since last visit. History of questionable UC in the 1990s with most recent colonoscopy May 2016 unrevealing. Differentials including malabsorptive effect due to history of small bowel resection X 2. Somewhat complicated case. Metformin side effect also high on differentials, bile salt diarrhea. He know tells me there are spans of days he has no BM, then followed by diarrhea. Add supplemental fiber, continue Protonix daily (avoid Prilosec due to possible med effect), and return in 4-6 weeks.

## 2015-02-09 NOTE — Progress Notes (Signed)
CC'D TO PCP °

## 2015-06-07 ENCOUNTER — Other Ambulatory Visit: Payer: Self-pay

## 2015-06-07 ENCOUNTER — Ambulatory Visit (INDEPENDENT_AMBULATORY_CARE_PROVIDER_SITE_OTHER): Payer: Medicare PPO | Admitting: Gastroenterology

## 2015-06-07 ENCOUNTER — Encounter: Payer: Self-pay | Admitting: Gastroenterology

## 2015-06-07 VITALS — BP 158/78 | HR 88 | Temp 97.0°F | Ht 69.0 in | Wt 172.4 lb

## 2015-06-07 DIAGNOSIS — K869 Disease of pancreas, unspecified: Secondary | ICD-10-CM | POA: Diagnosis not present

## 2015-06-07 DIAGNOSIS — R197 Diarrhea, unspecified: Secondary | ICD-10-CM

## 2015-06-07 DIAGNOSIS — K2270 Barrett's esophagus without dysplasia: Secondary | ICD-10-CM | POA: Diagnosis not present

## 2015-06-07 MED ORDER — PANTOPRAZOLE SODIUM 40 MG PO TBEC: 40.0000 mg | DELAYED_RELEASE_TABLET | Freq: Two times a day (BID) | ORAL | Status: DC

## 2015-06-07 NOTE — Assessment & Plan Note (Signed)
On imaging by [facility] he has an unchanged 33mm cystic lesion in head of pancreas. I would feel more comfortable obtaining an MRI now. Fecal elastase normal in past. He has no concerning symptoms. May need EUS in future, but surgery at [facility] is upcoming in March 2017.

## 2015-06-07 NOTE — Progress Notes (Signed)
Referring Provider: Marjean Donna, MD Primary Care Physician:  [name], MD  Primary GI: Dr. Gala Romney   Chief Complaint  Patient presents with  . Follow-up    HPI:   Donald Klein is a 70 y.o. 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 Remote questionable h/o UC diagnosed by Dr. Humphrey Rolls at time of TCS in the 1990s. Colonoscopy in 2012 without evidence of disease, and most recent colonoscopy in May 2016 with negative segmental biopsies and stool sampling. +Barrett esophagus on EGD, on 3-year-surveillance monitoring. In 2014 underwent exploratory laparotomy with lysis of adhesions, small bowel resection, resection of mesenteric mass, appy due to fibromatosis. Followed by [facility]. CT performed Sept 2016 at [facility] with new 3X4 cm mass in the root of the jejunal mesentery, consistent with recurrent desmoid/fibromatosis. Has seen radiation oncology who is hesitant to pursue radiation due to proximity of small bowel in the field. Also saw gastroenterology at the [facility] for a second opinion. They felt likely metformin related, possible secondary to intestinal resection, questionable relation to hx of ulcerative colitis. Last seen Sept 2016 and changed from Prilosec to Protonix to rule out any possible med effect. Supplemental fiber recommended.   He has since seen [facility] again as of Dec 2016. 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 He will be undergoing surgery in March 2017.   Has loose stools on and off. Yesterday had 3 BMs back to back and then today is fine. Some days will not have a BM. Then, it makes up for it "the next day". Feels like it is related to his medication he is taking (metformin). Spoke with his PCP about it. Patient states he was told it  didn't need to be changed. On Metamucil every day. Not taking sulindac any longer (prescribed by [facility]).     Past Medical History  Diagnosis Date  . HTN (hypertension)   . Chronic back pain   . PTSD (post-traumatic stress disorder)   . Diabetes mellitus   . GERD (gastroesophageal reflux disease)   . Barrett esophagus     Last EGD 05/27/08 Barrett's without dyplasia. EGD due 05/2011  . Helicobacter pylori gastritis 2009    treated  . Anxiety   . Depression   . Tubular adenoma of colon 10/08/10    Last colonosocpy 1 cecal TA  . Colitis     In 1980s, dx with UC by Dr. Humphrey Rolls at time of TCS, 1992 TCS, chronic colitis  . 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 Cholecystectomy    . 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 Colonoscopy  10/08/2010    Normal rectum/tubular adenoma/normal random biopsy. Next TCS due 10/2015.  Marland Kitchen Esophagogastroduodenoscopy  07/31/2011    Dr. Doyce Para segment Barrett's esophagus s/p bx Hiatal hernia. Duodenal diverticulum. No dysplasia.Next EGD 07/2014.  Marland Kitchen Exploratory laparotomy w/ bowel resection  July 2014    [facility]: with lysis of adhesions, small bowel resection X 2, resection of mesenteric mass, appendectomy, path: fibromatosis   . Appendectomy  July 2014    at time  of small bowel resection  . Colonoscopy N/A 09/30/2014    Dr. Gala Romney: Redunant colon . Status post segmental biopsy and stool sampling. Negative stool samples and colonic biopsies.   . Esophagogastroduodenoscopy N/A 09/30/2014    Dr. Gala Romney: Abnormal distal esophagus consistant with prior diagnosis of short segment Barrett's esophagus status post biopsy. 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 Hiatal hernia. Abnormal Gastric mucosa of uncertain significance status post gastric biopsy. path with +Barrett's  esophagus but no dysplasia, mild chronic gastritis. Surveillance for Barrett's due in 2019    Current Outpatient Prescriptions  Medication Sig Dispense Refill  . amLODipine (NORVASC) 10 MG tablet Take 5 mg by mouth Daily. Take 1/2 daily    . ascorbic acid (VITAMIN C) 250 MG tablet Take 250 mg by mouth daily.    . Cholecalciferol (VITAMIN D) 2000 UNITS tablet Take 2,000 Units by mouth daily.    . CRESTOR 20 MG tablet Take 1 tablet by mouth daily.    Marland Kitchen dicyclomine (BENTYL) 10 MG capsule Take 1 capsule (10 mg total) by mouth 4 (four) times daily -  before meals and at bedtime. 120 capsule 3  . ferrous sulfate 325 (65 FE) MG tablet Take 325 mg by mouth daily with breakfast.    . glipiZIDE (GLUCOTROL) 5 MG tablet Take 5 mg by mouth daily before breakfast.    . hydrochlorothiazide 25 MG tablet Take 25 mg by mouth daily.      Marland Kitchen LOVAZA 1 G capsule Take 1 capsule by mouth Twice daily.    . metFORMIN (GLUCOPHAGE) 1000 MG tablet Take 1,000 mg by mouth 2 (two) times daily with a meal.      . metoprolol (LOPRESSOR) 50 MG tablet Take 75-100 tablets by mouth Twice daily. 2 tablets in am ; 1.5 tablets in pm    . ONGLYZA 5 MG TABS tablet Take 1 tablet by mouth daily.    . pantoprazole (PROTONIX) 40 MG tablet Take 1 tablet (40 mg total) by mouth daily. 90 tablet 3  . potassium chloride (K-DUR) 10 MEQ tablet Take 10 mEq by mouth daily.    . sulindac (CLINORIL) 150 MG tablet Take 150 mg by mouth.    . losartan (COZAAR) 100 MG tablet Take 100 mg by mouth daily. Reported on 06/07/2015    . omeprazole (PRILOSEC) 20 MG capsule TAKE ONE CAPSULE BY MOUTH 30MINS. BEFORE BREAKFAST AND EVENING MEAL DAILY (Patient not taking: Reported on 01/28/2015) 60 capsule 5   No current facility-administered medications for this visit.    Allergies as of 06/07/2015 - Review Complete 06/07/2015  Allergen Reaction Noted  . Lovenox [enoxaparin sodium] Hives and Rash 09/30/2014  . Penicillins Rash     Family History  Problem  Relation Age of Onset  . Heart attack Father     67  . Colon cancer Neg Hx     Social History   Social History  . Marital Status: Married    Spouse Name: N/A  . Number of Children: 1  . Years of Education: N/A   Occupational History  . disability    Social History Main Topics  . Smoking status: Never Smoker   . Smokeless tobacco: Never Used  . Alcohol Use: No  . Drug Use: No  . Sexual Activity:    Partners: Female    Museum/gallery curator: None     Comment: spouse   Other Topics Concern  . None   Social History Narrative    Review of Systems: As mentioned in  HPI   Physical Exam: BP 158/78 mmHg  Pulse 88  Temp(Src) 97 F (36.1 C)  Ht 5\' 9"  (1.753 m)  Wt 172 lb 6.4 oz (78.2 kg)  BMI 25.45 kg/m2 General:   Alert and oriented. No distress noted. Pleasant and cooperative.  Head:  Normocephalic and atraumatic. Eyes:  Conjuctiva clear without scleral icterus. Abdomen:  +BS, soft, non-tender and non-distended. No rebound or guarding. No HSM or masses noted. Msk:  Symmetrical without gross deformities. Normal posture. Extremities:  Without edema. Neurologic:  Alert and  oriented x4;  grossly normal neurologically. Psych:  Alert and cooperative. Normal mood and affect.

## 2015-06-07 NOTE — Assessment & Plan Note (Signed)
Some breakthrough reflux on once daily PPI. Likely dietary related. Increase Protonix to BID for one month then back to once daily. Next Barrett's surveillance in 2019

## 2015-06-07 NOTE — Progress Notes (Signed)
cc'ed to pcp °

## 2015-06-07 NOTE — Assessment & Plan Note (Signed)
69 year old male with history of chronic diarrhea that seems to have improved actually. History of questionable UC in the 1990s with most recent colonoscopy in may 2016 unrevealing. He has had somewhat of a complicated past GI history as noted in HPI. Metformin side effect remains in differentials. However, he reports skipping a few days at a time between BMs; therefore, I would like for him to continue fiber and utilize Miralax on any given day as necessary. Continue Protonix once daily. Return in 6 months. He will be undergoing surgery at [facility] for recurrence of small bowel tumor.

## 2015-06-07 NOTE — Patient Instructions (Signed)
I have ordered an MRI of your pancreas.   I have also sent in Protonix to take twice a day for 1 month and then back to once a day.   Continue Metamucil daily. You may take Miralax 1/2 capful to 1 capful each day as needed for when you feel constipated.  We will see you in 6 months!

## 2015-06-12 ENCOUNTER — Telehealth: Payer: Self-pay | Admitting: Internal Medicine

## 2015-06-12 NOTE — Telephone Encounter (Signed)
Pt called to say that he went to [facility] and was advised that he did not need a MRI because they are aware of the cyst on his pancreas and they have been keeping a check on it and there has been no change since Sept 2015. Patient is asking to cancel his MRI

## 2015-06-12 NOTE — Telephone Encounter (Signed)
Routing to [place] whom saw pt last

## 2015-06-13 ENCOUNTER — Emergency Department (HOSPITAL_COMMUNITY)
Admission: EM | Admit: 2015-06-13 | Discharge: 2015-06-13 | Disposition: A | Discharge status: Home / Self Care | Payer: Medicare PPO | Attending: Emergency Medicine | Admitting: Emergency Medicine

## 2015-06-13 ENCOUNTER — Encounter (HOSPITAL_COMMUNITY): Payer: Self-pay

## 2015-06-13 ENCOUNTER — Emergency Department (HOSPITAL_COMMUNITY): Payer: Medicare PPO

## 2015-06-13 DIAGNOSIS — Z86018 Personal history of other benign neoplasm: Secondary | ICD-10-CM | POA: Diagnosis not present

## 2015-06-13 DIAGNOSIS — M546 Pain in thoracic spine: Secondary | ICD-10-CM | POA: Diagnosis not present

## 2015-06-13 DIAGNOSIS — G8929 Other chronic pain: Secondary | ICD-10-CM | POA: Diagnosis not present

## 2015-06-13 DIAGNOSIS — K219 Gastro-esophageal reflux disease without esophagitis: Secondary | ICD-10-CM | POA: Diagnosis not present

## 2015-06-13 DIAGNOSIS — Z880 Allergy status to penicillin: Secondary | ICD-10-CM | POA: Diagnosis not present

## 2015-06-13 DIAGNOSIS — Z8619 Personal history of other infectious and parasitic diseases: Secondary | ICD-10-CM | POA: Diagnosis not present

## 2015-06-13 DIAGNOSIS — Z79899 Other long term (current) drug therapy: Secondary | ICD-10-CM | POA: Diagnosis not present

## 2015-06-13 DIAGNOSIS — F329 Major depressive disorder, single episode, unspecified: Secondary | ICD-10-CM | POA: Insufficient documentation

## 2015-06-13 DIAGNOSIS — Z7984 Long term (current) use of oral hypoglycemic drugs: Secondary | ICD-10-CM | POA: Insufficient documentation

## 2015-06-13 DIAGNOSIS — I10 Essential (primary) hypertension: Secondary | ICD-10-CM | POA: Diagnosis not present

## 2015-06-13 DIAGNOSIS — E119 Type 2 diabetes mellitus without complications: Secondary | ICD-10-CM | POA: Insufficient documentation

## 2015-06-13 DIAGNOSIS — F419 Anxiety disorder, unspecified: Secondary | ICD-10-CM | POA: Diagnosis not present

## 2015-06-13 LAB — BASIC METABOLIC PANEL
Anion gap: 10 (ref 5–15)
BUN: 16 mg/dL (ref 6–20)
CO2: 30 mmol/L (ref 22–32)
Calcium: 8.9 mg/dL (ref 8.9–10.3)
Chloride: 101 mmol/L (ref 101–111)
Creatinine, Ser: 1.39 mg/dL — ABNORMAL HIGH (ref 0.61–1.24)
GFR calc Af Amer: 57 mL/min — ABNORMAL LOW (ref >60)
GFR calc non Af Amer: 49 mL/min — ABNORMAL LOW (ref >60)
Glucose, Bld: 235 mg/dL — ABNORMAL HIGH (ref 65–99)
Potassium: 3.1 mmol/L — ABNORMAL LOW (ref 3.5–5.1)
Sodium: 141 mmol/L (ref 135–145)

## 2015-06-13 LAB — CBC WITH DIFFERENTIAL/PLATELET
Basophils Absolute: 0 10*3/uL (ref 0.0–0.1)
Basophils Relative: 0 %
Eosinophils Absolute: 0.2 10*3/uL (ref 0.0–0.7)
Eosinophils Relative: 3 %
HCT: 37.2 % — ABNORMAL LOW (ref 39.0–52.0)
Hemoglobin: 13.2 g/dL (ref 13.0–17.0)
Lymphocytes Relative: 48 %
Lymphs Abs: 2.8 10*3/uL (ref 0.7–4.0)
MCH: 29.5 pg (ref 26.0–34.0)
MCHC: 35.5 g/dL (ref 30.0–36.0)
MCV: 83 fL (ref 78.0–100.0)
Monocytes Absolute: 0.5 10*3/uL (ref 0.1–1.0)
Monocytes Relative: 8 %
Neutro Abs: 2.4 10*3/uL (ref 1.7–7.7)
Neutrophils Relative %: 41 %
Platelets: 208 10*3/uL (ref 150–400)
RBC: 4.48 MIL/uL (ref 4.22–5.81)
RDW: 13.7 % (ref 11.5–15.5)
WBC: 5.9 10*3/uL (ref 4.0–10.5)

## 2015-06-13 LAB — I-STAT TROPONIN, ED: Troponin i, poc: 0.02 ng/mL (ref 0.00–0.08)

## 2015-06-13 MED ORDER — TRAMADOL HCL 50 MG PO TABS: 50.0000 mg | ORAL_TABLET | Freq: Four times a day (QID) | ORAL | Status: DC | PRN

## 2015-06-13 NOTE — ED Notes (Signed)
Pt c/o intermittent bilateral rib pain x 1 year.  Denies injury.  Reports intermittent nausea as well.  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

## 2015-06-13 NOTE — Discharge Instructions (Signed)

## 2015-06-13 NOTE — ED Provider Notes (Signed)
CSN: [id number]     Arrival date & time 06/13/15  1242 History   First MD Initiated Contact with Patient 06/13/15 1309     Chief Complaint  Patient presents with  . rib pain    HPI Patient presents to the emergency room with complaints of bilateral rib pain for the last year. Patient states the pain is intermittent. It's sharp in the posterior aspects of both sides of his mid thoracic rib area. He wonders if he has broken ribs. The pain will come and go. Certainly movements seem to exacerbate it. He rides a motorcycle but is noticed that whenever he goes over bumps it's very uncomfortable. He denies any trouble with fever or coughing. Denies any trouble shortness of breath.  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 He has had some trouble with intermittent abdominal cramping and loose stools and is not sure if this is related to the tumor. He has not had any trouble recently. He denies any abdominal pain. No fevers or chills. No vomiting. Past Medical History  Diagnosis Date  . HTN (hypertension)   . Chronic back pain   . PTSD (post-traumatic stress disorder)   . Diabetes mellitus   . GERD (gastroesophageal reflux disease)   . Barrett esophagus     Last EGD 05/27/08 Barrett's without dyplasia. EGD due 05/2011  . Helicobacter pylori gastritis 2009    treated  . Anxiety   . Depression   . Tubular adenoma of colon 10/08/10    Last colonosocpy 1 cecal TA  . Colitis     In 1980s, dx with UC by Dr. Humphrey Rolls at time of TCS, 1992 TCS, chronic colitis  . 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 Cholecystectomy    . 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 Colonoscopy  10/08/2010    Normal rectum/tubular adenoma/normal random biopsy. Next TCS due 10/2015.  Marland Kitchen  Esophagogastroduodenoscopy  07/31/2011    Dr. Doyce Para segment Barrett's esophagus s/p bx Hiatal hernia. Duodenal diverticulum. No dysplasia.Next EGD 07/2014.  Marland Kitchen Exploratory laparotomy w/ bowel resection  July 2014    [facility]: with lysis of adhesions, small bowel resection X 2, resection of mesenteric mass, appendectomy, path: fibromatosis   . Appendectomy  July 2014    at time of small bowel resection  . Colonoscopy N/A 09/30/2014    Dr. Gala Romney: Redunant colon . Status post segmental biopsy and stool sampling. Negative stool samples and colonic biopsies.   . Esophagogastroduodenoscopy N/A 09/30/2014    Dr. Gala Romney: Abnormal distal esophagus consistant with prior diagnosis of short segment Barrett's esophagus status post biopsy. 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 Hiatal hernia. Abnormal Gastric mucosa of uncertain significance status post gastric biopsy. path with +Barrett's esophagus but no dysplasia, mild chronic gastritis. Surveillance for Barrett's due in 2019   Family History  Problem Relation Age of Onset  . Heart attack Father     28  . Colon cancer Neg Hx    Social History  Substance Use Topics  . Smoking status: Never Smoker   . Smokeless tobacco: Never Used  . Alcohol Use: No    Review of Systems  All other systems reviewed and are negative.     Allergies  Lovenox and Penicillins  Home Medications  Prior to Admission medications   Medication Sig Start Date End Date Taking? Authorizing Provider  Cholecalciferol (VITAMIN D) 2000 UNITS tablet Take 2,000 Units by mouth daily.   Yes Historical Provider, MD  glipiZIDE (GLUCOTROL) 5 MG tablet Take 5 mg by mouth daily before breakfast.   Yes Historical Provider, MD  hydrochlorothiazide 25 MG tablet Take 25 mg by mouth daily.     Yes Historical Provider, MD  LOVAZA 1 G capsule Take 1 capsule by mouth Twice daily. 08/05/10  Yes Historical Provider, MD  metFORMIN (GLUCOPHAGE) 1000 MG tablet Take 1,000 mg by mouth 2  (two) times daily with a meal.     Yes Historical Provider, MD  metoprolol (LOPRESSOR) 50 MG tablet Take 75-100 tablets by mouth Twice daily. 2 tablets in am ; 1.5 tablets in pm 07/31/10  Yes Historical Provider, MD  nitroGLYCERIN (NITROSTAT) 0.4 MG SL tablet Place 0.4 mg under the tongue every 5 (five) minutes as needed for chest pain.   Yes Historical Provider, MD  amLODipine (NORVASC) 10 MG tablet Take 5 mg by mouth Daily. Take 1/2 daily 11/25/10   Historical Provider, MD  ascorbic acid (VITAMIN C) 250 MG tablet Take 250 mg by mouth daily.    Historical Provider, MD  CRESTOR 20 MG tablet Take 1 tablet by mouth every morning.  08/05/10   Historical Provider, MD  dicyclomine (BENTYL) 10 MG capsule Take 1 capsule (10 mg total) by mouth 4 (four) times daily -  before meals and at bedtime. 08/16/14   Orvil Feil, NP  ferrous sulfate 325 (65 FE) MG tablet Take 325 mg by mouth daily with breakfast.    Historical Provider, MD  ONGLYZA 5 MG TABS tablet Take 1 tablet by mouth daily. 08/05/10   Historical Provider, MD  pantoprazole (PROTONIX) 40 MG tablet Take 1 tablet (40 mg total) by mouth 2 (two) times daily before a meal. Patient not taking: Reported on 06/13/2015 06/07/15   Orvil Feil, NP  potassium chloride (K-DUR) 10 MEQ tablet Take 10 mEq by mouth daily.    Historical Provider, MD  traMADol (ULTRAM) 50 MG tablet Take 1 tablet (50 mg total) by mouth every 6 (six) hours as needed. 06/13/15   Dorie Rank, MD   BP 145/76 mmHg  Pulse 88  Temp(Src) 98.9 F (37.2 C) (Tympanic)  Resp 18  Ht 5\' 9"  (1.753 m)  Wt 74.844 kg  BMI 24.36 kg/m2  SpO2 100% Physical Exam  Constitutional: He appears well-developed and well-nourished. No distress.  HENT:  Head: Normocephalic and atraumatic.  Right Ear: External ear normal.  Left Ear: External ear normal.  Eyes: Conjunctivae are normal. Right eye exhibits no discharge. Left eye exhibits no discharge. No scleral icterus.  Neck: Neck supple. No tracheal deviation present.   Cardiovascular: Normal rate, regular rhythm and intact distal pulses.   Pulmonary/Chest: Effort normal and breath sounds normal. No stridor. No respiratory distress. He has no wheezes. He has no rales. He exhibits no tenderness.  Abdominal: Soft. Bowel sounds are normal. He exhibits no distension. There is no tenderness. There is no rebound and no guarding.  Musculoskeletal: He exhibits no edema or tenderness.  No spinal tenderness  Neurological: He is alert. He has normal strength. No cranial nerve deficit (no facial droop, extraocular movements intact, no slurred speech) or sensory deficit. He exhibits normal muscle tone. He displays no seizure activity. Coordination normal.  Skin: Skin is warm and dry. No rash noted.  Psychiatric: He has a normal  mood and affect.  Nursing note and vitals reviewed.   ED Course  Procedures (including critical care time) Labs Review Labs Reviewed  CBC WITH DIFFERENTIAL/PLATELET - Abnormal; Notable for the following:    HCT 37.2 (*)    All other components within normal limits  BASIC METABOLIC PANEL - Abnormal; Notable for the following:    Potassium 3.1 (*)    Glucose, Bld 235 (*)    Creatinine, Ser 1.39 (*)    GFR calc non Af Amer 49 (*)    GFR calc Af Amer 57 (*)    All other components within normal limits  I-STAT TROPOININ, ED    Imaging Review Dg Chest 2 View  06/13/2015  CLINICAL DATA:  BILATERAL rib pain off and on for 1 year, no known injury, history hypertension, diabetes mellitus, GERD EXAM: CHEST  2 VIEW COMPARISON:  06/14/2013 FINDINGS: Normal heart size, mediastinal contours, and pulmonary vascularity. Lungs clear. No pulmonary infiltrate, pleural effusion or pneumothorax. Minimal endplate spur formation thoracic spine. IMPRESSION: No acute abnormalities. Electronically Signed   By: Lavonia Dana M.D.   On: 06/13/2015 13:50      EKG Interpretation   Date/Time:  Tuesday June 13 2015 15:03:25 EST Ventricular Rate:  69 PR Interval:   154 QRS Duration: 95 QT Interval:  423 QTC Calculation: 453 R Axis:   0 Text Interpretation:  Sinus rhythm Abnormal R-wave progression, early  transition Nonspecific T abnormalities, lateral leads Baseline wander in  lead(s) III aVL aVF V1 V3 No significant change since last tracing  Confirmed by Breelyn Icard  MD-J, Lura Falor [id number]) on 06/13/2015 3:43:42 PM      MDM   Final diagnoses:  Midline thoracic back pain    Labs and xrays reviewed.  Mild electrolyte abnormalities that are non contributory.  Cardiac enzymes and CBC normal.  No acute findings on xray.  Pts pain has been going for almost a year.  Suspect musculoskeletal etiology.  Dc home with ultram.  Follow up with PCP    Dorie Rank, MD 06/13/15 [phone]

## 2015-06-22 NOTE — Telephone Encounter (Signed)
OK. Just need to ensure Mina [facility] is following this. I will leave that to [facility] to follow.

## 2015-06-23 ENCOUNTER — Ambulatory Visit (HOSPITAL_COMMUNITY): Admission: RE | Admit: 2015-06-23 | Payer: Medicare PPO | Source: Ambulatory Visit

## 2015-07-05 HISTORY — PX: LAPAROTOMY: SHX154

## 2015-07-14 ENCOUNTER — Ambulatory Visit (HOSPITAL_COMMUNITY)
Admission: RE | Admit: 2015-07-14 | Discharge: 2015-07-14 | Disposition: A | Discharge status: Home / Self Care | Payer: Medicare PPO | Source: Ambulatory Visit | Attending: Neurology | Admitting: Neurology

## 2015-07-14 ENCOUNTER — Other Ambulatory Visit: Payer: Self-pay | Admitting: Neurology

## 2015-07-14 DIAGNOSIS — M549 Dorsalgia, unspecified: Secondary | ICD-10-CM | POA: Diagnosis present

## 2015-07-14 DIAGNOSIS — M47814 Spondylosis without myelopathy or radiculopathy, thoracic region: Secondary | ICD-10-CM | POA: Diagnosis not present

## 2015-08-10 DIAGNOSIS — R5383 Other fatigue: Secondary | ICD-10-CM | POA: Insufficient documentation

## 2015-08-10 DIAGNOSIS — R195 Other fecal abnormalities: Secondary | ICD-10-CM | POA: Insufficient documentation

## 2015-08-17 DIAGNOSIS — C49A2 Gastrointestinal stromal tumor of stomach: Secondary | ICD-10-CM | POA: Insufficient documentation

## 2015-10-30 DIAGNOSIS — G8929 Other chronic pain: Secondary | ICD-10-CM | POA: Diagnosis not present

## 2015-10-30 DIAGNOSIS — K2270 Barrett's esophagus without dysplasia: Secondary | ICD-10-CM | POA: Diagnosis not present

## 2015-10-30 DIAGNOSIS — F4312 Post-traumatic stress disorder, chronic: Secondary | ICD-10-CM | POA: Diagnosis not present

## 2015-10-30 DIAGNOSIS — E1140 Type 2 diabetes mellitus with diabetic neuropathy, unspecified: Secondary | ICD-10-CM | POA: Diagnosis not present

## 2015-10-30 DIAGNOSIS — E785 Hyperlipidemia, unspecified: Secondary | ICD-10-CM | POA: Diagnosis not present

## 2015-10-30 DIAGNOSIS — M545 Low back pain: Secondary | ICD-10-CM | POA: Diagnosis not present

## 2015-10-30 DIAGNOSIS — I10 Essential (primary) hypertension: Secondary | ICD-10-CM | POA: Diagnosis not present

## 2015-12-07 ENCOUNTER — Ambulatory Visit (INDEPENDENT_AMBULATORY_CARE_PROVIDER_SITE_OTHER): Payer: Medicare PPO | Admitting: Gastroenterology

## 2015-12-07 ENCOUNTER — Encounter: Payer: Self-pay | Admitting: Gastroenterology

## 2015-12-07 VITALS — BP 135/74 | HR 64 | Temp 97.3°F | Ht 69.0 in | Wt 163.4 lb

## 2015-12-07 DIAGNOSIS — K2270 Barrett's esophagus without dysplasia: Secondary | ICD-10-CM

## 2015-12-07 NOTE — Assessment & Plan Note (Signed)
Continue Protonix BID. Next surveillance in 2019.  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 Most recently, he reports anemia that has been identified through [place]. I do not have records at the time of the visit. He also notes heme positive stool without overt GI bleeding. Unfortunately, it appears his care may be fragmented, as he is referred to another GI practice in [place] per his report (I am unsure if this is because of [facility] requirements or not). This has happened before, and I discussed with patient that it is not a matter of where he is seen but rather that he receives continuity of care wherever that may be. I have asked him to let the [place] know that he is seen by [facility], and we will request the most recent labs as well. However, he states that he desires to go see the GI practice for an opinion but "will do any procedures with you". At this point, we will follow as needed, but I am concerned that his care is fragmented and discussed this with him. He still desires to see the GI practice in [place] but is fully aware that we have been his primary GI.   Next Barrett's surveillance 2019.

## 2015-12-07 NOTE — Progress Notes (Signed)
CC'ED TO PCP 

## 2015-12-07 NOTE — Patient Instructions (Signed)
I will request the lab work from [facility]. Please call them and let them know that you see [place] as a GI practice. It gets confusing when there are lots of different providers taking care of the same thing. It will be easier to keep all of your GI care and needs here at our practice if you would like to do that.   I will let you know the next step once I get those results.

## 2015-12-07 NOTE — Progress Notes (Signed)
Referring Provider: [name], MD Primary Care Physician:  [name], MD  Primary GI: Dr. Gala Romney   Chief Complaint  Patient presents with  . Follow-up    doing well     HPI:   Donald Klein is a 70 y.o. 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 In 2014 underwent exploratory laparotomy with lysis of adhesions, small bowel resection, resection of mesenteric mass, appy due to fibromatosis. Followed by [facility]. CT performed Sept 2016 at [facility] with new 3X4 cm mass in the root of the jejunal mesentery, consistent with recurrent desmoid/fibromatosis. Saw radiation oncology who was hesitant to pursue radiation due to proximity of small bowel in the field. Surgical intervention then recommended by [facility]. He underwent wedge resection of his stomach, small bowel resection, partial duodenal resection for recurrent desmoid tumor on 07/2015 at [facility]. He was seen at [facility] April 2017 for post-op follow-up and was doing well. No adjuvant treatment was needed but surveillance with CT scan through [facility] will be in 1 year (2018).    States he was told his Hgb dropped. Went to the [place] in [place]. Then he was told his PSA was elevated and going to the [place] in [place] next week. He has been referred to a GI practice in [place] with the [place]. He seems to be confused regarding GI care and states he will go to the GI specialists in [place] and see what they say, but then "I will stay with you if I need a procedure".His colonoscopy/EGD are up-to-date as of May 2016. No melena/hematochezia. States his stool is dark on iron. He has been seeing a dietician for assistance with meal planning due to history of diabetes and A1c.   Diarrhea  improved. Occasional loose stool but "not like it has been". Feels like has fibromyalgia.   Past Medical History:  Diagnosis Date  . Anxiety   . Barrett esophagus    Last EGD 05/27/08 Barrett's without dyplasia. EGD due 05/2011  . Chronic back pain   . Colitis    In 1980s, dx with UC by Dr. Humphrey Rolls at time of TCS, 1992 TCS, chronic colitis  . Depression   . Diabetes mellitus   . GERD (gastroesophageal reflux disease)   . Helicobacter pylori gastritis 2009   treated  . HTN (hypertension)   . PTSD (post-traumatic stress disorder)   . 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 Tubular adenoma of colon 10/08/10   Last colonosocpy 1 cecal TA    Past Surgical History:  Procedure Laterality Date  . APPENDECTOMY  July 2014   at time of small bowel resection  . CHOLECYSTECTOMY    . COLONOSCOPY  10/08/2010   Normal rectum/tubular adenoma/normal random biopsy. Next TCS due 10/2015.  Marland Kitchen COLONOSCOPY N/A 09/30/2014   Dr. Gala Romney: Redunant colon . Status post segmental biopsy and stool sampling. Negative stool samples and colonic biopsies.   . ESOPHAGOGASTRODUODENOSCOPY  07/31/2011   Dr. Doyce Para segment Barrett's esophagus s/p bx Hiatal hernia. Duodenal diverticulum. No dysplasia.Next EGD 07/2014.  Marland Kitchen ESOPHAGOGASTRODUODENOSCOPY N/A 09/30/2014   Dr. Gala Romney: Abnormal distal esophagus consistant with prior diagnosis of short segment Barrett's esophagus status post biopsy. Noncritical Schatizki's  ring not maipulated. Hiatal hernia. Abnormal Gastric mucosa of uncertain significance status post gastric biopsy. path with +Barrett's esophagus but no dysplasia, mild chronic gastritis. Surveillance for Barrett's due in 2019  . EXPLORATORY LAPAROTOMY W/ BOWEL RESECTION  July 2014   [facility]: with lysis of adhesions, small bowel resection X 2, resection of mesenteric mass, appendectomy, path: fibromatosis   . 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 on 07/2015     Current Outpatient Prescriptions  Medication Sig Dispense Refill  . amLODipine (NORVASC) 10 MG tablet Take 5 mg by mouth Daily. Take 1/2 daily    . Cholecalciferol (VITAMIN D) 2000 UNITS tablet Take 2,000 Units by mouth daily.    . CRESTOR 20 MG tablet Take 1 tablet by mouth every morning.     . ferrous sulfate 325 (65 FE) MG tablet Take 325 mg by mouth daily with breakfast.    . LOVAZA 1 G capsule Take 1 capsule by mouth Twice daily.    . metFORMIN (GLUCOPHAGE) 1000 MG tablet Take 1,000 mg by mouth 2 (two) times daily with a meal.      . metoprolol (LOPRESSOR) 50 MG tablet Take 75-100 tablets by mouth Twice daily. 2 tablets in am ; 1.5 tablets in pm    . nitroGLYCERIN (NITROSTAT) 0.4 MG SL tablet Place 0.4 mg under the tongue every 5 (five) minutes as needed for chest pain.    . pantoprazole (PROTONIX) 40 MG tablet Take 40 mg by mouth 2 (two) times daily before a meal.     No current facility-administered medications for this visit.     Allergies as of 12/07/2015 - Review Complete 12/07/2015  Allergen Reaction Noted  . Lovenox [enoxaparin sodium] Hives and Rash 09/30/2014  . Penicillins Rash     Family History  Problem Relation Age of Onset  . Heart attack Father     32  . Colon cancer Neg Hx     Social History   Social History  . Marital status: Married    Spouse name: N/A  . Number of children: 1  . Years of education: N/A   Occupational History  . disability    Social History Main Topics  . Smoking status: Never Smoker  . Smokeless tobacco: Never Used  . Alcohol use No  . Drug use: No  . Sexual activity: Yes    Partners: Female    Birth control/ protection: None     Comment: spouse   Other Topics Concern  . None   Social History Narrative  .  None    Review of Systems: As mentioned in HPI   Physical Exam: BP 135/74   Pulse 64   Temp 97.3 F (36.3 C) (Oral)   Ht 5\' 9"  (1.753 m)   Wt 163 lb 6.4 oz (74.1 kg)   BMI 24.13 kg/m  General:   Alert and oriented. No distress noted. Pleasant and cooperative.  Head:  Normocephalic and atraumatic. Abdomen:  +BS, soft, non-tender and non-distended. No rebound or guarding. Well-healed midline surgical incision.  Extremities:  Without edema. Neurologic:  Alert and  oriented x4;  grossly normal neurologically. Psych:  Alert and cooperative. Normal mood and affect.

## 2016-02-23 DIAGNOSIS — E7800 Pure hypercholesterolemia, unspecified: Secondary | ICD-10-CM | POA: Diagnosis not present

## 2016-02-23 DIAGNOSIS — E118 Type 2 diabetes mellitus with unspecified complications: Secondary | ICD-10-CM | POA: Diagnosis not present

## 2016-02-23 DIAGNOSIS — R972 Elevated prostate specific antigen [PSA]: Secondary | ICD-10-CM | POA: Diagnosis not present

## 2016-02-23 DIAGNOSIS — I10 Essential (primary) hypertension: Secondary | ICD-10-CM | POA: Diagnosis not present

## 2016-03-13 DIAGNOSIS — I10 Essential (primary) hypertension: Secondary | ICD-10-CM | POA: Diagnosis not present

## 2016-03-13 DIAGNOSIS — E118 Type 2 diabetes mellitus with unspecified complications: Secondary | ICD-10-CM | POA: Diagnosis not present

## 2016-03-19 DIAGNOSIS — E7800 Pure hypercholesterolemia, unspecified: Secondary | ICD-10-CM | POA: Diagnosis not present

## 2016-03-19 DIAGNOSIS — I10 Essential (primary) hypertension: Secondary | ICD-10-CM | POA: Diagnosis not present

## 2016-03-19 DIAGNOSIS — E1140 Type 2 diabetes mellitus with diabetic neuropathy, unspecified: Secondary | ICD-10-CM | POA: Diagnosis not present

## 2016-03-19 DIAGNOSIS — E118 Type 2 diabetes mellitus with unspecified complications: Secondary | ICD-10-CM | POA: Diagnosis not present

## 2016-03-19 DIAGNOSIS — M797 Fibromyalgia: Secondary | ICD-10-CM | POA: Diagnosis not present

## 2016-04-16 DIAGNOSIS — E118 Type 2 diabetes mellitus with unspecified complications: Secondary | ICD-10-CM | POA: Diagnosis not present

## 2016-04-16 DIAGNOSIS — H9313 Tinnitus, bilateral: Secondary | ICD-10-CM | POA: Diagnosis not present

## 2016-04-16 DIAGNOSIS — M797 Fibromyalgia: Secondary | ICD-10-CM | POA: Diagnosis not present

## 2016-04-16 DIAGNOSIS — E1140 Type 2 diabetes mellitus with diabetic neuropathy, unspecified: Secondary | ICD-10-CM | POA: Diagnosis not present

## 2016-04-16 DIAGNOSIS — I2510 Atherosclerotic heart disease of native coronary artery without angina pectoris: Secondary | ICD-10-CM | POA: Diagnosis not present

## 2016-04-16 DIAGNOSIS — H9190 Unspecified hearing loss, unspecified ear: Secondary | ICD-10-CM | POA: Diagnosis not present

## 2016-04-16 DIAGNOSIS — G4733 Obstructive sleep apnea (adult) (pediatric): Secondary | ICD-10-CM | POA: Diagnosis not present

## 2016-04-16 DIAGNOSIS — F4312 Post-traumatic stress disorder, chronic: Secondary | ICD-10-CM | POA: Diagnosis not present

## 2016-05-21 NOTE — Progress Notes (Signed)
Hgb in July 2017 was 12.6. Ferritin low at 17. Iron 66.   Can we find out if patient went to the GI practice in [place] and what they did? Last TCS/EGD in May 2016. 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 Need to find out if they did any procedures.

## 2016-05-27 NOTE — Progress Notes (Signed)
Records from [place] are in AB box

## 2016-06-04 ENCOUNTER — Encounter: Payer: Self-pay | Admitting: Internal Medicine

## 2016-06-25 DIAGNOSIS — E118 Type 2 diabetes mellitus with unspecified complications: Secondary | ICD-10-CM | POA: Diagnosis not present

## 2016-06-25 DIAGNOSIS — Z125 Encounter for screening for malignant neoplasm of prostate: Secondary | ICD-10-CM | POA: Diagnosis not present

## 2016-06-25 DIAGNOSIS — K7689 Other specified diseases of liver: Secondary | ICD-10-CM | POA: Diagnosis not present

## 2016-06-25 DIAGNOSIS — M797 Fibromyalgia: Secondary | ICD-10-CM | POA: Diagnosis not present

## 2016-06-25 DIAGNOSIS — K431 Incisional hernia with gangrene: Secondary | ICD-10-CM | POA: Diagnosis not present

## 2016-06-25 DIAGNOSIS — I10 Essential (primary) hypertension: Secondary | ICD-10-CM | POA: Diagnosis not present

## 2016-06-25 DIAGNOSIS — E1140 Type 2 diabetes mellitus with diabetic neuropathy, unspecified: Secondary | ICD-10-CM | POA: Diagnosis not present

## 2016-07-30 DIAGNOSIS — I10 Essential (primary) hypertension: Secondary | ICD-10-CM | POA: Diagnosis not present

## 2016-07-30 DIAGNOSIS — E118 Type 2 diabetes mellitus with unspecified complications: Secondary | ICD-10-CM | POA: Diagnosis not present

## 2016-07-30 DIAGNOSIS — E1140 Type 2 diabetes mellitus with diabetic neuropathy, unspecified: Secondary | ICD-10-CM | POA: Diagnosis not present

## 2016-07-30 DIAGNOSIS — R972 Elevated prostate specific antigen [PSA]: Secondary | ICD-10-CM | POA: Diagnosis not present

## 2016-07-30 DIAGNOSIS — E7800 Pure hypercholesterolemia, unspecified: Secondary | ICD-10-CM | POA: Diagnosis not present

## 2016-08-21 DIAGNOSIS — C49A4 Gastrointestinal stromal tumor of large intestine: Secondary | ICD-10-CM | POA: Diagnosis not present

## 2016-08-21 DIAGNOSIS — E7800 Pure hypercholesterolemia, unspecified: Secondary | ICD-10-CM | POA: Diagnosis not present

## 2016-08-21 DIAGNOSIS — K219 Gastro-esophageal reflux disease without esophagitis: Secondary | ICD-10-CM | POA: Diagnosis not present

## 2016-08-21 DIAGNOSIS — Z9889 Other specified postprocedural states: Secondary | ICD-10-CM | POA: Diagnosis not present

## 2016-08-21 DIAGNOSIS — I10 Essential (primary) hypertension: Secondary | ICD-10-CM | POA: Diagnosis not present

## 2016-08-21 DIAGNOSIS — C49A2 Gastrointestinal stromal tumor of stomach: Secondary | ICD-10-CM | POA: Diagnosis not present

## 2016-08-21 DIAGNOSIS — Z7982 Long term (current) use of aspirin: Secondary | ICD-10-CM | POA: Diagnosis not present

## 2016-08-21 DIAGNOSIS — M729 Fibroblastic disorder, unspecified: Secondary | ICD-10-CM | POA: Diagnosis not present

## 2016-08-21 DIAGNOSIS — Z9049 Acquired absence of other specified parts of digestive tract: Secondary | ICD-10-CM | POA: Diagnosis not present

## 2016-08-21 DIAGNOSIS — E119 Type 2 diabetes mellitus without complications: Secondary | ICD-10-CM | POA: Diagnosis not present

## 2016-09-10 DIAGNOSIS — E1140 Type 2 diabetes mellitus with diabetic neuropathy, unspecified: Secondary | ICD-10-CM | POA: Diagnosis not present

## 2016-09-10 DIAGNOSIS — R972 Elevated prostate specific antigen [PSA]: Secondary | ICD-10-CM | POA: Diagnosis not present

## 2016-09-10 DIAGNOSIS — N289 Disorder of kidney and ureter, unspecified: Secondary | ICD-10-CM | POA: Diagnosis not present

## 2016-09-10 DIAGNOSIS — E118 Type 2 diabetes mellitus with unspecified complications: Secondary | ICD-10-CM | POA: Diagnosis not present

## 2016-09-29 DIAGNOSIS — D649 Anemia, unspecified: Secondary | ICD-10-CM | POA: Diagnosis not present

## 2016-09-29 DIAGNOSIS — R110 Nausea: Secondary | ICD-10-CM | POA: Diagnosis not present

## 2016-09-29 DIAGNOSIS — R197 Diarrhea, unspecified: Secondary | ICD-10-CM | POA: Diagnosis not present

## 2016-09-29 DIAGNOSIS — E860 Dehydration: Secondary | ICD-10-CM | POA: Diagnosis not present

## 2016-09-29 DIAGNOSIS — R55 Syncope and collapse: Secondary | ICD-10-CM | POA: Diagnosis not present

## 2016-09-29 DIAGNOSIS — R079 Chest pain, unspecified: Secondary | ICD-10-CM | POA: Diagnosis not present

## 2016-09-29 DIAGNOSIS — E340 Carcinoid syndrome: Secondary | ICD-10-CM | POA: Diagnosis not present

## 2016-09-29 DIAGNOSIS — R404 Transient alteration of awareness: Secondary | ICD-10-CM | POA: Diagnosis not present

## 2016-09-29 DIAGNOSIS — E0865 Diabetes mellitus due to underlying condition with hyperglycemia: Secondary | ICD-10-CM | POA: Diagnosis not present

## 2016-09-29 DIAGNOSIS — I10 Essential (primary) hypertension: Secondary | ICD-10-CM | POA: Diagnosis not present

## 2016-09-29 DIAGNOSIS — I959 Hypotension, unspecified: Secondary | ICD-10-CM | POA: Diagnosis not present

## 2016-09-29 DIAGNOSIS — R740 Nonspecific elevation of levels of transaminase and lactic acid dehydrogenase [LDH]: Secondary | ICD-10-CM | POA: Diagnosis not present

## 2016-09-29 DIAGNOSIS — K589 Irritable bowel syndrome without diarrhea: Secondary | ICD-10-CM | POA: Diagnosis not present

## 2016-11-02 ENCOUNTER — Encounter (HOSPITAL_COMMUNITY): Payer: Self-pay | Admitting: *Deleted

## 2016-11-02 ENCOUNTER — Emergency Department (HOSPITAL_COMMUNITY)
Admission: EM | Admit: 2016-11-02 | Discharge: 2016-11-03 | Disposition: A | Discharge status: Home / Self Care | Payer: Medicare PPO | Attending: Emergency Medicine | Admitting: Emergency Medicine

## 2016-11-02 ENCOUNTER — Emergency Department (HOSPITAL_COMMUNITY): Payer: Medicare PPO

## 2016-11-02 DIAGNOSIS — E119 Type 2 diabetes mellitus without complications: Secondary | ICD-10-CM | POA: Insufficient documentation

## 2016-11-02 DIAGNOSIS — Z79899 Other long term (current) drug therapy: Secondary | ICD-10-CM | POA: Insufficient documentation

## 2016-11-02 DIAGNOSIS — I499 Cardiac arrhythmia, unspecified: Secondary | ICD-10-CM | POA: Diagnosis not present

## 2016-11-02 DIAGNOSIS — Z7984 Long term (current) use of oral hypoglycemic drugs: Secondary | ICD-10-CM | POA: Diagnosis not present

## 2016-11-02 DIAGNOSIS — R002 Palpitations: Secondary | ICD-10-CM | POA: Diagnosis not present

## 2016-11-02 DIAGNOSIS — I10 Essential (primary) hypertension: Secondary | ICD-10-CM | POA: Insufficient documentation

## 2016-11-02 NOTE — ED Triage Notes (Signed)
Pt c/o irregular heartbeat at night for "a long time":worse over the past few nights,

## 2016-11-03 DIAGNOSIS — I499 Cardiac arrhythmia, unspecified: Secondary | ICD-10-CM | POA: Diagnosis not present

## 2016-11-03 DIAGNOSIS — R002 Palpitations: Secondary | ICD-10-CM | POA: Diagnosis not present

## 2016-11-03 LAB — CBC
HCT: 35.5 % — ABNORMAL LOW (ref 39.0–52.0)
Hemoglobin: 12.2 g/dL — ABNORMAL LOW (ref 13.0–17.0)
MCH: 28.5 pg (ref 26.0–34.0)
MCHC: 34.4 g/dL (ref 30.0–36.0)
MCV: 82.9 fL (ref 78.0–100.0)
Platelets: 213 10*3/uL (ref 150–400)
RBC: 4.28 MIL/uL (ref 4.22–5.81)
RDW: 14.3 % (ref 11.5–15.5)
WBC: 4.9 10*3/uL (ref 4.0–10.5)

## 2016-11-03 LAB — COMPREHENSIVE METABOLIC PANEL
ALT: 47 U/L (ref 17–63)
AST: 44 U/L — ABNORMAL HIGH (ref 15–41)
Albumin: 4 g/dL (ref 3.5–5.0)
Alkaline Phosphatase: 57 U/L (ref 38–126)
Anion gap: 10 (ref 5–15)
BUN: 13 mg/dL (ref 6–20)
CO2: 24 mmol/L (ref 22–32)
Calcium: 9.4 mg/dL (ref 8.9–10.3)
Chloride: 106 mmol/L (ref 101–111)
Creatinine, Ser: 1.15 mg/dL (ref 0.61–1.24)
GFR calc Af Amer: >60 mL/min (ref >60)
GFR calc non Af Amer: >60 mL/min (ref >60)
Glucose, Bld: 151 mg/dL — ABNORMAL HIGH (ref 65–99)
Potassium: 3.7 mmol/L (ref 3.5–5.1)
Sodium: 140 mmol/L (ref 135–145)
Total Bilirubin: 0.5 mg/dL (ref 0.3–1.2)
Total Protein: 7.2 g/dL (ref 6.5–8.1)

## 2016-11-03 LAB — MAGNESIUM: Magnesium: 1.3 mg/dL — ABNORMAL LOW (ref 1.7–2.4)

## 2016-11-03 LAB — TROPONIN I: Troponin I: <0.03 ng/mL (ref <0.03)

## 2016-11-03 MED ORDER — MAGNESIUM SULFATE 50 % IJ SOLN
2.0000 g | Freq: Once | INTRAMUSCULAR | Status: AC
  Administered 2016-11-03: 2 g via INTRAMUSCULAR
  Filled 2016-11-03: qty 4

## 2016-11-03 NOTE — ED Provider Notes (Signed)
[facility] DEPT Provider Note   CSN: [id number] Arrival date & time: 11/02/16  2258  Time seen 23:55 PM    History   Chief Complaint Chief Complaint  Patient presents with  . Irregular Heart Beat    HPI Donald Klein is a 71 y.o. male.  HPI  patient states he has a feeling of having a irregular heartbeat off and on for "a long period of time". And states that means years. He states he had a cardiac cath about 20 years ago when he was having some shoulder pain. He states that was normal. He states about a year ago he was drinking a lot of iced coffee and went to a [facility] hospital where he was noted to have a low potassium. That was treated and he stopped drinking caffeine. He states the past 2 nights he feels like his heart is beating irregularly when he lays down. He states last night it lasted all night long. He states he feels like his heart is skipping, not racing. He states sometimes it makes him cough and it makes him feel short of breath at times. He denies chest pain or diaphoresis. He states tonight he went to bed about 9 and he woke up and states he felt like his heart was skipping until he came to the emergency department about 11 PM. He states it has also been skipping while he has been in the hospital stretcher on the monitor.  Pt states he was at the [facility] last week and they couldn't get blood from him for testing and it took 6 tries.   PCP Dr Maudie Mercury  Past Medical History:  Diagnosis Date  . Anxiety   . Barrett esophagus    Last EGD 05/27/08 Barrett's without dyplasia. EGD due 05/2011  . Chronic back pain   . Colitis    In 1980s, dx with UC by Dr. Humphrey Rolls at time of TCS, 1992 TCS, chronic colitis  . Depression   . Diabetes mellitus   . GERD (gastroesophageal reflux disease)   . Helicobacter pylori gastritis 2009   treated  . HTN (hypertension)   . PTSD (post-traumatic stress disorder)   . 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 Tubular adenoma of colon 10/08/10   Last colonosocpy 1 cecal TA    Patient Active Problem List   Diagnosis Date Noted  . Pancreatic lesion 06/07/2015  . Mucosal abnormality of stomach   . Abdominal pain, epigastric 09/29/2012  . 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 Barrett's esophagus 07/16/2011  . Tubular adenoma of colon 10/08/2010  . Diarrhea 08/21/2010  . Colitis 08/21/2010  . Barrett esophagus 08/21/2010  . DM 08/16/2008  . ANXIETY 08/16/2008  . POSTTRAUMATIC STRESS DISORDER 08/16/2008  . DEPRESSION 08/16/2008  . HYPERTENSION 08/16/2008  . GASTROESOPHAGEAL REFLUX DISEASE, CHRONIC 08/16/2008  . BACK PAIN, CHRONIC 08/16/2008  . Personal history of other diseases of digestive system 08/16/2008    Past Surgical History:  Procedure Laterality Date  . APPENDECTOMY  July 2014   at time of small bowel resection  . CHOLECYSTECTOMY    . COLONOSCOPY  10/08/2010   Normal rectum/tubular adenoma/normal random biopsy. Next TCS due 10/2015.  Marland Kitchen COLONOSCOPY N/A 09/30/2014   Dr. Gala Romney: Redunant colon . Status post segmental biopsy and stool sampling. Negative stool samples and colonic biopsies.   . ESOPHAGOGASTRODUODENOSCOPY  07/31/2011   Dr. Doyce Para segment Barrett's esophagus  s/p bx Hiatal hernia. Duodenal diverticulum. No dysplasia.Next EGD 07/2014.  Marland Kitchen ESOPHAGOGASTRODUODENOSCOPY N/A 09/30/2014   Dr. Gala Romney: Abnormal distal esophagus consistant with prior diagnosis of short segment Barrett's esophagus status post biopsy. 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 Hiatal hernia. Abnormal Gastric mucosa of uncertain significance status post gastric biopsy. path with +Barrett's esophagus but no dysplasia, mild chronic gastritis. Surveillance for Barrett's due in 2019  . EXPLORATORY LAPAROTOMY W/ BOWEL RESECTION  July 2014   [facility]: with lysis of adhesions, small bowel resection X 2, resection of mesenteric mass, appendectomy, path: fibromatosis   .  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 Authorizing Provider  amLODipine (NORVASC) 10 MG tablet Take 5 mg by mouth Daily. Take 1/2 daily 11/25/10   [provider]  Cholecalciferol (VITAMIN D) 2000 UNITS tablet Take 2,000 Units by mouth daily.    [provider]  CRESTOR 20 MG tablet Take 1 tablet by mouth every morning.  08/05/10   [provider]  ferrous sulfate 325 (65 FE) MG tablet Take 325 mg by mouth daily with breakfast.    [provider]  LOVAZA 1 G capsule Take 1 capsule by mouth Twice daily. 08/05/10   [provider]  metFORMIN (GLUCOPHAGE) 1000 MG tablet Take 1,000 mg by mouth 2 (two) times daily with a meal.      [provider]  metoprolol (LOPRESSOR) 50 MG tablet Take 75-100 tablets by mouth Twice daily. 2 tablets in am ; 1.5 tablets in pm 07/31/10   [provider]  nitroGLYCERIN (NITROSTAT) 0.4 MG SL tablet Place 0.4 mg under the tongue every 5 (five) minutes as needed for chest pain.    [provider]  pantoprazole (PROTONIX) 40 MG tablet Take 40 mg by mouth 2 (two) times daily before a meal.    [provider]    Family History Family History  Problem Relation Age of Onset  . Heart attack Father        93  . Colon cancer Neg Hx     Social History Social History  Substance Use Topics  . Smoking status: Never Smoker  . Smokeless tobacco: Never Used  . Alcohol use No  lives at home Lives with spouse   Allergies   Lovenox [enoxaparin sodium] and Penicillins   Review of Systems Review of Systems  All other systems reviewed and are negative.    Physical  Exam Updated Vital Signs BP (!) 180/91   Pulse 70   Temp 98.2 F (36.8 C) (Oral)   Resp 16   Ht 5\' 9"  (1.753 m)   Wt 71.7 kg (158 lb)   SpO2 100%   BMI 23.33 kg/m   Vital signs normal except for hypertension   Physical Exam  Constitutional: He is oriented to person, place, and time. He appears well-developed and well-nourished.  Non-toxic appearance. He does not appear ill. No distress.  HENT:  Head: Normocephalic and atraumatic.  Right Ear: External ear normal.  Left Ear: External ear normal.  Nose: Nose normal. No mucosal edema or rhinorrhea.  Mouth/Throat: Oropharynx is clear and moist and mucous membranes are normal. No dental abscesses or  uvula swelling.  Eyes: Conjunctivae and EOM are normal. Pupils are equal, round, and reactive to light.  Neck: Normal range of motion and full passive range of motion without pain. Neck supple.  Cardiovascular: Normal rate, regular rhythm and normal heart sounds.  Exam reveals no gallop and no friction rub.   No murmur heard. Pulmonary/Chest: Effort normal and breath sounds normal. No respiratory distress. He has no wheezes. He has no rhonchi. He has no rales. He exhibits no tenderness and no crepitus.  Abdominal: Soft. Normal appearance and bowel sounds are normal. He exhibits no distension. There is no tenderness. There is no rebound and no guarding.  Musculoskeletal: Normal range of motion. He exhibits no edema or tenderness.  Moves all extremities well.   Neurological: He is alert and oriented to person, place, and time. He has normal strength. No cranial nerve deficit.  Skin: Skin is warm, dry and intact. No rash noted. No erythema. No pallor.  Psychiatric: He has a normal mood and affect. His speech is normal and behavior is normal. His mood appears not anxious.  Nursing note and vitals reviewed.    ED Treatments / Results  Labs (all labs ordered are listed, but only abnormal results are displayed) Results for orders placed or  performed during the hospital encounter of 11/02/16  CBC  Result Value Ref Range   WBC 4.9 4.0 - 10.5 K/uL   RBC 4.28 4.22 - 5.81 MIL/uL   Hemoglobin 12.2 (L) 13.0 - 17.0 g/dL   HCT 35.5 (L) 39.0 - 52.0 %   MCV 82.9 78.0 - 100.0 fL   MCH 28.5 26.0 - 34.0 pg   MCHC 34.4 30.0 - 36.0 g/dL   RDW 14.3 11.5 - 15.5 %   Platelets 213 150 - 400 K/uL  Troponin I  Result Value Ref Range   Troponin I <0.03 <0.03 ng/mL  Comprehensive metabolic panel  Result Value Ref Range   Sodium 140 135 - 145 mmol/L   Potassium 3.7 3.5 - 5.1 mmol/L   Chloride 106 101 - 111 mmol/L   CO2 24 22 - 32 mmol/L   Glucose, Bld 151 (H) 65 - 99 mg/dL   BUN 13 6 - 20 mg/dL   Creatinine, Ser 1.15 0.61 - 1.24 mg/dL   Calcium 9.4 8.9 - 10.3 mg/dL   Total Protein 7.2 6.5 - 8.1 g/dL   Albumin 4.0 3.5 - 5.0 g/dL   AST 44 (H) 15 - 41 U/L   ALT 47 17 - 63 U/L   Alkaline Phosphatase 57 38 - 126 U/L   Total Bilirubin 0.5 0.3 - 1.2 mg/dL   GFR calc non Af Amer >60 >60 mL/min   GFR calc Af Amer >60 >60 mL/min   Anion gap 10 5 - 15  Magnesium  Result Value Ref Range   Magnesium 1.3 (L) 1.7 - 2.4 mg/dL   Laboratory interpretation all normal except hypomagnesemia     EKG  EKG Interpretation  Date/Time:  Saturday November 02 2016 23:10:02 EDT Ventricular Rate:  67 PR Interval:  146 QRS Duration: 76 QT Interval:  398 QTC Calculation: 420 R Axis:     Text Interpretation:  Normal sinus rhythm Possible Inferior infarct , age undetermined No significant change since last tracing 13 Jun 2015 Confirmed by Rolland Porter [phone]) on 11/02/2016 11:23:56 PM       Radiology Dg Chest 2 View  Result Date: 11/03/2016 CLINICAL DATA:  71 y/o  M; irregular heartbeat. EXAM: CHEST  2 VIEW  COMPARISON:  06/13/2015 chest radiograph FINDINGS: Stable heart size and mediastinal contours are within normal limits. Both lungs are clear. Stable mild multilevel degenerative changes of the thoracic spine. Right upper quadrant surgical clips,  presumably cholecystectomy. IMPRESSION: No acute pulmonary process.  Stable normal heart size. Electronically Signed   By: Kristine Garbe M.D.   On: 11/03/2016 01:15    Procedures Procedures (including critical care time)  Medications Ordered in ED Medications  magnesium sulfate (IV Push/IM) injection 2 g (2 g Intramuscular Given 11/03/16 0257)     Initial Impression / Assessment and Plan / ED Course  I have reviewed the triage vital signs and the nursing notes.  Pertinent labs & imaging results that were available during my care of the patient were reviewed by me and considered in my medical decision making (see chart for details).   I had checked patient's monitor prior to going into his room and there was no ectopy seen.   Recheck at 1:45 AM we discussed his test results which were significant for a low magnesium. Patient has elected to get magnesium IM. That was ordered. Patient can follow-up with his PCP to make sure his magnesium level remains normal. On review of his monitor there were no irregularities or alarms during his ED visit.  Final Clinical Impressions(s) / ED Diagnoses   Final diagnoses:  Palpitations  Hypomagnesemia    Plan discharge  Rolland Porter, MD, Barbette Or, MD 11/03/16 [phone]

## 2016-11-03 NOTE — Discharge Instructions (Signed)
Follow up with your doctor about your low magnesium, usually the injections bring your level back up to normal. Consider seeing a cardiologist if you continue to have palpitations. Return to the ED if you get chest pain, feel dizzy or lightheaded, get short of breath.

## 2016-11-08 DIAGNOSIS — R002 Palpitations: Secondary | ICD-10-CM | POA: Diagnosis not present

## 2016-11-08 DIAGNOSIS — R05 Cough: Secondary | ICD-10-CM | POA: Diagnosis not present

## 2016-11-08 DIAGNOSIS — I10 Essential (primary) hypertension: Secondary | ICD-10-CM | POA: Diagnosis not present

## 2016-11-18 DIAGNOSIS — R05 Cough: Secondary | ICD-10-CM | POA: Diagnosis not present

## 2016-11-18 DIAGNOSIS — I10 Essential (primary) hypertension: Secondary | ICD-10-CM | POA: Diagnosis not present

## 2016-11-18 DIAGNOSIS — E1140 Type 2 diabetes mellitus with diabetic neuropathy, unspecified: Secondary | ICD-10-CM | POA: Diagnosis not present

## 2016-11-18 DIAGNOSIS — J309 Allergic rhinitis, unspecified: Secondary | ICD-10-CM | POA: Diagnosis not present

## 2016-11-18 DIAGNOSIS — H6121 Impacted cerumen, right ear: Secondary | ICD-10-CM | POA: Diagnosis not present

## 2016-11-19 DIAGNOSIS — E118 Type 2 diabetes mellitus with unspecified complications: Secondary | ICD-10-CM | POA: Diagnosis not present

## 2016-11-19 DIAGNOSIS — I10 Essential (primary) hypertension: Secondary | ICD-10-CM | POA: Diagnosis not present

## 2016-11-25 DIAGNOSIS — R972 Elevated prostate specific antigen [PSA]: Secondary | ICD-10-CM | POA: Diagnosis not present

## 2016-11-25 DIAGNOSIS — N5201 Erectile dysfunction due to arterial insufficiency: Secondary | ICD-10-CM | POA: Diagnosis not present

## 2016-12-02 ENCOUNTER — Ambulatory Visit (HOSPITAL_COMMUNITY)
Admission: RE | Admit: 2016-12-02 | Discharge: 2016-12-02 | Disposition: A | Discharge status: Home / Self Care | Payer: Medicare PPO | Source: Ambulatory Visit | Attending: Internal Medicine | Admitting: Internal Medicine

## 2016-12-02 ENCOUNTER — Other Ambulatory Visit (HOSPITAL_COMMUNITY): Payer: Self-pay | Admitting: Internal Medicine

## 2016-12-02 DIAGNOSIS — R059 Cough, unspecified: Secondary | ICD-10-CM

## 2016-12-02 DIAGNOSIS — R05 Cough: Secondary | ICD-10-CM | POA: Insufficient documentation

## 2016-12-02 DIAGNOSIS — I10 Essential (primary) hypertension: Secondary | ICD-10-CM | POA: Diagnosis not present

## 2016-12-02 DIAGNOSIS — E1140 Type 2 diabetes mellitus with diabetic neuropathy, unspecified: Secondary | ICD-10-CM | POA: Diagnosis not present

## 2016-12-02 DIAGNOSIS — J309 Allergic rhinitis, unspecified: Secondary | ICD-10-CM | POA: Diagnosis not present

## 2016-12-02 DIAGNOSIS — E7800 Pure hypercholesterolemia, unspecified: Secondary | ICD-10-CM | POA: Diagnosis not present

## 2017-02-28 DIAGNOSIS — E7800 Pure hypercholesterolemia, unspecified: Secondary | ICD-10-CM | POA: Diagnosis not present

## 2017-02-28 DIAGNOSIS — E1140 Type 2 diabetes mellitus with diabetic neuropathy, unspecified: Secondary | ICD-10-CM | POA: Diagnosis not present

## 2017-03-04 DIAGNOSIS — I10 Essential (primary) hypertension: Secondary | ICD-10-CM | POA: Diagnosis not present

## 2017-03-04 DIAGNOSIS — E118 Type 2 diabetes mellitus with unspecified complications: Secondary | ICD-10-CM | POA: Diagnosis not present

## 2017-03-04 DIAGNOSIS — E7800 Pure hypercholesterolemia, unspecified: Secondary | ICD-10-CM | POA: Diagnosis not present

## 2017-05-03 DIAGNOSIS — I10 Essential (primary) hypertension: Secondary | ICD-10-CM | POA: Diagnosis not present

## 2017-05-03 DIAGNOSIS — E118 Type 2 diabetes mellitus with unspecified complications: Secondary | ICD-10-CM | POA: Diagnosis not present

## 2017-05-13 DIAGNOSIS — E118 Type 2 diabetes mellitus with unspecified complications: Secondary | ICD-10-CM | POA: Diagnosis not present

## 2017-05-13 DIAGNOSIS — E7800 Pure hypercholesterolemia, unspecified: Secondary | ICD-10-CM | POA: Diagnosis not present

## 2017-05-13 DIAGNOSIS — I10 Essential (primary) hypertension: Secondary | ICD-10-CM | POA: Diagnosis not present

## 2017-05-13 DIAGNOSIS — E1140 Type 2 diabetes mellitus with diabetic neuropathy, unspecified: Secondary | ICD-10-CM | POA: Diagnosis not present

## 2017-08-08 DIAGNOSIS — I10 Essential (primary) hypertension: Secondary | ICD-10-CM | POA: Diagnosis not present

## 2017-08-08 DIAGNOSIS — E118 Type 2 diabetes mellitus with unspecified complications: Secondary | ICD-10-CM | POA: Diagnosis not present

## 2017-08-12 DIAGNOSIS — E1140 Type 2 diabetes mellitus with diabetic neuropathy, unspecified: Secondary | ICD-10-CM | POA: Diagnosis not present

## 2017-08-12 DIAGNOSIS — I10 Essential (primary) hypertension: Secondary | ICD-10-CM | POA: Diagnosis not present

## 2017-08-12 DIAGNOSIS — E118 Type 2 diabetes mellitus with unspecified complications: Secondary | ICD-10-CM | POA: Diagnosis not present

## 2017-08-12 DIAGNOSIS — K7689 Other specified diseases of liver: Secondary | ICD-10-CM | POA: Diagnosis not present

## 2017-08-28 ENCOUNTER — Encounter: Payer: Self-pay | Admitting: Internal Medicine

## 2017-11-10 DIAGNOSIS — I10 Essential (primary) hypertension: Secondary | ICD-10-CM | POA: Diagnosis not present

## 2017-11-10 DIAGNOSIS — E119 Type 2 diabetes mellitus without complications: Secondary | ICD-10-CM | POA: Diagnosis not present

## 2017-11-11 DIAGNOSIS — M25512 Pain in left shoulder: Secondary | ICD-10-CM | POA: Diagnosis not present

## 2017-11-11 DIAGNOSIS — E7800 Pure hypercholesterolemia, unspecified: Secondary | ICD-10-CM | POA: Diagnosis not present

## 2017-11-11 DIAGNOSIS — E118 Type 2 diabetes mellitus with unspecified complications: Secondary | ICD-10-CM | POA: Diagnosis not present

## 2017-11-11 DIAGNOSIS — E1140 Type 2 diabetes mellitus with diabetic neuropathy, unspecified: Secondary | ICD-10-CM | POA: Diagnosis not present

## 2017-11-11 DIAGNOSIS — I10 Essential (primary) hypertension: Secondary | ICD-10-CM | POA: Diagnosis not present

## 2017-11-18 DIAGNOSIS — M25612 Stiffness of left shoulder, not elsewhere classified: Secondary | ICD-10-CM | POA: Diagnosis not present

## 2017-11-18 DIAGNOSIS — M7542 Impingement syndrome of left shoulder: Secondary | ICD-10-CM | POA: Diagnosis not present

## 2017-11-18 DIAGNOSIS — M25512 Pain in left shoulder: Secondary | ICD-10-CM | POA: Diagnosis not present

## 2017-11-18 DIAGNOSIS — M62512 Muscle wasting and atrophy, not elsewhere classified, left shoulder: Secondary | ICD-10-CM | POA: Diagnosis not present

## 2017-11-20 DIAGNOSIS — M25512 Pain in left shoulder: Secondary | ICD-10-CM | POA: Diagnosis not present

## 2017-11-20 DIAGNOSIS — M62512 Muscle wasting and atrophy, not elsewhere classified, left shoulder: Secondary | ICD-10-CM | POA: Diagnosis not present

## 2017-11-20 DIAGNOSIS — M7542 Impingement syndrome of left shoulder: Secondary | ICD-10-CM | POA: Diagnosis not present

## 2017-11-20 DIAGNOSIS — M25612 Stiffness of left shoulder, not elsewhere classified: Secondary | ICD-10-CM | POA: Diagnosis not present

## 2017-11-21 DIAGNOSIS — M7542 Impingement syndrome of left shoulder: Secondary | ICD-10-CM | POA: Diagnosis not present

## 2017-11-21 DIAGNOSIS — M25512 Pain in left shoulder: Secondary | ICD-10-CM | POA: Diagnosis not present

## 2017-11-21 DIAGNOSIS — M62512 Muscle wasting and atrophy, not elsewhere classified, left shoulder: Secondary | ICD-10-CM | POA: Diagnosis not present

## 2017-11-21 DIAGNOSIS — M25612 Stiffness of left shoulder, not elsewhere classified: Secondary | ICD-10-CM | POA: Diagnosis not present

## 2017-11-24 DIAGNOSIS — M62512 Muscle wasting and atrophy, not elsewhere classified, left shoulder: Secondary | ICD-10-CM | POA: Diagnosis not present

## 2017-11-24 DIAGNOSIS — M25512 Pain in left shoulder: Secondary | ICD-10-CM | POA: Diagnosis not present

## 2017-11-24 DIAGNOSIS — M25612 Stiffness of left shoulder, not elsewhere classified: Secondary | ICD-10-CM | POA: Diagnosis not present

## 2017-11-24 DIAGNOSIS — M7542 Impingement syndrome of left shoulder: Secondary | ICD-10-CM | POA: Diagnosis not present

## 2017-11-26 DIAGNOSIS — M25512 Pain in left shoulder: Secondary | ICD-10-CM | POA: Diagnosis not present

## 2017-11-26 DIAGNOSIS — M62512 Muscle wasting and atrophy, not elsewhere classified, left shoulder: Secondary | ICD-10-CM | POA: Diagnosis not present

## 2017-11-26 DIAGNOSIS — M25612 Stiffness of left shoulder, not elsewhere classified: Secondary | ICD-10-CM | POA: Diagnosis not present

## 2017-11-26 DIAGNOSIS — M7542 Impingement syndrome of left shoulder: Secondary | ICD-10-CM | POA: Diagnosis not present

## 2017-11-28 DIAGNOSIS — M25612 Stiffness of left shoulder, not elsewhere classified: Secondary | ICD-10-CM | POA: Diagnosis not present

## 2017-11-28 DIAGNOSIS — M25512 Pain in left shoulder: Secondary | ICD-10-CM | POA: Diagnosis not present

## 2017-11-28 DIAGNOSIS — M62512 Muscle wasting and atrophy, not elsewhere classified, left shoulder: Secondary | ICD-10-CM | POA: Diagnosis not present

## 2017-11-28 DIAGNOSIS — M7542 Impingement syndrome of left shoulder: Secondary | ICD-10-CM | POA: Diagnosis not present

## 2017-12-02 ENCOUNTER — Other Ambulatory Visit: Payer: Self-pay

## 2017-12-02 ENCOUNTER — Ambulatory Visit (INDEPENDENT_AMBULATORY_CARE_PROVIDER_SITE_OTHER): Payer: Medicare PPO | Admitting: Nurse Practitioner

## 2017-12-02 ENCOUNTER — Encounter: Payer: Self-pay | Admitting: Nurse Practitioner

## 2017-12-02 VITALS — BP 139/70 | HR 72 | Temp 96.8°F | Ht 69.0 in | Wt 174.0 lb

## 2017-12-02 DIAGNOSIS — M62512 Muscle wasting and atrophy, not elsewhere classified, left shoulder: Secondary | ICD-10-CM | POA: Diagnosis not present

## 2017-12-02 DIAGNOSIS — D490 Neoplasm of unspecified behavior of digestive system: Secondary | ICD-10-CM | POA: Diagnosis not present

## 2017-12-02 DIAGNOSIS — M7542 Impingement syndrome of left shoulder: Secondary | ICD-10-CM | POA: Diagnosis not present

## 2017-12-02 DIAGNOSIS — M25612 Stiffness of left shoulder, not elsewhere classified: Secondary | ICD-10-CM | POA: Diagnosis not present

## 2017-12-02 DIAGNOSIS — M25512 Pain in left shoulder: Secondary | ICD-10-CM | POA: Diagnosis not present

## 2017-12-02 DIAGNOSIS — K2270 Barrett's esophagus without dysplasia: Secondary | ICD-10-CM | POA: Diagnosis not present

## 2017-12-02 NOTE — Patient Instructions (Signed)
Nothing by mouth after 8:00am day of EGD added after instructions were printed. Pt is aware.

## 2017-12-02 NOTE — Assessment & Plan Note (Signed)
Stents of history per HPI.  He is undergoing surveillance at [facility] with repeat CT abdomen.  Status post GIST and overall his CT looks stable for the past 2 years.  Recommend he continue to follow-up with [facility].

## 2017-12-02 NOTE — Patient Instructions (Signed)
1. We will schedule your endoscopy for you. 2. Return for follow-up in 1 year. 3. Further recommendations will be made after your endoscopy. 4. Follow-up with [facility] based on their recommendations. 5. Call us if you have any questions or concerns.  At [facility] we value your feedback. You may receive a survey about your visit today. Please share your experience as we strive to create trusting relationships with our patients to provide genuine, compassionate, quality care.  It was great to meet you today!  I hope you have a great summer!!

## 2017-12-02 NOTE — Progress Notes (Signed)
Referring Provider: [name], MD Primary Care Physician:  [name], MD Primary GI:  Dr. Gala Romney  Chief Complaint  Patient presents with  . Barrett's esophagus    due for 3 yr tcs    HPI:   Donald Klein is a 72 y.o. male who presents for repeat EGD.  He was last seen in our office 12/07/2015 for Barrett's esophagus.  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 In 2014 underwent exploratory laparotomy with lysis of adhesions, small bowel resection, resection of mesenteric mass, appy due to fibromatosis. Followed by [facility]. CT performed Sept 2016 at [facility] with new 3X4 cm mass in the root of the jejunal mesentery, consistent with recurrent desmoid/fibromatosis. Saw radiation oncology who was hesitant to pursue radiation due to proximity of small bowel in the field. Surgical intervention then recommended by [facility]. He underwent wedge resection of his stomach, small bowel resection, partial duodenal resection for recurrent desmoid tumor on 07/2015 at [facility]. 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 He was seen at [facility] April 2017 for post-op follow-up and was doing well. No adjuvant treatment was needed but surveillance with CT scan through [facility] was in 2018.   CT of the abdomen and pelvis 08/21/2016 found postsurgical changes related to partial gastrectomy for gist and segmental small bowel resection for reported desmoid, stable soft tissue lesion extending from the gastric serosa into the transverse mesocolon suspicious for desmoid but stable since last postoperative CT on 08/16/2015.  Ill-defined soft tissue lesion within the central small bowel mesentery could represent a postoperative changes/scarring versus  fibromatosis.  Another CT of the abdomen and pelvis this year completed on 08/27/2017 found post surgical changes a partial gastrectomy for gist and small bowel resections for mesenteric desmoid, no significant intestinal change in the size of soft tissue lesions at the ascending and transverse mesocolon.  Today he states he's doing well overall. He states he is dealing with pulmonary nodules now at [facility]. GERD symptoms managing his GERD well. Rare breakthrough which he feels is dietary related. Denies dysphagia. Denies abdominal pain, N/V, hematochezia, fever, chills, unintentional weight loss. Has dark stools on chronic iron. Denies chest pain, dyspnea, dizziness, lightheadedness, syncope, near syncope. Denies any other upper or lower GI symptoms.  Past Medical History:  Diagnosis Date  . Anxiety   . Barrett esophagus    Last EGD 05/27/08 Barrett's without dyplasia. EGD due 05/2011  . Chronic back pain   . Colitis    In 1980s, dx with UC by Dr. Humphrey Rolls at time of TCS, 1992 TCS, chronic colitis  . Depression   . Diabetes mellitus   . GERD (gastroesophageal reflux disease)   . Helicobacter pylori gastritis 2009   treated  . HTN (hypertension)   . PTSD (post-traumatic stress disorder)   . 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 Tubular adenoma of colon 10/08/10   Last colonosocpy 1 cecal TA    Past Surgical History:  Procedure Laterality Date  . APPENDECTOMY  July 2014   at time of small bowel resection  . CHOLECYSTECTOMY    . COLONOSCOPY  10/08/2010   Normal rectum/tubular adenoma/normal random biopsy. Next TCS due 10/2015.  Marland Kitchen  COLONOSCOPY N/A 09/30/2014   Dr. Gala Romney: Redunant colon . Status post segmental biopsy and stool sampling. Negative stool samples and colonic biopsies.   . ESOPHAGOGASTRODUODENOSCOPY  07/31/2011   Dr. Doyce Para segment Barrett's esophagus s/p bx Hiatal hernia. Duodenal diverticulum. No dysplasia.Next  EGD 07/2014.  Marland Kitchen ESOPHAGOGASTRODUODENOSCOPY N/A 09/30/2014   Dr. Gala Romney: Abnormal distal esophagus consistant with prior diagnosis of short segment Barrett's esophagus status post biopsy. 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 Hiatal hernia. Abnormal Gastric mucosa of uncertain significance status post gastric biopsy. path with +Barrett's esophagus but no dysplasia, mild chronic gastritis. Surveillance for Barrett's due in 2019  . EXPLORATORY LAPAROTOMY W/ BOWEL RESECTION  July 2014   [facility]: with lysis of adhesions, small bowel resection X 2, resection of mesenteric mass, appendectomy, path: fibromatosis   . 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 amLODipine (NORVASC) 10 MG tablet Take 5 mg by mouth Daily. Take 1/2 daily    . aspirin EC 81 MG tablet Take 81 mg by mouth daily.    . Cholecalciferol (VITAMIN D) 2000 UNITS tablet Take 2,000 Units by mouth daily.    . CRESTOR 20 MG tablet Take 1 tablet by mouth every morning.     . ferrous sulfate 325 (65 FE) MG tablet Take 325 mg by mouth daily with breakfast.    . gabapentin (NEURONTIN) 400 MG capsule Take 1 capsule by mouth daily.    Marland Kitchen glipiZIDE (GLUCOTROL) 5 MG tablet Take by mouth 2 (two) times daily before a meal.    . JANUMET 50-1000 MG tablet TK 1 T PO BID FOR DIABETES  12  . losartan (COZAAR) 100 MG tablet Take 100 mg by mouth daily.    Marland Kitchen LOVAZA 1 G capsule Take 1 capsule by mouth Twice daily.    . magnesium oxide (MAG-OX) 400 MG tablet Take 400 mg by mouth daily.    . metoprolol (LOPRESSOR) 50 MG tablet Take 75-100 tablets by mouth Twice daily. 2 tablets in am ; 1.5 tablets in pm    . nitroGLYCERIN (NITROSTAT) 0.4 MG SL tablet Place 0.4 mg under  the tongue every 5 (five) minutes as needed for chest pain.    Marland Kitchen omeprazole (PRILOSEC) 20 MG capsule Take 20 mg by mouth 2 (two) times daily before a meal.    . vitamin C (ASCORBIC ACID) 250 MG tablet Take 250 mg by mouth daily.    . metFORMIN (GLUCOPHAGE) 1000 MG tablet Take 1,000 mg by mouth 2 (two) times daily with a meal.       No current facility-administered medications for this visit.     Allergies as of 12/02/2017 - Review Complete 12/02/2017  Allergen Reaction Noted  . Lovenox [enoxaparin sodium] Hives and Rash 09/30/2014  . Penicillins Rash     Family History  Problem Relation Age of Onset  . Heart attack Father        61  . Colon cancer Neg Hx     Social History   Socioeconomic History  . Marital status: Married    Spouse name: Not on file  . Number of children: 1  . Years of education: Not on file  . Highest education level: Not on file  Occupational History  .  Occupation: disability  Social Needs  . Financial resource strain: Not on file  . Food insecurity:    Worry: Not on file    Inability: Not on file  . Transportation needs:    Medical: Not on file    Non-medical: Not on file  Tobacco Use  . Smoking status: Never Smoker  . Smokeless tobacco: Never Used  Substance and Sexual Activity  . Alcohol use: No    Alcohol/week: 0.0 oz  . Drug use: No  . Sexual activity: Yes    Partners: Female    Birth control/protection: None    Comment: spouse  Lifestyle  . Physical activity:    Days per week: Not on file    Minutes per session: Not on file  . Stress: Not on file  Relationships  . Social connections:    Talks on phone: Not on file    Gets together: Not on file    Attends religious service: Not on file    Active member of club or organization: Not on file    Attends meetings of clubs or organizations: Not on file    Relationship status: Not on file  Other Topics Concern  . Not on file  Social History Narrative  . Not on file    Review of  Systems: General: Negative for anorexia, weight loss, fever, chills, fatigue, weakness. ENT: Negative for hoarseness, difficulty swallowing , nasal congestion. CV: Negative for chest pain, angina, palpitations, dyspnea on exertion, peripheral edema.  Respiratory: Negative for dyspnea at rest, dyspnea on exertion, cough, sputum, wheezing.  GI: See history of present illness. Endo: Negative for unusual weight change.  Heme: Negative for bruising or bleeding.   Physical Exam: BP 139/70   Pulse 72   Temp (!) 96.8 F (36 C) (Oral)   Ht 5\' 9"  (1.753 m)   Wt 174 lb (78.9 kg)   BMI 25.70 kg/m  General:   Alert and oriented. Pleasant and cooperative. Well-nourished and well-developed.  Eyes:  Without icterus, sclera clear and conjunctiva pink.  Ears:  Normal auditory acuity. Cardiovascular:  S1, S2 present without murmurs appreciated. Extremities without clubbing or edema. Respiratory:  Clear to auscultation bilaterally. No wheezes, rales, or rhonchi. No distress.  Gastrointestinal:  +BS, soft, non-tender and non-distended. No HSM noted. No guarding or rebound. No masses appreciated.  Rectal:  Deferred  Musculoskalatal:  Symmetrical without gross deformities. Skin:  Intact without significant lesions or rashes. Neurologic:  Alert and oriented x4;  grossly normal neurologically. Psych:  Alert and cooperative. Normal mood and affect. Heme/Lymph/Immune: No excessive bruising noted.    12/02/2017 10:38 AM   Disclaimer: This note was dictated with voice recognition software. Similar sounding words can inadvertently be transcribed and may not be corrected upon review.

## 2017-12-02 NOTE — Assessment & Plan Note (Signed)
History of Barrett's esophagus currently due for screening upper endoscopy.  He remains on PPI and we discussed needing to be on this indefinitely.  We will proceed with EGD at this time and follow-up in our office in 1 year.  Proceed with EGD with Dr. Gala Romney in near future: the risks, benefits, and alternatives have been discussed with the patient in detail. The patient states understanding and desires to proceed.  Patient is not currently on any anticoagulants, anxiolytics, chronic pain medications, or antidepressants.  Conscious sedation should be adequate for his procedure.

## 2017-12-03 NOTE — Progress Notes (Signed)
cc'd to pcp 

## 2017-12-04 DIAGNOSIS — M7542 Impingement syndrome of left shoulder: Secondary | ICD-10-CM | POA: Diagnosis not present

## 2017-12-04 DIAGNOSIS — M25512 Pain in left shoulder: Secondary | ICD-10-CM | POA: Diagnosis not present

## 2017-12-04 DIAGNOSIS — M62512 Muscle wasting and atrophy, not elsewhere classified, left shoulder: Secondary | ICD-10-CM | POA: Diagnosis not present

## 2017-12-04 DIAGNOSIS — M25612 Stiffness of left shoulder, not elsewhere classified: Secondary | ICD-10-CM | POA: Diagnosis not present

## 2017-12-10 DIAGNOSIS — M25612 Stiffness of left shoulder, not elsewhere classified: Secondary | ICD-10-CM | POA: Diagnosis not present

## 2017-12-10 DIAGNOSIS — M25512 Pain in left shoulder: Secondary | ICD-10-CM | POA: Diagnosis not present

## 2017-12-10 DIAGNOSIS — M7542 Impingement syndrome of left shoulder: Secondary | ICD-10-CM | POA: Diagnosis not present

## 2017-12-10 DIAGNOSIS — M62512 Muscle wasting and atrophy, not elsewhere classified, left shoulder: Secondary | ICD-10-CM | POA: Diagnosis not present

## 2017-12-12 DIAGNOSIS — M7542 Impingement syndrome of left shoulder: Secondary | ICD-10-CM | POA: Diagnosis not present

## 2017-12-12 DIAGNOSIS — M25612 Stiffness of left shoulder, not elsewhere classified: Secondary | ICD-10-CM | POA: Diagnosis not present

## 2017-12-12 DIAGNOSIS — M62512 Muscle wasting and atrophy, not elsewhere classified, left shoulder: Secondary | ICD-10-CM | POA: Diagnosis not present

## 2017-12-12 DIAGNOSIS — M25512 Pain in left shoulder: Secondary | ICD-10-CM | POA: Diagnosis not present

## 2017-12-15 DIAGNOSIS — M25612 Stiffness of left shoulder, not elsewhere classified: Secondary | ICD-10-CM | POA: Diagnosis not present

## 2017-12-15 DIAGNOSIS — M7542 Impingement syndrome of left shoulder: Secondary | ICD-10-CM | POA: Diagnosis not present

## 2017-12-15 DIAGNOSIS — M62512 Muscle wasting and atrophy, not elsewhere classified, left shoulder: Secondary | ICD-10-CM | POA: Diagnosis not present

## 2017-12-15 DIAGNOSIS — M25512 Pain in left shoulder: Secondary | ICD-10-CM | POA: Diagnosis not present

## 2017-12-18 DIAGNOSIS — M7542 Impingement syndrome of left shoulder: Secondary | ICD-10-CM | POA: Diagnosis not present

## 2017-12-18 DIAGNOSIS — M25512 Pain in left shoulder: Secondary | ICD-10-CM | POA: Diagnosis not present

## 2017-12-18 DIAGNOSIS — M62512 Muscle wasting and atrophy, not elsewhere classified, left shoulder: Secondary | ICD-10-CM | POA: Diagnosis not present

## 2017-12-18 DIAGNOSIS — M25612 Stiffness of left shoulder, not elsewhere classified: Secondary | ICD-10-CM | POA: Diagnosis not present

## 2017-12-22 DIAGNOSIS — M25612 Stiffness of left shoulder, not elsewhere classified: Secondary | ICD-10-CM | POA: Diagnosis not present

## 2017-12-22 DIAGNOSIS — M7542 Impingement syndrome of left shoulder: Secondary | ICD-10-CM | POA: Diagnosis not present

## 2017-12-22 DIAGNOSIS — M62512 Muscle wasting and atrophy, not elsewhere classified, left shoulder: Secondary | ICD-10-CM | POA: Diagnosis not present

## 2017-12-22 DIAGNOSIS — M25512 Pain in left shoulder: Secondary | ICD-10-CM | POA: Diagnosis not present

## 2017-12-25 DIAGNOSIS — M7542 Impingement syndrome of left shoulder: Secondary | ICD-10-CM | POA: Diagnosis not present

## 2017-12-25 DIAGNOSIS — M62512 Muscle wasting and atrophy, not elsewhere classified, left shoulder: Secondary | ICD-10-CM | POA: Diagnosis not present

## 2017-12-25 DIAGNOSIS — M25512 Pain in left shoulder: Secondary | ICD-10-CM | POA: Diagnosis not present

## 2017-12-25 DIAGNOSIS — M25612 Stiffness of left shoulder, not elsewhere classified: Secondary | ICD-10-CM | POA: Diagnosis not present

## 2018-01-06 ENCOUNTER — Other Ambulatory Visit: Payer: Self-pay

## 2018-01-06 ENCOUNTER — Encounter (HOSPITAL_COMMUNITY)
Admission: RE | Disposition: A | Discharge status: Home / Self Care | Payer: Self-pay | Source: Ambulatory Visit | Attending: Internal Medicine

## 2018-01-06 ENCOUNTER — Ambulatory Visit (HOSPITAL_COMMUNITY)
Admission: RE | Admit: 2018-01-06 | Discharge: 2018-01-06 | Disposition: A | Discharge status: Home / Self Care | Payer: Medicare PPO | Source: Ambulatory Visit | Attending: Internal Medicine | Admitting: Internal Medicine

## 2018-01-06 ENCOUNTER — Encounter (HOSPITAL_COMMUNITY): Payer: Self-pay | Admitting: *Deleted

## 2018-01-06 DIAGNOSIS — Z7984 Long term (current) use of oral hypoglycemic drugs: Secondary | ICD-10-CM | POA: Diagnosis not present

## 2018-01-06 DIAGNOSIS — K449 Diaphragmatic hernia without obstruction or gangrene: Secondary | ICD-10-CM | POA: Insufficient documentation

## 2018-01-06 DIAGNOSIS — K219 Gastro-esophageal reflux disease without esophagitis: Secondary | ICD-10-CM | POA: Diagnosis not present

## 2018-01-06 DIAGNOSIS — E119 Type 2 diabetes mellitus without complications: Secondary | ICD-10-CM | POA: Diagnosis not present

## 2018-01-06 DIAGNOSIS — M549 Dorsalgia, unspecified: Secondary | ICD-10-CM | POA: Diagnosis not present

## 2018-01-06 DIAGNOSIS — K3189 Other diseases of stomach and duodenum: Secondary | ICD-10-CM

## 2018-01-06 DIAGNOSIS — Z7982 Long term (current) use of aspirin: Secondary | ICD-10-CM | POA: Insufficient documentation

## 2018-01-06 DIAGNOSIS — Z79899 Other long term (current) drug therapy: Secondary | ICD-10-CM | POA: Insufficient documentation

## 2018-01-06 DIAGNOSIS — G8929 Other chronic pain: Secondary | ICD-10-CM | POA: Insufficient documentation

## 2018-01-06 DIAGNOSIS — I10 Essential (primary) hypertension: Secondary | ICD-10-CM | POA: Diagnosis not present

## 2018-01-06 DIAGNOSIS — K2270 Barrett's esophagus without dysplasia: Secondary | ICD-10-CM | POA: Diagnosis not present

## 2018-01-06 DIAGNOSIS — Z09 Encounter for follow-up examination after completed treatment for conditions other than malignant neoplasm: Secondary | ICD-10-CM | POA: Diagnosis present

## 2018-01-06 DIAGNOSIS — Z9089 Acquired absence of other organs: Secondary | ICD-10-CM | POA: Insufficient documentation

## 2018-01-06 DIAGNOSIS — K5710 Diverticulosis of small intestine without perforation or abscess without bleeding: Secondary | ICD-10-CM | POA: Insufficient documentation

## 2018-01-06 DIAGNOSIS — K319 Disease of stomach and duodenum, unspecified: Secondary | ICD-10-CM | POA: Insufficient documentation

## 2018-01-06 HISTORY — PX: ESOPHAGOGASTRODUODENOSCOPY: SHX5428

## 2018-01-06 HISTORY — PX: BIOPSY: SHX5522

## 2018-01-06 LAB — GLUCOSE, CAPILLARY: Glucose-Capillary: 186 mg/dL — ABNORMAL HIGH (ref 70–99)

## 2018-01-06 SURGERY — EGD (ESOPHAGOGASTRODUODENOSCOPY)
Anesthesia: Moderate Sedation

## 2018-01-06 MED ORDER — STERILE WATER FOR IRRIGATION IR SOLN
Status: DC | PRN
  Administered 2018-01-06: 11:00:00

## 2018-01-06 MED ORDER — MIDAZOLAM HCL 5 MG/5ML IJ SOLN
INTRAMUSCULAR | Status: AC
  Filled 2018-01-06: qty 5

## 2018-01-06 MED ORDER — LIDOCAINE VISCOUS HCL 2 % MT SOLN
OROMUCOSAL | Status: AC
  Filled 2018-01-06: qty 15

## 2018-01-06 MED ORDER — MIDAZOLAM HCL 5 MG/5ML IJ SOLN
INTRAMUSCULAR | Status: DC | PRN
  Administered 2018-01-06: 1 mg via INTRAVENOUS
  Administered 2018-01-06: 2 mg via INTRAVENOUS

## 2018-01-06 MED ORDER — SODIUM CHLORIDE 0.9 % IV SOLN
INTRAVENOUS | Status: DC
  Administered 2018-01-06: 11:00:00 via INTRAVENOUS

## 2018-01-06 MED ORDER — MEPERIDINE HCL 100 MG/ML IJ SOLN
INTRAMUSCULAR | Status: DC | PRN
  Administered 2018-01-06 (×2): 25 mg

## 2018-01-06 MED ORDER — ONDANSETRON HCL 4 MG/2ML IJ SOLN
INTRAMUSCULAR | Status: AC
  Filled 2018-01-06: qty 2

## 2018-01-06 MED ORDER — MEPERIDINE HCL 50 MG/ML IJ SOLN
INTRAMUSCULAR | Status: AC
  Filled 2018-01-06: qty 1

## 2018-01-06 MED ORDER — LIDOCAINE VISCOUS HCL 2 % MT SOLN
OROMUCOSAL | Status: DC | PRN
  Administered 2018-01-06: 1 via OROMUCOSAL

## 2018-01-06 MED ORDER — ONDANSETRON HCL 4 MG/2ML IJ SOLN
INTRAMUSCULAR | Status: DC | PRN
  Administered 2018-01-06: 4 mg via INTRAVENOUS

## 2018-01-06 NOTE — H&P (Signed)
@LOGO @   Primary Care Physician:  [name], MD Primary Gastroenterologist:  Dr. Gala Romney  Pre-Procedure History & Physical: HPI:  Donald Klein is a 72 y.o. male here for surveillance EGD. History of Barrett's esophagus. Clinically, no upper GI tract symptoms. Prilosec controlling reflux very well. No dysphagia. Major health issues days is recurrent GI ST. Followed closely by [facility].  Past Medical History:  Diagnosis Date  . Anxiety   . Barrett esophagus    Last EGD 05/27/08 Barrett's without dyplasia. EGD due 05/2011  . Chronic back pain   . Colitis    In 1980s, dx with UC by Dr. Humphrey Rolls at time of TCS, 1992 TCS, chronic colitis  . Depression   . Diabetes mellitus   . GERD (gastroesophageal reflux disease)   . Helicobacter pylori gastritis 2009   treated  . HTN (hypertension)   . PTSD (post-traumatic stress disorder)   . 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 Tubular adenoma of colon 10/08/10   Last colonosocpy 1 cecal TA    Past Surgical History:  Procedure Laterality Date  . APPENDECTOMY  July 2014   at time of small bowel resection  . CHOLECYSTECTOMY    . COLONOSCOPY  10/08/2010   Normal rectum/tubular adenoma/normal random biopsy. Next TCS due 10/2015.  Marland Kitchen COLONOSCOPY N/A 09/30/2014   Dr. Gala Romney: Redunant colon . Status post segmental biopsy and stool sampling. Negative stool samples and colonic biopsies.   . ESOPHAGOGASTRODUODENOSCOPY  07/31/2011   Dr. Doyce Para segment Barrett's esophagus s/p bx Hiatal hernia. Duodenal diverticulum. No dysplasia.Next EGD 07/2014.  Marland Kitchen ESOPHAGOGASTRODUODENOSCOPY N/A 09/30/2014   Dr. Gala Romney: Abnormal distal esophagus consistant with prior diagnosis of short segment Barrett's esophagus status post biopsy. 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 Hiatal hernia. Abnormal Gastric mucosa of uncertain significance status post gastric biopsy. path with +Barrett's esophagus but no  dysplasia, mild chronic gastritis. Surveillance for Barrett's due in 2019  . EXPLORATORY LAPAROTOMY W/ BOWEL RESECTION  July 2014   [facility]: with lysis of adhesions, small bowel resection X 2, resection of mesenteric mass, appendectomy, path: fibromatosis   . 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 Authorizing Provider  amLODipine (NORVASC) 10 MG tablet Take 10 mg by mouth Daily.  11/25/10  Yes [provider]  aspirin EC 81 MG tablet Take 81 mg by mouth daily.   Yes [provider]  Cholecalciferol (VITAMIN D) 2000 UNITS tablet Take 2,000 Units by mouth daily.   Yes [provider]  CRESTOR 20 MG tablet Take 1 tablet by mouth every morning.  08/05/10  Yes [provider]  ferrous sulfate 325 (65 FE) MG tablet Take 325 mg by mouth daily with breakfast.   Yes [provider]  gabapentin (NEURONTIN) 300 MG capsule Take 1 capsule by mouth at bedtime.    Yes [provider]  glipiZIDE (GLUCOTROL) 5 MG tablet Take 5 mg by mouth daily before breakfast.   Yes [provider]  JANUMET 50-1000 MG tablet Take 1 tablet by mouth 2 (two) times daily with a meal.  11/24/17  Yes [provider]  losartan (COZAAR) 100 MG tablet Take 100  mg by mouth daily.   Yes [provider]  LOVAZA 1 G capsule Take 1 capsule by mouth 2 (two) times daily.  08/05/10  Yes [provider]  magnesium oxide (MAG-OX) 400 MG tablet Take 400 mg by mouth daily.   Yes [provider]  metoprolol (LOPRESSOR) 50 MG tablet Take 50 mg by mouth Twice daily.  07/31/10  Yes [provider]  Multiple Vitamins-Minerals (MULTIVITAMIN WITH MINERALS)  tablet Take 1 tablet by mouth daily.   Yes [provider]  nitroGLYCERIN (NITROSTAT) 0.4 MG SL tablet Place 0.4 mg under the tongue every 5 (five) minutes as needed for chest pain.   Yes [provider]  omeprazole (PRILOSEC) 20 MG capsule Take 20 mg by mouth 2 (two) times daily before a meal.   Yes [provider]  tamsulosin (FLOMAX) 0.4 MG CAPS capsule Take 0.4 mg by mouth every evening.   Yes [provider]  vitamin C (ASCORBIC ACID) 250 MG tablet Take 250 mg by mouth daily.   Yes [provider]    Allergies as of 12/02/2017 - Review Complete 12/02/2017  Allergen Reaction Noted  . Lovenox [enoxaparin sodium] Hives and Rash 09/30/2014  . Penicillins Rash     Family History  Problem Relation Age of Onset  . Heart attack Father        110  . Colon cancer Neg Hx     Social History   Socioeconomic History  . Marital status: Married    Spouse name: Not on file  . Number of children: 1  . Years of education: Not on file  . Highest education level: Not on file  Occupational History  . Occupation: disability  Social Needs  . Financial resource strain: Not on file  . Food insecurity:    Worry: Not on file    Inability: Not on file  . Transportation needs:    Medical: Not on file    Non-medical: Not on file  Tobacco Use  . Smoking status: Never Smoker  . Smokeless tobacco: Never Used  Substance and Sexual Activity  . Alcohol use: No    Alcohol/week: 0.0 standard drinks  . Drug use: No  . Sexual activity: Yes    Partners: Female    Birth control/protection: None    Comment: spouse  Lifestyle  . Physical activity:    Days per week: Not on file    Minutes per session: Not on file  . Stress: Not on file  Relationships  . Social connections:    Talks on phone: Not on file    Gets together: Not on file    Attends religious service: Not on file    Active member of club or organization: Not on file    Attends meetings of  clubs or organizations: Not on file    Relationship status: Not on file  . Intimate partner violence:    Fear of current or ex partner: Not on file    Emotionally abused: Not on file    Physically abused: Not on file    Forced sexual activity: Not on file  Other Topics Concern  . Not on file  Social History Narrative  . Not on file    Review of Systems: See HPI, otherwise negative ROS  Physical Exam: BP 131/85   Pulse 69   Temp 98.6 F (37 C) (Oral)   Resp 18   Ht 5\' 9"  (1.753 m)   Wt 76.2 kg   SpO2  99%   BMI 24.81 kg/m  General:   Alert,  Well-developed, well-nourished, pleasant and cooperative in NAD Mouth:  No deformity or lesions. Neck:  Supple; no masses or thyromegaly. No significant cervical adenopathy. Lungs:  Clear throughout to auscultation.   No wheezes, crackles, or rhonchi. No acute distress. Heart:  Regular rate and rhythm; no murmurs, clicks, rubs,  or gallops. Abdomen: Non-distended, normal bowel sounds.  Soft and nontender without appreciable mass or hepatosplenomegaly.  Pulses:  Normal pulses noted. Extremities:  Without clubbing or edema.  Impression/Plan:  Pleasant 72 year old family history short segment Barrett's esophagus. Clinically, doing well. GERD well controlled. No dysphagia. Surveillance colonoscopy today per plan.  The risks, benefits, limitations, alternatives and imponderables have been reviewed with the patient. Potential for esophageal dilation, biopsy, etc. have also been reviewed.  Questions have been answered. All parties agreeable.     Notice: This dictation was prepared with Dragon dictation along with smaller phrase technology. Any transcriptional errors that result from this process are unintentional and may not be corrected upon review.

## 2018-01-06 NOTE — Discharge Instructions (Signed)

## 2018-01-06 NOTE — Op Note (Signed)
[facility] Patient Name: Donald Klein Procedure Date: 01/06/2018 10:28 AM MRN: [id number] Date of Birth: 1945/05/26 Attending MD: Norvel Richards , MD CSN: [id number] Age: 72 Admit Type: Outpatient Procedure:                Upper GI endoscopy Indications:              Surveillance for malignancy due to personal history                            of Barrett's esophagus Providers:                Norvel Richards, MD, Janeece Riggers, RN, Aram Candela Referring MD:              Medicines:                Midazolam 3 mg IV, Meperidine 50 mg IV, Ondansetron                            4 mg IV Complications:            No immediate complications. Estimated Blood Loss:     Estimated blood loss was minimal. Procedure:                Pre-Anesthesia Assessment:                           - Prior to the procedure, a History and Physical                            was performed, and patient medications and                            allergies were reviewed. The patient's tolerance of                            previous anesthesia was also reviewed. The risks                            and benefits of the procedure and the sedation                            options and risks were discussed with the patient.                            All questions were answered, and informed consent                            was obtained. Prior Anticoagulants: The patient has                            taken no previous anticoagulant or antiplatelet  agents. ASA Grade Assessment: III - A patient with                            severe systemic disease. After reviewing the risks                            and benefits, the patient was deemed in                            satisfactory condition to undergo the procedure.                           After obtaining informed consent, the endoscope was                            passed under direct vision.  Throughout the                            procedure, the patient's blood pressure, pulse, and                            oxygen saturations were monitored continuously. The                            GIF-H190 ([id number]) scope was introduced through the                            mouth, and advanced to the second part of duodenum.                            The upper GI endoscopy was accomplished without                            difficulty. The patient tolerated the procedure                            well. Scope In: 11:37:51 AM Scope Out: 11:44:28 AM Total Procedure Duration: 0 hours 6 minutes 37 seconds  Findings:      Salmon-colored mucosa was present.      Moderate mucosal changes were found in the entire examined stomach.       Mottled mucosa. Patchy areas of significant erythema. No obvious       infiltrating process. No ulcer. This was biopsied with a cold forceps       for histology. Estimated blood loss was minimal. Some antral deformity       noted.      A diverticulum was found in the second portion of the duodenum.       Estimated blood loss was minimal. Impression:               - Salmon-colored mucosa consistent with prior                            diagnosis of short segment Barrett's  esophagus?"biopsied.                           - Hiatal hernia                           Mucosal changes in the stomach of uncertain                            significance. Biopsied.                           - Duodenal diverticulum. Moderate Sedation:      Moderate (conscious) sedation was administered by the endoscopy nurse       and supervised by the endoscopist. The following parameters were       monitored: oxygen saturation, heart rate, blood pressure, respiratory       rate, EKG, adequacy of pulmonary ventilation, and response to care.       Total physician intraservice time was 15 minutes. Recommendation:           - Patient has a contact number  available for                            emergencies. The signs and symptoms of potential                            delayed complications were discussed with the                            patient. Return to normal activities tomorrow.                            Written discharge instructions were provided to the                            patient.                           - Advance diet as tolerated.                           - Continue present medications. Continue Prilosec                            daily indefinitely.                           - No repeat upper endoscopy due to age and                            comorbidities - conditional on biopsy report.                           - Return to GI office (date not yet determined). Procedure Code(s):        --- Professional ---  91694, Esophagogastroduodenoscopy, flexible,                            transoral; with biopsy, single or multiple                           G0500, Moderate sedation services provided by the                            same physician or other qualified health care                            professional performing a gastrointestinal                            endoscopic service that sedation supports,                            requiring the presence of an independent trained                            observer to assist in the monitoring of the                            patient's level of consciousness and physiological                            status; initial 15 minutes of intra-service time;                            patient age 80 years or older (additional time may                            be reported with [phone], as appropriate) Diagnosis Code(s):        --- Professional ---                           K22.70, Barrett's esophagus without dysplasia                           K31.89, Other diseases of stomach and duodenum                           K57.10, Diverticulosis of small  intestine without                            perforation or abscess without bleeding CPT copyright 2017 American Medical Association. All rights reserved. The codes documented in this report are preliminary and upon coder review may  be revised to meet current compliance requirements. Cristopher Estimable. Mahlon Gabrielle, MD Norvel Richards, MD 01/06/2018 11:56:49 AM This report has been signed electronically. Number of Addenda: 0

## 2018-01-08 ENCOUNTER — Encounter: Payer: Self-pay | Admitting: Internal Medicine

## 2018-01-12 ENCOUNTER — Encounter (HOSPITAL_COMMUNITY): Payer: Self-pay | Admitting: Internal Medicine

## 2018-01-14 ENCOUNTER — Telehealth: Payer: Self-pay | Admitting: Internal Medicine

## 2018-01-14 NOTE — Telephone Encounter (Signed)
Spoke with pt. Pt wanted to discuss the papers he read with his results. Pt understands that he doesn't need a procedure unless he starts having symptoms of reflux ect. Pt asked if we could mail him diet info on avoiding reflux. Gerd info mailed to pt today.

## 2018-01-14 NOTE — Telephone Encounter (Signed)
[phone]  Please call patient, he has questions about his procedure

## 2018-01-20 ENCOUNTER — Ambulatory Visit (HOSPITAL_COMMUNITY)
Admission: RE | Admit: 2018-01-20 | Discharge: 2018-01-20 | Disposition: A | Discharge status: Home / Self Care | Payer: Medicare PPO | Source: Ambulatory Visit | Attending: Family Medicine | Admitting: Family Medicine

## 2018-01-20 ENCOUNTER — Other Ambulatory Visit (HOSPITAL_COMMUNITY): Payer: Self-pay | Admitting: Family Medicine

## 2018-01-20 DIAGNOSIS — R202 Paresthesia of skin: Secondary | ICD-10-CM | POA: Diagnosis not present

## 2018-01-20 DIAGNOSIS — M25511 Pain in right shoulder: Secondary | ICD-10-CM | POA: Diagnosis not present

## 2018-01-20 DIAGNOSIS — Z79899 Other long term (current) drug therapy: Secondary | ICD-10-CM | POA: Diagnosis not present

## 2018-01-20 DIAGNOSIS — I10 Essential (primary) hypertension: Secondary | ICD-10-CM | POA: Diagnosis not present

## 2018-01-20 DIAGNOSIS — M19012 Primary osteoarthritis, left shoulder: Secondary | ICD-10-CM | POA: Diagnosis not present

## 2018-01-20 DIAGNOSIS — M25512 Pain in left shoulder: Secondary | ICD-10-CM | POA: Diagnosis not present

## 2018-01-20 DIAGNOSIS — M19011 Primary osteoarthritis, right shoulder: Secondary | ICD-10-CM | POA: Diagnosis not present

## 2018-01-20 DIAGNOSIS — E118 Type 2 diabetes mellitus with unspecified complications: Secondary | ICD-10-CM | POA: Diagnosis not present

## 2018-02-07 DIAGNOSIS — I10 Essential (primary) hypertension: Secondary | ICD-10-CM | POA: Diagnosis not present

## 2018-02-07 DIAGNOSIS — E119 Type 2 diabetes mellitus without complications: Secondary | ICD-10-CM | POA: Diagnosis not present

## 2018-02-07 DIAGNOSIS — Z125 Encounter for screening for malignant neoplasm of prostate: Secondary | ICD-10-CM | POA: Diagnosis not present

## 2018-02-10 DIAGNOSIS — I10 Essential (primary) hypertension: Secondary | ICD-10-CM | POA: Diagnosis not present

## 2018-02-10 DIAGNOSIS — M25511 Pain in right shoulder: Secondary | ICD-10-CM | POA: Diagnosis not present

## 2018-02-10 DIAGNOSIS — Z7984 Long term (current) use of oral hypoglycemic drugs: Secondary | ICD-10-CM | POA: Diagnosis not present

## 2018-02-10 DIAGNOSIS — R351 Nocturia: Secondary | ICD-10-CM | POA: Diagnosis not present

## 2018-02-10 DIAGNOSIS — Z79899 Other long term (current) drug therapy: Secondary | ICD-10-CM | POA: Diagnosis not present

## 2018-02-10 DIAGNOSIS — E7800 Pure hypercholesterolemia, unspecified: Secondary | ICD-10-CM | POA: Diagnosis not present

## 2018-02-10 DIAGNOSIS — M25512 Pain in left shoulder: Secondary | ICD-10-CM | POA: Diagnosis not present

## 2018-02-10 DIAGNOSIS — R972 Elevated prostate specific antigen [PSA]: Secondary | ICD-10-CM | POA: Diagnosis not present

## 2018-02-10 DIAGNOSIS — E1165 Type 2 diabetes mellitus with hyperglycemia: Secondary | ICD-10-CM | POA: Diagnosis not present

## 2018-02-11 ENCOUNTER — Encounter: Payer: Self-pay | Admitting: Orthopaedic Surgery

## 2018-02-11 ENCOUNTER — Ambulatory Visit (INDEPENDENT_AMBULATORY_CARE_PROVIDER_SITE_OTHER): Payer: Medicare PPO | Admitting: Orthopaedic Surgery

## 2018-02-11 VITALS — BP 132/74 | HR 66 | Ht 69.0 in | Wt 173.0 lb

## 2018-02-11 DIAGNOSIS — M25512 Pain in left shoulder: Secondary | ICD-10-CM | POA: Diagnosis not present

## 2018-02-11 MED ORDER — NAPROXEN 500 MG PO TABS: 500.0000 mg | ORAL_TABLET | Freq: Two times a day (BID) | ORAL | 5 refills | Status: DC
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
## 2018-02-11 NOTE — Patient Instructions (Signed)
..  Your MRI has been ordered.  We will contact your insurance company for approval. After the authorization is received the MRI and a return appointment will be scheduled with you by phone.  If you do not hear from us within 5 business days please call [phone] and ask for the pre-authorization representative in our office.  

## 2018-02-17 ENCOUNTER — Ambulatory Visit (HOSPITAL_COMMUNITY)
Admission: RE | Admit: 2018-02-17 | Discharge: 2018-02-17 | Disposition: A | Discharge status: Home / Self Care | Payer: Medicare PPO | Source: Ambulatory Visit | Attending: Orthopaedic Surgery | Admitting: Orthopaedic Surgery

## 2018-02-17 DIAGNOSIS — M19012 Primary osteoarthritis, left shoulder: Secondary | ICD-10-CM | POA: Diagnosis not present

## 2018-02-17 DIAGNOSIS — M659 Synovitis and tenosynovitis, unspecified: Secondary | ICD-10-CM | POA: Diagnosis not present

## 2018-02-17 DIAGNOSIS — M25512 Pain in left shoulder: Secondary | ICD-10-CM | POA: Diagnosis not present

## 2018-02-19 ENCOUNTER — Encounter: Payer: Self-pay | Admitting: Orthopaedic Surgery

## 2018-02-19 ENCOUNTER — Ambulatory Visit (INDEPENDENT_AMBULATORY_CARE_PROVIDER_SITE_OTHER): Payer: Medicare PPO | Admitting: Orthopaedic Surgery

## 2018-02-19 VITALS — BP 134/84 | HR 67 | Ht 69.0 in | Wt 169.0 lb

## 2018-02-19 DIAGNOSIS — M25512 Pain in left shoulder: Secondary | ICD-10-CM | POA: Diagnosis not present

## 2018-02-19 NOTE — Progress Notes (Signed)
PROCEDURE NOTE:  The patient request injection, verbal consent was obtained.  The left shoulder was prepped appropriately after time out was performed.   Sterile technique was observed and injection of 1 cc of Depo-Medrol 40 mg with several cc's of plain xylocaine. Anesthesia was provided by ethyl chloride and a 20-gauge needle was used to inject the shoulder area. A posterior approach was used.  The injection was tolerated well.  A band aid dressing was applied.  The patient was advised to apply ice later today and tomorrow to the injection sight as needed.  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 There is synovitis about the joint with focal synovial hypertrophy noted in the subscapularis recess. Associated mild, patchy reactive marrow edema in the coracoid process is noted.  Findings compatible with adhesive capsulitis.  Intact and normal appearing rotator cuff and long head of biceps.  Mild acromioclavicular osteoarthritis.  I have given rubber tubing to use and showed him how to use it.  Return in one month.  Electronically Signed Sanjuana Kava, MD 10/17/20199:52 AM

## 2018-03-19 ENCOUNTER — Encounter: Payer: Self-pay | Admitting: Orthopaedic Surgery

## 2018-03-19 ENCOUNTER — Ambulatory Visit (INDEPENDENT_AMBULATORY_CARE_PROVIDER_SITE_OTHER): Payer: Medicare PPO | Admitting: Orthopaedic Surgery

## 2018-03-19 VITALS — BP 169/79 | HR 75 | Ht 69.0 in | Wt 169.0 lb

## 2018-03-19 DIAGNOSIS — M25512 Pain in left shoulder: Secondary | ICD-10-CM

## 2018-03-19 NOTE — Progress Notes (Signed)
PROCEDURE NOTE:  The patient request injection, verbal consent was obtained.  The left shoulder was prepped appropriately after time out was performed.   Sterile technique was observed and injection of 1 cc of Depo-Medrol 40 mg with several cc's of plain xylocaine. Anesthesia was provided by ethyl chloride and a 20-gauge needle was used to inject the shoulder area. A posterior approach was used.  The injection was tolerated well.  A band aid dressing was applied.  The patient was advised to apply ice later today and tomorrow to the injection sight as needed.  Electronically Signed Sanjuana Kava, MD 11/14/201910:11 AM

## 2018-04-14 ENCOUNTER — Encounter: Payer: Self-pay | Admitting: Orthopaedic Surgery

## 2018-04-14 ENCOUNTER — Ambulatory Visit (INDEPENDENT_AMBULATORY_CARE_PROVIDER_SITE_OTHER): Payer: Medicare PPO | Admitting: Orthopaedic Surgery

## 2018-04-14 VITALS — BP 144/77 | HR 78 | Ht 69.0 in | Wt 169.0 lb

## 2018-04-14 DIAGNOSIS — M25512 Pain in left shoulder: Secondary | ICD-10-CM | POA: Diagnosis not present

## 2018-04-14 NOTE — Progress Notes (Signed)
CC:  My shoulder is all good  He has no pain of the left shoulder, full ROM, NV intact.  Encounter Diagnosis  Name Primary?  . Pain in joint of left shoulder Yes   I will see as needed.  Call if any problem.  Precautions discussed.   Electronically Signed Sanjuana Kava, MD 12/10/20199:41 AM

## 2018-04-22 DIAGNOSIS — J302 Other seasonal allergic rhinitis: Secondary | ICD-10-CM | POA: Diagnosis not present

## 2018-04-22 DIAGNOSIS — Z7984 Long term (current) use of oral hypoglycemic drugs: Secondary | ICD-10-CM | POA: Diagnosis not present

## 2018-04-22 DIAGNOSIS — E119 Type 2 diabetes mellitus without complications: Secondary | ICD-10-CM | POA: Diagnosis not present

## 2018-04-22 DIAGNOSIS — I10 Essential (primary) hypertension: Secondary | ICD-10-CM | POA: Diagnosis not present

## 2018-05-08 DIAGNOSIS — E1165 Type 2 diabetes mellitus with hyperglycemia: Secondary | ICD-10-CM | POA: Diagnosis not present

## 2018-05-08 DIAGNOSIS — I10 Essential (primary) hypertension: Secondary | ICD-10-CM | POA: Diagnosis not present

## 2018-05-08 DIAGNOSIS — E7800 Pure hypercholesterolemia, unspecified: Secondary | ICD-10-CM | POA: Diagnosis not present

## 2018-05-12 DIAGNOSIS — Z79899 Other long term (current) drug therapy: Secondary | ICD-10-CM | POA: Diagnosis not present

## 2018-05-12 DIAGNOSIS — E118 Type 2 diabetes mellitus with unspecified complications: Secondary | ICD-10-CM | POA: Diagnosis not present

## 2018-05-12 DIAGNOSIS — I10 Essential (primary) hypertension: Secondary | ICD-10-CM | POA: Diagnosis not present

## 2018-05-12 DIAGNOSIS — Z7984 Long term (current) use of oral hypoglycemic drugs: Secondary | ICD-10-CM | POA: Diagnosis not present

## 2018-05-12 DIAGNOSIS — E7800 Pure hypercholesterolemia, unspecified: Secondary | ICD-10-CM | POA: Diagnosis not present

## 2018-05-16 ENCOUNTER — Other Ambulatory Visit: Payer: Self-pay

## 2018-05-16 ENCOUNTER — Encounter (HOSPITAL_COMMUNITY): Payer: Self-pay | Admitting: Emergency Medicine

## 2018-05-16 ENCOUNTER — Emergency Department (HOSPITAL_COMMUNITY)
Admission: EM | Admit: 2018-05-16 | Discharge: 2018-05-16 | Disposition: A | Discharge status: Home / Self Care | Payer: Medicare PPO | Attending: Emergency Medicine | Admitting: Emergency Medicine

## 2018-05-16 ENCOUNTER — Emergency Department (HOSPITAL_COMMUNITY): Payer: Medicare PPO

## 2018-05-16 DIAGNOSIS — R109 Unspecified abdominal pain: Secondary | ICD-10-CM | POA: Diagnosis present

## 2018-05-16 DIAGNOSIS — R197 Diarrhea, unspecified: Secondary | ICD-10-CM | POA: Insufficient documentation

## 2018-05-16 DIAGNOSIS — R1084 Generalized abdominal pain: Secondary | ICD-10-CM | POA: Insufficient documentation

## 2018-05-16 DIAGNOSIS — N400 Enlarged prostate without lower urinary tract symptoms: Secondary | ICD-10-CM | POA: Diagnosis not present

## 2018-05-16 DIAGNOSIS — E279 Disorder of adrenal gland, unspecified: Secondary | ICD-10-CM | POA: Diagnosis not present

## 2018-05-16 DIAGNOSIS — R42 Dizziness and giddiness: Secondary | ICD-10-CM | POA: Insufficient documentation

## 2018-05-16 DIAGNOSIS — I10 Essential (primary) hypertension: Secondary | ICD-10-CM | POA: Insufficient documentation

## 2018-05-16 DIAGNOSIS — R112 Nausea with vomiting, unspecified: Secondary | ICD-10-CM | POA: Insufficient documentation

## 2018-05-16 DIAGNOSIS — Z7982 Long term (current) use of aspirin: Secondary | ICD-10-CM | POA: Insufficient documentation

## 2018-05-16 DIAGNOSIS — R05 Cough: Secondary | ICD-10-CM | POA: Diagnosis not present

## 2018-05-16 DIAGNOSIS — N390 Urinary tract infection, site not specified: Secondary | ICD-10-CM | POA: Insufficient documentation

## 2018-05-16 DIAGNOSIS — Z7984 Long term (current) use of oral hypoglycemic drugs: Secondary | ICD-10-CM | POA: Insufficient documentation

## 2018-05-16 DIAGNOSIS — E119 Type 2 diabetes mellitus without complications: Secondary | ICD-10-CM | POA: Diagnosis not present

## 2018-05-16 DIAGNOSIS — Z79899 Other long term (current) drug therapy: Secondary | ICD-10-CM | POA: Insufficient documentation

## 2018-05-16 LAB — COMPREHENSIVE METABOLIC PANEL
ALT: 47 U/L — ABNORMAL HIGH (ref 0–44)
AST: 39 U/L (ref 15–41)
Albumin: 3.8 g/dL (ref 3.5–5.0)
Alkaline Phosphatase: 48 U/L (ref 38–126)
Anion gap: 11 (ref 5–15)
BUN: 22 mg/dL (ref 8–23)
CO2: 21 mmol/L — ABNORMAL LOW (ref 22–32)
Calcium: 9.1 mg/dL (ref 8.9–10.3)
Chloride: 102 mmol/L (ref 98–111)
Creatinine, Ser: 1.5 mg/dL — ABNORMAL HIGH (ref 0.61–1.24)
GFR calc Af Amer: 52 mL/min — ABNORMAL LOW (ref >60)
GFR calc non Af Amer: 45 mL/min — ABNORMAL LOW (ref >60)
Glucose, Bld: 121 mg/dL — ABNORMAL HIGH (ref 70–99)
Potassium: 3.9 mmol/L (ref 3.5–5.1)
Sodium: 134 mmol/L — ABNORMAL LOW (ref 135–145)
Total Bilirubin: 0.5 mg/dL (ref 0.3–1.2)
Total Protein: 7.8 g/dL (ref 6.5–8.1)

## 2018-05-16 LAB — URINALYSIS, ROUTINE W REFLEX MICROSCOPIC
Bilirubin Urine: NEGATIVE
Glucose, UA: NEGATIVE mg/dL
Ketones, ur: NEGATIVE mg/dL
Nitrite: NEGATIVE
Protein, ur: 30 mg/dL — AB
RBC / HPF: >50 RBC/hpf — ABNORMAL HIGH (ref 0–5)
Specific Gravity, Urine: 1.02 (ref 1.005–1.030)
WBC, UA: >50 WBC/hpf — ABNORMAL HIGH (ref 0–5)
pH: 5 (ref 5.0–8.0)

## 2018-05-16 LAB — DIFFERENTIAL
Basophils Absolute: 0 10*3/uL (ref 0.0–0.1)
Basophils Relative: 0 %
Eosinophils Absolute: 0.2 10*3/uL (ref 0.0–0.5)
Eosinophils Relative: 3 %
Lymphocytes Relative: 25 %
Lymphs Abs: 1.5 10*3/uL (ref 0.7–4.0)
Monocytes Absolute: 1.2 10*3/uL — ABNORMAL HIGH (ref 0.1–1.0)
Monocytes Relative: 20 %
Neutro Abs: 3 10*3/uL (ref 1.7–7.7)
Neutrophils Relative %: 52 %

## 2018-05-16 LAB — CBC
HCT: 35.2 % — ABNORMAL LOW (ref 39.0–52.0)
Hemoglobin: 11.7 g/dL — ABNORMAL LOW (ref 13.0–17.0)
MCH: 28.3 pg (ref 26.0–34.0)
MCHC: 33.2 g/dL (ref 30.0–36.0)
MCV: 85 fL (ref 80.0–100.0)
Platelets: 219 10*3/uL (ref 150–400)
RBC: 4.14 MIL/uL — ABNORMAL LOW (ref 4.22–5.81)
RDW: 13.8 % (ref 11.5–15.5)
WBC: 5.9 10*3/uL (ref 4.0–10.5)
nRBC: 0 % (ref 0.0–0.2)

## 2018-05-16 LAB — TROPONIN I: Troponin I: <0.03 ng/mL (ref <0.03)

## 2018-05-16 LAB — LIPASE, BLOOD: Lipase: 39 U/L (ref 11–51)

## 2018-05-16 MED ORDER — ONDANSETRON HCL 4 MG/2ML IJ SOLN: 4.0000 mg | INTRAMUSCULAR | Status: DC | PRN

## 2018-05-16 MED ORDER — FAMOTIDINE IN NACL 20-0.9 MG/50ML-% IV SOLN
20.0000 mg | Freq: Once | INTRAVENOUS | Status: AC
  Administered 2018-05-16: 20 mg via INTRAVENOUS
  Filled 2018-05-16: qty 50

## 2018-05-16 MED ORDER — SODIUM CHLORIDE 0.9 % IV BOLUS
500.0000 mL | Freq: Once | INTRAVENOUS | Status: AC
  Administered 2018-05-16: 500 mL via INTRAVENOUS

## 2018-05-16 MED ORDER — CIPROFLOXACIN HCL 500 MG PO TABS: 500.0000 mg | ORAL_TABLET | Freq: Two times a day (BID) | ORAL | 0 refills | Status: DC

## 2018-05-16 NOTE — ED Provider Notes (Signed)
[facility] EMERGENCY DEPARTMENT Provider Note   CSN: [id number] Arrival date & time: 05/16/18  1625     History   Chief Complaint Chief Complaint  Patient presents with  . Abdominal Pain  . Nausea  . Emesis    HPI Donald Klein is a 73 y.o. male.  HPI  Pt was seen at 1700. Per pt, c/o gradual onset and persistence of multiple intermittent episodes of N/V/D that began 3 days ago.   Describes the stools as "loose" and "watery." Has been associated with generalized abd "pain," and "dark urine."  Pt states he has been feeling lightheaded when he moves from sitting to standing for the past 2 to 3 weeks.  Denies CP/palpitations, no cough/SOB, no back pain, no fevers, no black or blood in stools or emesis, no focal motor weakness, no tingling/numbness in extremities, no visual changes, no ataxia, no slurred speech, no facial droop.  .    Past Medical History:  Diagnosis Date  . Anxiety   . Barrett esophagus    Last EGD 05/27/08 Barrett's without dyplasia. EGD due 05/2011  . Chronic back pain   . Colitis    In 1980s, dx with UC by Dr. Humphrey Rolls at time of TCS, 1992 TCS, chronic colitis  . Depression   . Diabetes mellitus   . GERD (gastroesophageal reflux disease)   . Helicobacter pylori gastritis 2009   treated  . HTN (hypertension)   . PTSD (post-traumatic stress disorder)   . 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 Tubular adenoma of colon 10/08/10   Last colonosocpy 1 cecal TA    Patient Active Problem List   Diagnosis Date Noted  . Pancreatic lesion 06/07/2015  . Mucosal abnormality of stomach   . Abdominal pain, epigastric 09/29/2012  . 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 Tubular adenoma of colon 10/08/2010  . Diarrhea 08/21/2010  . Colitis 08/21/2010  . Barrett esophagus 08/21/2010  . DM 08/16/2008  . ANXIETY 08/16/2008  . POSTTRAUMATIC STRESS DISORDER 08/16/2008  . DEPRESSION 08/16/2008   . HYPERTENSION 08/16/2008  . GASTROESOPHAGEAL REFLUX DISEASE, CHRONIC 08/16/2008  . BACK PAIN, CHRONIC 08/16/2008  . Personal history of other diseases of digestive system 08/16/2008    Past Surgical History:  Procedure Laterality Date  . APPENDECTOMY  July 2014   at time of small bowel resection  . BIOPSY  01/06/2018   Procedure: BIOPSY;  Surgeon: Daneil Dolin, MD;  Location: [facility];  Service: Endoscopy;;  gastric biopsy , esophageal biopsy  . CHOLECYSTECTOMY    . COLONOSCOPY  10/08/2010   Normal rectum/tubular adenoma/normal random biopsy. Next TCS due 10/2015.  Marland Kitchen COLONOSCOPY N/A 09/30/2014   Dr. Gala Romney: Redunant colon . Status post segmental biopsy and stool sampling. Negative stool samples and colonic biopsies.   . ESOPHAGOGASTRODUODENOSCOPY  07/31/2011   Dr. Doyce Para segment Barrett's esophagus s/p bx Hiatal hernia. Duodenal diverticulum. No dysplasia.Next EGD 07/2014.  Marland Kitchen ESOPHAGOGASTRODUODENOSCOPY N/A 09/30/2014   Dr. Gala Romney: Abnormal distal esophagus consistant with prior diagnosis of short segment Barrett's esophagus status post biopsy. 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 Hiatal hernia. Abnormal Gastric mucosa of uncertain significance status post gastric biopsy. path with +Barrett's esophagus but no dysplasia, mild chronic gastritis. Surveillance for Barrett's due in 2019  . ESOPHAGOGASTRODUODENOSCOPY N/A 01/06/2018   Procedure: ESOPHAGOGASTRODUODENOSCOPY (EGD);  Surgeon: Daneil Dolin, MD;  Location: [facility];  Service: Endoscopy;  Laterality:  N/A;  12:00pm  . EXPLORATORY LAPAROTOMY W/ BOWEL RESECTION  July 2014   [facility]: with lysis of adhesions, small bowel resection X 2, resection of mesenteric mass, appendectomy, path: fibromatosis   . 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 Authorizing Provider  amLODipine (NORVASC) 10 MG tablet Take 10 mg by mouth Daily.  11/25/10   [provider]  aspirin EC 81 MG tablet Take 81 mg by mouth daily.    [provider]  Cholecalciferol (VITAMIN D) 2000 UNITS tablet Take 2,000 Units by mouth daily.    [provider]  CRESTOR 20 MG tablet Take 1 tablet by mouth every morning.  08/05/10   [provider]  ferrous sulfate 325 (65 FE) MG tablet Take 325 mg by mouth daily with breakfast.    [provider]  gabapentin (NEURONTIN) 300 MG capsule Take 1 capsule by mouth at bedtime.     [provider]  glipiZIDE (GLUCOTROL) 5 MG tablet Take 5 mg by mouth daily before breakfast.    [provider]  JANUMET 50-1000 MG tablet Take 1 tablet by mouth 2 (two) times daily with a meal.  11/24/17   [provider]  losartan (COZAAR) 100 MG tablet Take 100 mg by mouth daily.    [provider]  LOVAZA 1 G capsule Take 1 capsule by mouth 2 (two) times daily.  08/05/10   [provider]  magnesium oxide (MAG-OX) 400 MG tablet Take 400 mg by mouth daily.    [provider]  metoprolol (LOPRESSOR) 50 MG tablet Take 50 mg by mouth Twice daily.  07/31/10   [provider]  Multiple Vitamins-Minerals (MULTIVITAMIN WITH MINERALS) tablet Take 1 tablet by mouth daily.    [provider]  naproxen (NAPROSYN) 500 MG tablet Take 1 tablet (500 mg total) by mouth 2 (two) times daily with a meal. 02/11/18   Sanjuana Kava, MD  nitroGLYCERIN (NITROSTAT) 0.4 MG SL tablet Place 0.4 mg under the tongue every 5 (five) minutes as needed for chest pain.    [provider]  omeprazole (PRILOSEC) 20 MG capsule Take 20 mg by mouth 2 (two) times daily before a meal.    [provider]  OZEMPIC, 0.25 OR 0.5  MG/DOSE, 2 MG/1.5ML SOPN INJECY 0.5 MG INTO THE SKIN ONCE WEEKLY FOR DIABETES 02/10/18   [provider]  tamsulosin (FLOMAX) 0.4 MG CAPS capsule Take 0.4 mg by mouth every evening.    [provider]  vitamin C (ASCORBIC ACID) 250 MG tablet Take 250 mg by mouth daily.    [provider]    Family History Family History  Problem Relation Age of Onset  . Heart disease Mother   . Suicidality Mother   . Heart attack Father        29  . Heart disease Father   . Heart disease Sister   . Colon cancer Neg Hx     Social History Social History   Tobacco Use  . Smoking status: Never Smoker  . Smokeless tobacco: Never Used  Substance Use Topics  . Alcohol use: No  Alcohol/week: 0.0 standard drinks  . Drug use: No     Allergies   Lisinopril; Lovenox [enoxaparin sodium]; and Penicillins   Review of Systems Review of Systems ROS: Statement: All systems negative except as marked or noted in the HPI; Constitutional: Negative for fever and chills. ; ; Eyes: Negative for eye pain, redness and discharge. ; ; ENMT: Negative for ear pain, hoarseness, nasal congestion, sinus pressure and sore throat. ; ; Cardiovascular: Negative for chest pain, palpitations, diaphoresis, dyspnea and peripheral edema. ; ; Respiratory: Negative for cough, wheezing and stridor. ; ; Gastrointestinal: +N/V/D, abd pain. Negative for blood in stool, hematemesis, jaundice and rectal bleeding. . ; ; Genitourinary: Negative for dysuria, flank pain and hematuria. ; ; Genital:  No penile drainage or rash, no testicular pain or swelling, no scrotal rash or swelling. ;; Musculoskeletal: Negative for back pain and neck pain. Negative for swelling and trauma.; ; Skin: Negative for pruritus, rash, abrasions, blisters, bruising and skin lesion.; ; Neuro: +lightheadedness. Negative for headache and neck stiffness. Negative for weakness, altered level of consciousness, altered mental status, extremity weakness,  paresthesias, involuntary movement, seizure and syncope.       Physical Exam Updated Vital Signs BP 138/73 (BP Location: Right Arm)   Pulse 82   Temp 98.2 F (36.8 C) (Oral)   Resp 14   Ht 5\' 9"  (1.753 m)   Wt 70.8 kg   SpO2 100%   BMI 23.04 kg/m     18:45 Orthostatic Vital Signs FS  Orthostatic Lying   BP- Lying: 151/77  Pulse- Lying: 88      Orthostatic Sitting  BP- Sitting: 124/74  Pulse- Sitting: 85      Orthostatic Standing at 0 minutes  BP- Standing at 0 minutes: 134/66  Pulse- Standing at 0 minutes: 88     Physical Exam 1705: Physical examination:  Nursing notes reviewed; Vital signs and O2 SAT reviewed;  Constitutional: Well developed, Well nourished, Well hydrated, In no acute distress; Head:  Normocephalic, atraumatic; Eyes: EOMI, PERRL, No scleral icterus; ENMT: Mouth and pharynx normal, Mucous membranes moist; Neck: Supple, Full range of motion, No lymphadenopathy; Cardiovascular: Regular rate and rhythm, No gallop; Respiratory: Breath sounds clear & equal bilaterally, No wheezes.  Speaking full sentences with ease, Normal respiratory effort/excursion; Chest: Nontender, Movement normal; Abdomen: Soft, +mild diffuse tenderness to palp. No rebound or guarding. Nondistended, Normal bowel sounds; Genitourinary: No CVA tenderness; Extremities: Peripheral pulses normal, No tenderness, No edema, No calf edema or asymmetry.; Neuro: AA&Ox3, tangential. No facial droop. Major CN grossly intact.  Speech clear. No gross focal motor or sensory deficits in extremities.; Skin: Color normal, Warm, Dry.   ED Treatments / Results  Labs (all labs ordered are listed, but only abnormal results are displayed)   EKG EKG Interpretation  Date/Time:  Saturday May 16 2018 18:42:38 EST Ventricular Rate:  85 PR Interval:    QRS Duration: 81 QT Interval:  363 QTC Calculation: 432 R Axis:   -18 Text Interpretation:  Sinus rhythm Atrial premature complex Borderline left axis  deviation Borderline T abnormalities, inferior leads Baseline wander When compared with ECG of 11/02/2016 No significant change was found Confirmed by Francine Graven [id number]) on 05/16/2018 6:52:12 PM   Radiology   Procedures Procedures (including critical care time)  Medications Ordered in ED Medications  ondansetron (ZOFRAN) injection 4 mg (has no administration in time range)     Initial Impression / Assessment and Plan / ED Course  I have reviewed the triage  vital signs and the nursing notes.  Pertinent labs & imaging results that were available during my care of the patient were reviewed by me and considered in my medical decision making (see chart for details).  MDM Reviewed: previous chart, nursing note and vitals Reviewed previous: labs and ECG Interpretation: labs, ECG, x-ray and CT scan    Results for orders placed or performed during the hospital encounter of 05/16/18  Lipase, blood  Result Value Ref Range   Lipase 39 11 - 51 U/L  Comprehensive metabolic panel  Result Value Ref Range   Sodium 134 (L) 135 - 145 mmol/L   Potassium 3.9 3.5 - 5.1 mmol/L   Chloride 102 98 - 111 mmol/L   CO2 21 (L) 22 - 32 mmol/L   Glucose, Bld 121 (H) 70 - 99 mg/dL   BUN 22 8 - 23 mg/dL   Creatinine, Ser 1.50 (H) 0.61 - 1.24 mg/dL   Calcium 9.1 8.9 - 10.3 mg/dL   Total Protein 7.8 6.5 - 8.1 g/dL   Albumin 3.8 3.5 - 5.0 g/dL   AST 39 15 - 41 U/L   ALT 47 (H) 0 - 44 U/L   Alkaline Phosphatase 48 38 - 126 U/L   Total Bilirubin 0.5 0.3 - 1.2 mg/dL   GFR calc non Af Amer 45 (L) >60 mL/min   GFR calc Af Amer 52 (L) >60 mL/min   Anion gap 11 5 - 15  CBC  Result Value Ref Range   WBC 5.9 4.0 - 10.5 K/uL   RBC 4.14 (L) 4.22 - 5.81 MIL/uL   Hemoglobin 11.7 (L) 13.0 - 17.0 g/dL   HCT 35.2 (L) 39.0 - 52.0 %   MCV 85.0 80.0 - 100.0 fL   MCH 28.3 26.0 - 34.0 pg   MCHC 33.2 30.0 - 36.0 g/dL   RDW 13.8 11.5 - 15.5 %   Platelets 219 150 - 400 K/uL   nRBC 0.0 0.0 - 0.2 %  Urinalysis,  Routine w reflex microscopic  Result Value Ref Range   Color, Urine AMBER (A) YELLOW   APPearance HAZY (A) CLEAR   Specific Gravity, Urine 1.020 1.005 - 1.030   pH 5.0 5.0 - 8.0   Glucose, UA NEGATIVE NEGATIVE mg/dL   Hgb urine dipstick MODERATE (A) NEGATIVE   Bilirubin Urine NEGATIVE NEGATIVE   Ketones, ur NEGATIVE NEGATIVE mg/dL   Protein, ur 30 (A) NEGATIVE mg/dL   Nitrite NEGATIVE NEGATIVE   Leukocytes, UA MODERATE (A) NEGATIVE   RBC / HPF >50 (H) 0 - 5 RBC/hpf   WBC, UA >50 (H) 0 - 5 WBC/hpf   Bacteria, UA RARE (A) NONE SEEN   Squamous Epithelial / LPF 0-5 0 - 5   Mucus PRESENT    Hyaline Casts, UA PRESENT   Troponin I - ONCE - STAT  Result Value Ref Range   Troponin I <0.03 <0.03 ng/mL  Differential  Result Value Ref Range   Neutrophils Relative % 52 %   Neutro Abs 3.0 1.7 - 7.7 K/uL   Lymphocytes Relative 25 %   Lymphs Abs 1.5 0.7 - 4.0 K/uL   Monocytes Relative 20 %   Monocytes Absolute 1.2 (H) 0.1 - 1.0 K/uL   Eosinophils Relative 3 %   Eosinophils Absolute 0.2 0.0 - 0.5 K/uL   Basophils Relative 0 %   Basophils Absolute 0.0 0.0 - 0.1 K/uL    Ct Abdomen Pelvis Wo Contrast Result Date: 05/16/2018 CLINICAL DATA:  One-week history of abdominal pain  and dysuria. Prior abdominal surgery with small bowel resection, lysis of adhesions, appendectomy and removal of a mesenteric mass, pathology fibromatosis. EXAM: CT ABDOMEN AND PELVIS WITHOUT CONTRAST TECHNIQUE: Multidetector CT imaging of the abdomen and pelvis was performed following the standard protocol without IV contrast. COMPARISON:  10/02/2012. FINDINGS: Lower chest: Minimal scar/atelectasis involving the lower lobes. Visualized lung bases otherwise clear. Small benign subpleural lipoma anterior to the RIGHT MIDDLE LOBE as noted previously. Stable normal heart size. Hepatobiliary: Normal unenhanced appearance of the liver. Surgically absent gallbladder. No biliary ductal dilation. Pancreas: Normal unenhanced appearance.  Spleen: Normal unenhanced appearance. Adrenals/Urinary Tract: BILATERAL adrenal hyperplasia as noted previously, unchanged. Benign cyst arising from the UPPER pole of the LEFT kidney. Allowing for the unenhanced technique, no solid masses involving either kidney. No hydronephrosis. No urinary tract calculi. Normal appearing urinary bladder. Stomach/Bowel: Stomach normal in appearance for the degree of distention. Small bowel anastomosis in the mid abdomen. No evidence of bowel obstruction. Mildly distended bowel loops at the anastomoses indicates mild atony and is an expected finding. Normal appearing colon containing both solid and liquid stool. Surgically absent appendix. Vascular/Lymphatic: Moderate aortoiliofemoral atherosclerosis without evidence of aneurysm. No pathologic lymphadenopathy. Reproductive: Moderate prostate gland enlargement, particularly the median lobe. Normal seminal vesicles. Other: Scarring near the root of the mesentry at the site of the prior surgery. Large phlebolith in the RIGHT side of the low low pelvis. Musculoskeletal: Spondylosis involving the visualized LOWER thoracic spine. Regional skeleton otherwise unremarkable. IMPRESSION: 1. No acute abnormalities involving the abdomen or pelvis. 2. Stable benign bilateral adrenal hyperplasia. 3. Moderate prostate gland enlargement, particularly the median lobe. 4.  Aortic Atherosclerosis (ICD10-170.0) Electronically Signed   By: Evangeline Dakin M.D.   On: 05/16/2018 18:57   Dg Chest 2 View Result Date: 05/16/2018 CLINICAL DATA:  Cough for 6 weeks. Weakness. History of cancer of stomach and small intestines. EXAM: CHEST - 2 VIEW COMPARISON:  12/02/2016 FINDINGS: Normal heart size and pulmonary vascularity. No focal airspace disease or consolidation in the lungs. No blunting of costophrenic angles. No pneumothorax. Mediastinal contours appear intact. Degenerative changes in the spine. IMPRESSION: No active cardiopulmonary disease.  Electronically Signed   By: Lucienne Capers M.D.   On: 05/16/2018 19:01   Ct Head Wo Contrast Result Date: 05/16/2018 CLINICAL DATA:  Lightheadedness. Lightheadedness. Muscle weakness. Also complaining of abdominal pain and dysuria for a week. EXAM: CT HEAD WITHOUT CONTRAST TECHNIQUE: Contiguous axial images were obtained from the base of the skull through the vertex without intravenous contrast. COMPARISON:  None. FINDINGS: Brain: No evidence of acute infarction, hemorrhage, hydrocephalus, extra-axial collection or mass lesion/mass effect. There is mild ventricular and sulcal enlargement reflecting age-appropriate volume loss. Vascular: No hyperdense vessel or unexpected calcification. Skull: Normal. Negative for fracture or focal lesion. Sinuses/Orbits: Globes and orbits are unremarkable. The visualized sinuses and mastoid air cells are clear. Other: None. IMPRESSION: 1. No acute intracranial abnormalities. Electronically Signed   By: Lajean Manes M.D.   On: 05/16/2018 18:42    2045:  Pt has tol PO well while in the ED without N/V.  No stooling while in the ED.  Abd benign, resps easy, VSS. Feels better and wants to go home now. Pt not orthostatic on VS. Pt now states he does have "a funny feeling" when he urinates; denies testicular pain/swelling, no perineal pain. Possible UTI on Udip; will tx with cipro while UC pending. Dx and testing d/w pt and family.  Questions answered.  Verb understanding, agreeable to d/c  home with outpt f/u.     Final Clinical Impressions(s) / ED Diagnoses   Final diagnoses:  None    ED Discharge Orders    None       Francine Graven, DO 05/21/18 1115

## 2018-05-16 NOTE — ED Triage Notes (Signed)
Patient complains of abdominal and dysuria x 1 week.

## 2018-05-16 NOTE — Discharge Instructions (Addendum)
Take the prescription as directed.  Increase your fluid intake (ie:  Gatoraide) for the next few days, as discussed.  Eat a bland diet and advance to your regular diet slowly as you can tolerate it.   Avoid full strength juices, as well as milk and milk products until your diarrhea has resolved.   Call your regular medical doctor Monday to schedule a follow up appointment in the next 3 days.  Call the Urologist on Monday to schedule a follow up appointment within the next week. Return to the Emergency Department immediately sooner if worsening.

## 2018-05-16 NOTE — ED Notes (Signed)
Pt given gingerale and crackers 

## 2018-05-16 NOTE — ED Notes (Signed)
Pt to xray. NT will do orthostatics and EKG upon arrival back

## 2018-05-19 LAB — URINE CULTURE: Culture: >=100000 — AB

## 2018-05-20 ENCOUNTER — Telehealth: Payer: Self-pay | Admitting: *Deleted

## 2018-05-20 ENCOUNTER — Ambulatory Visit (INDEPENDENT_AMBULATORY_CARE_PROVIDER_SITE_OTHER): Payer: Medicare PPO | Admitting: Urology

## 2018-05-20 DIAGNOSIS — R351 Nocturia: Secondary | ICD-10-CM | POA: Diagnosis not present

## 2018-05-20 DIAGNOSIS — R972 Elevated prostate specific antigen [PSA]: Secondary | ICD-10-CM | POA: Diagnosis not present

## 2018-05-20 NOTE — Telephone Encounter (Signed)
Post ED Visit - Positive Culture Follow-up  Culture report reviewed by antimicrobial stewardship pharmacist:  []  Elenor Quinones, Pharm.D. []  Heide Guile, Pharm.D., BCPS AQ-ID []  Parks Neptune, Pharm.D., BCPS []  Alycia Rossetti, Pharm.D., BCPS []  [place], [place].D., BCPS, AAHIVP []  Legrand Como, Pharm.D., BCPS, AAHIVP []  Salome Arnt, PharmD, BCPS []  Johnnette Gourd, PharmD, BCPS []  Hughes Better, PharmD, BCPS []  Leeroy Cha, PharmD  Positive urine culture, reviewed by Cassie Freer, PharmD Treated with Ciprofloxacin HCL organism sensitive to the same and no further patient follow-up is required at this time.  Harlon Flor [facility] 05/20/2018, 9:17 AM

## 2018-07-12 ENCOUNTER — Other Ambulatory Visit: Payer: Self-pay

## 2018-07-12 ENCOUNTER — Emergency Department (HOSPITAL_COMMUNITY)
Admission: EM | Admit: 2018-07-12 | Discharge: 2018-07-12 | Disposition: A | Discharge status: Home / Self Care | Payer: Medicare PPO | Attending: Emergency Medicine | Admitting: Emergency Medicine

## 2018-07-12 ENCOUNTER — Encounter (HOSPITAL_COMMUNITY): Payer: Self-pay

## 2018-07-12 DIAGNOSIS — I10 Essential (primary) hypertension: Secondary | ICD-10-CM | POA: Diagnosis not present

## 2018-07-12 DIAGNOSIS — R55 Syncope and collapse: Secondary | ICD-10-CM | POA: Insufficient documentation

## 2018-07-12 DIAGNOSIS — R1013 Epigastric pain: Secondary | ICD-10-CM | POA: Insufficient documentation

## 2018-07-12 DIAGNOSIS — R0689 Other abnormalities of breathing: Secondary | ICD-10-CM | POA: Diagnosis not present

## 2018-07-12 DIAGNOSIS — E119 Type 2 diabetes mellitus without complications: Secondary | ICD-10-CM | POA: Insufficient documentation

## 2018-07-12 DIAGNOSIS — R5381 Other malaise: Secondary | ICD-10-CM | POA: Insufficient documentation

## 2018-07-12 LAB — CBC
HCT: 41.4 % (ref 39.0–52.0)
Hemoglobin: 13.4 g/dL (ref 13.0–17.0)
MCH: 28.1 pg (ref 26.0–34.0)
MCHC: 32.4 g/dL (ref 30.0–36.0)
MCV: 86.8 fL (ref 80.0–100.0)
Platelets: 191 10*3/uL (ref 150–400)
RBC: 4.77 MIL/uL (ref 4.22–5.81)
RDW: 14.6 % (ref 11.5–15.5)
WBC: 8.9 10*3/uL (ref 4.0–10.5)
nRBC: 0 % (ref 0.0–0.2)

## 2018-07-12 LAB — I-STAT TROPONIN, ED: Troponin i, poc: 0.01 ng/mL (ref 0.00–0.08)

## 2018-07-12 LAB — URINALYSIS, ROUTINE W REFLEX MICROSCOPIC
Bilirubin Urine: NEGATIVE
Glucose, UA: NEGATIVE mg/dL
Hgb urine dipstick: NEGATIVE
Ketones, ur: NEGATIVE mg/dL
Leukocytes,Ua: NEGATIVE
Nitrite: NEGATIVE
Protein, ur: NEGATIVE mg/dL
Specific Gravity, Urine: 1.012 (ref 1.005–1.030)
pH: 5 (ref 5.0–8.0)

## 2018-07-12 LAB — BASIC METABOLIC PANEL
Anion gap: 11 (ref 5–15)
BUN: 21 mg/dL (ref 8–23)
CO2: 24 mmol/L (ref 22–32)
Calcium: 8.8 mg/dL — ABNORMAL LOW (ref 8.9–10.3)
Chloride: 104 mmol/L (ref 98–111)
Creatinine, Ser: 1.29 mg/dL — ABNORMAL HIGH (ref 0.61–1.24)
GFR calc Af Amer: >60 mL/min (ref >60)
GFR calc non Af Amer: 54 mL/min — ABNORMAL LOW (ref >60)
Glucose, Bld: 148 mg/dL — ABNORMAL HIGH (ref 70–99)
Potassium: 3.5 mmol/L (ref 3.5–5.1)
Sodium: 139 mmol/L (ref 135–145)

## 2018-07-12 LAB — CBG MONITORING, ED: Glucose-Capillary: 134 mg/dL — ABNORMAL HIGH (ref 70–99)

## 2018-07-12 MED ORDER — SODIUM CHLORIDE 0.9 % IV BOLUS
1000.0000 mL | Freq: Once | INTRAVENOUS | Status: AC
  Administered 2018-07-12: 1000 mL via INTRAVENOUS

## 2018-07-12 MED ORDER — SODIUM CHLORIDE 0.9% FLUSH: 3.0000 mL | Freq: Once | INTRAVENOUS | Status: DC

## 2018-07-12 NOTE — ED Triage Notes (Addendum)
Pt brought to ED via [facility] after feeling dizzy at church and feeling like he was going to pass out. Pt states he vomited x 1. CBG 191

## 2018-07-12 NOTE — Discharge Instructions (Addendum)
You were evaluated in the Emergency Department and after careful evaluation, we did not find any emergent condition requiring admission or further testing in the hospital.  Your symptoms today seem to be due to a fainting spell related to low blood pressure, which can happen sometimes when we suddenly change positions.  As discussed, try to increase your water intake and discuss your symptoms with your regular doctor.  Please return to the Emergency Department if you experience any worsening of your condition.  We encourage you to follow up with a primary care provider.  Thank you for allowing [place] to be a part of your care.

## 2018-07-12 NOTE — ED Provider Notes (Signed)
[facility] Emergency Department Provider Note MRN:  [id number]  [place] date & time: 07/12/18     Chief Complaint   Dizziness   History of Present Illness   Donald Klein is a 73 y.o. year-old male with a history of hypertension, diabetes presenting to the ED with chief complaint of dizziness.  Patient endorsing epigastric discomfort this morning at 3 AM, took antacid.  After that was feeling well, feeling normal self, went to church.  At 11 AM while at church, suddenly felt general malaise, vision became foggy, felt very lightheaded, and then had a syncopal episode.  Witnesses state he was unconscious for a few seconds.  Quick return to baseline.  Immediately prior to this event, patient had sat down from a standing position.  Nausea and 2 episodes of nonbloody nonbilious emesis upon wakening.  Denies any chest pain or shortness of breath during the event, no abdominal pain recently or during the event, no numbness or weakness to the arms or legs, no headache or vision change.  Currently feels his normal self.  EMS reports low blood pressure on arrival in the low 40J systolic.  Glucose 190.  Review of Systems  A complete 10 system review of systems was obtained and all systems are negative except as noted in the HPI and PMH.   Patient's Health History    Past Medical History:  Diagnosis Date  . Anxiety   . Barrett esophagus    Last EGD 05/27/08 Barrett's without dyplasia. EGD due 05/2011  . Chronic back pain   . Colitis    In 1980s, dx with UC by Dr. Humphrey Rolls at time of TCS, 1992 TCS, chronic colitis  . Depression   . Diabetes mellitus   . GERD (gastroesophageal reflux disease)   . Helicobacter pylori gastritis 2009   treated  . HTN (hypertension)   . PTSD (post-traumatic stress disorder)   . 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 Tubular adenoma of colon 10/08/10   Last colonosocpy 1 cecal TA    Past  Surgical History:  Procedure Laterality Date  . APPENDECTOMY  July 2014   at time of small bowel resection  . BIOPSY  01/06/2018   Procedure: BIOPSY;  Surgeon: Daneil Dolin, MD;  [facility];  Service: Endoscopy;;  gastric biopsy , esophageal biopsy  . CHOLECYSTECTOMY    . COLONOSCOPY  10/08/2010   Normal rectum/tubular adenoma/normal random biopsy. Next TCS due 10/2015.  Marland Kitchen COLONOSCOPY N/A 09/30/2014   Dr. Gala Romney: Redunant colon . Status post segmental biopsy and stool sampling. Negative stool samples and colonic biopsies.   . ESOPHAGOGASTRODUODENOSCOPY  07/31/2011   Dr. Doyce Para segment Barrett's esophagus s/p bx Hiatal hernia. Duodenal diverticulum. No dysplasia.Next EGD 07/2014.  Marland Kitchen ESOPHAGOGASTRODUODENOSCOPY N/A 09/30/2014   Dr. Gala Romney: Abnormal distal esophagus consistant with prior diagnosis of short segment Barrett's esophagus status post biopsy. 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 Hiatal hernia. Abnormal Gastric mucosa of uncertain significance status post gastric biopsy. path with +Barrett's esophagus but no dysplasia, mild chronic gastritis. Surveillance for Barrett's due in 2019  . ESOPHAGOGASTRODUODENOSCOPY N/A 01/06/2018   Procedure: ESOPHAGOGASTRODUODENOSCOPY (EGD);  Surgeon: Daneil Dolin, MD;  Location: [facility];  Service: Endoscopy;  Laterality: N/A;  12:00pm  . EXPLORATORY LAPAROTOMY W/ BOWEL RESECTION  July 2014   [facility]: with lysis of adhesions, small bowel resection X 2, resection of mesenteric mass, appendectomy, path: fibromatosis   .  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 Heart disease Mother   . Suicidality Mother   . Heart attack Father        88  . Heart disease  Father   . Heart disease Sister   . Colon cancer Neg Hx     Social History   Socioeconomic History  . Marital status: Married    Spouse name: Not on file  . Number of children: 1  . Years of education: Not on file  . Highest education level: Not on file  Occupational History  . Occupation: disability  Social Needs  . Financial resource strain: Not on file  . Food insecurity:    Worry: Not on file    Inability: Not on file  . Transportation needs:    Medical: Not on file    Non-medical: Not on file  Tobacco Use  . Smoking status: Never Smoker  . Smokeless tobacco: Never Used  Substance and Sexual Activity  . Alcohol use: No    Alcohol/week: 0.0 standard drinks  . Drug use: No  . Sexual activity: Yes    Partners: Female    Birth control/protection: None    Comment: spouse  Lifestyle  . Physical activity:    Days per week: Not on file    Minutes per session: Not on file  . Stress: Not on file  Relationships  . Social connections:    Talks on phone: Not on file    Gets together: Not on file    Attends religious service: Not on file    Active member of club or organization: Not on file    Attends meetings of clubs or organizations: Not on file    Relationship status: Not on file  . Intimate partner violence:    Fear of current or ex partner: Not on file    Emotionally abused: Not on file    Physically abused: Not on file    Forced sexual activity: Not on file  Other Topics Concern  . Not on file  Social History Narrative  . Not on file     Physical Exam  Vital Signs and Nursing Notes reviewed Vitals:   07/12/18 1300 07/12/18 1330  BP: (!) 151/107 (!) 149/77  Pulse: 89 86  Resp: 16   Temp:    SpO2: 100% 100%    CONSTITUTIONAL: Well-appearing, NAD NEURO:  Alert and oriented x 3, normal and symmetric strength and sensation, normal coordination, no facial droop, no nystagmus EYES:  eyes equal and reactive ENT/NECK:  no LAD, no JVD CARDIO: Regular rate,  well-perfused, normal S1 and S2 PULM:  CTAB no wheezing or rhonchi GI/GU:  normal bowel sounds, non-distended, non-tender MSK/SPINE:  No gross deformities, no edema SKIN:  no rash, atraumatic PSYCH:  Appropriate speech and behavior  Diagnostic and Interventional Summary    EKG Interpretation  Date/Time:  Sunday July 12 2018 11:58:20 EDT Ventricular Rate:  79 PR Interval:    QRS Duration: 88 QT Interval:  393 QTC Calculation: 451 R Axis:   -16 Text Interpretation:  Sinus rhythm Borderline left axis deviation Borderline T wave abnormalities Baseline wander in lead(s) V3 Confirmed by Gerlene Fee [phone]) on 07/12/2018 12:15:01 PM  Labs Reviewed  BASIC METABOLIC PANEL - Abnormal; Notable for the following components:      Result Value   Glucose, Bld 148 (*)    Creatinine, Ser 1.29 (*)    Calcium 8.8 (*)    GFR calc non Af Amer 54 (*)    All other components within normal limits  CBG MONITORING, ED - Abnormal; Notable for the following components:   Glucose-Capillary 134 (*)    All other components within normal limits  CBC  URINALYSIS, ROUTINE W REFLEX MICROSCOPIC  I-STAT TROPONIN, ED    No orders to display    Medications  sodium chloride flush (NS) 0.9 % injection 3 mL (3 mLs Intravenous Not Given 07/12/18 1244)  sodium chloride 0.9 % bolus 1,000 mL (0 mLs Intravenous Stopped 07/12/18 1346)     Procedures Critical Care  ED Course and Medical Decision Making  I have reviewed the triage vital signs and the nursing notes.  Pertinent labs & imaging results that were available during my care of the patient were reviewed by me and considered in my medical decision making (see below for details).  Favoring orthostatic/postural hypotension in this 73 year old male with history of the same, not clearly identified in the records but patient does report having a catheterization of his heart a few years ago revealing some mild CAD, takes baby aspirin.  Otherwise has no  significant cardiac history.  EKG is without concerning features.  Given the epigastric pain earlier this morning, will evaluate with troponin, basic labs.  Labs are reassuring, troponin negative.  Patient has been asymptomatic during his entire ED visit.  No indication for further testing or admission.  Patient explains that he also feels lightheaded when he wakes up and gets out of bed or after taking his medications.  Advised more water at home, PCP follow-up.  After the discussed management above, the patient was determined to be safe for discharge.  The patient was in agreement with this plan and all questions regarding their care were answered.  ED return precautions were discussed and the patient will return to the ED with any significant worsening of condition.  Barth Kirks. [place] Small, [place] [email or web address]  Final Clinical Impressions(s) / ED Diagnoses     ICD-10-CM   1. Syncope, unspecified syncope type R55     ED Discharge Orders    None         Maudie Flakes, MD 07/12/18 1438

## 2018-10-06 ENCOUNTER — Other Ambulatory Visit: Payer: Self-pay

## 2018-10-06 ENCOUNTER — Encounter: Payer: Self-pay | Admitting: Internal Medicine

## 2018-10-06 ENCOUNTER — Ambulatory Visit (INDEPENDENT_AMBULATORY_CARE_PROVIDER_SITE_OTHER): Payer: Medicare PPO | Admitting: Internal Medicine

## 2018-10-06 VITALS — BP 93/59 | HR 91 | Temp 97.2°F | Ht 69.0 in | Wt 154.2 lb

## 2018-10-06 DIAGNOSIS — C49A2 Gastrointestinal stromal tumor of stomach: Secondary | ICD-10-CM

## 2018-10-06 DIAGNOSIS — R6881 Early satiety: Secondary | ICD-10-CM

## 2018-10-06 DIAGNOSIS — R634 Abnormal weight loss: Secondary | ICD-10-CM

## 2018-10-06 DIAGNOSIS — K921 Melena: Secondary | ICD-10-CM

## 2018-10-06 DIAGNOSIS — L290 Pruritus ani: Secondary | ICD-10-CM

## 2018-10-06 NOTE — Patient Instructions (Signed)
Patient to get labs at [facility].  I need to get a copy of them as they become available (needs C. difficile results they are doing tomorrow as well)  Plan for a diagnostic colonoscopy and an EGD (propofol), rectal bleeding, Hemoccult positive stool, early satiety, weight loss, nausea and vomiting history of recurrent gist tumor  OTC miconazole cream for pruritus the anal area  Continue omeprazole 20 mg twice daily for GERD  Patient is to stop Gleevec 2 days before EGD and colonoscopy  Stop iron 5 days prior to colonoscopy  Further recommendations to follow.

## 2018-10-06 NOTE — Progress Notes (Addendum)
Received labs from [facility].  10/07/2018: White count 4.9 H&H 12.2 35.5.  MCV 83.7 platelet count 173.  Lites are good creatinine 1.51 glucose 127 bilirubin 0.5 alkaline phosphatase 56 AST 32 ALT 47 C. difficile toxin assay negative.

## 2018-10-06 NOTE — Progress Notes (Signed)
Primary Care Physician:  [name], MD Primary Gastroenterologist:  Dr.   Pre-Procedure History & Physical: HPI:  Donald Klein is a 73 y.o. male here for here for follow-up of blood per rectum and rectal itching.  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  Currently taking Gleevec and followed by Drs. Merrie Roof and Clovis Riley at [facility].  Patient has history of chronic diarrhea.  Notes intermittent blood per rectum bowel movements recently.  Rather severe itching -   anal canal; Negative colonoscopy 2016 (history of colonic adenoma). H. pylori gastritis previously treated. States about a 15 pound weight loss over the past several months.  Lack of appetite.  Occasional postprandial nausea and vomiting.  No odynophagia or dysphagia. History of short segment Barrett's esophagus GERD well-controlled-takes omeprazole 20 mg twice daily   Past Medical History:  Diagnosis Date   Anxiety    Barrett esophagus    Last EGD 05/27/08 Barrett's without dyplasia. EGD due 05/2011   Chronic back pain    Colitis    In 1980s, dx with UC by Dr. 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 Next  TCS due 10/2015.   COLONOSCOPY N/A 09/30/2014   Dr. Gala Romney: Redunant colon . Status post segmental biopsy and stool sampling. Negative stool samples and colonic biopsies.    ESOPHAGOGASTRODUODENOSCOPY  07/31/2011   Dr. Doyce Para segment Barrett's esophagus s/p bx Hiatal hernia. Duodenal diverticulum. No dysplasia.Next EGD 07/2014.   ESOPHAGOGASTRODUODENOSCOPY N/A 09/30/2014   Dr. Gala Romney: Abnormal distal esophagus consistant with prior diagnosis of short segment Barrett's esophagus status post biopsy. 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 Hiatal hernia. Abnormal Gastric mucosa of uncertain significance status post gastric biopsy. path with +Barrett's esophagus but no dysplasia, mild chronic gastritis. 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 Authorizing Provider  amLODipine (NORVASC) 10 MG tablet Take 10 mg by mouth at bedtime.  11/25/10  Yes [provider]  Artificial Tear Ointment (DRY EYES  OP) Apply 1 drop to eye daily as needed (dry eyes).   Yes [provider]  aspirin EC 81 MG tablet Take 81 mg by mouth daily.   Yes [provider]  cholecalciferol (VITAMIN D) 25 MCG (1000 UT) tablet Take 1 tablet by mouth daily. 06/11/18  Yes [provider]    CRESTOR 20 MG tablet Take 1 tablet by mouth every morning.  08/05/10  Yes [provider]  ferrous sulfate 325 (65 FE) MG tablet Take 325 mg by mouth daily with breakfast.   Yes [provider]  gabapentin (NEURONTIN) 300 MG capsule Take 1 capsule by mouth at bedtime.    Yes [provider]  imatinib (GLEEVEC) 400 MG tablet Take 1 tablet by mouth daily. 09/21/18  Yes [provider]  JANUMET 50-1000 MG tablet Take 1 tablet by mouth 2 (two) times daily with a meal.  11/24/17  Yes [provider]  losartan (COZAAR) 100 MG tablet Take 50 mg by mouth daily.    Yes [provider]  LOVAZA 1 G capsule Take 1 capsule by mouth 2 (two) times daily.  08/05/10  Yes [provider]  magnesium oxide (MAG-OX) 400 MG tablet Take 400 mg by mouth daily.   Yes [provider]  metoprolol (LOPRESSOR) 50 MG tablet Take 50 mg by mouth Twice daily.  07/31/10  Yes [provider]  Multiple Vitamins-Minerals (MULTIVITAMIN WITH MINERALS) tablet Take 1 tablet by mouth daily.   Yes [provider]  nitroGLYCERIN (NITROSTAT) 0.4 MG SL tablet Place 0.4 mg under the tongue every 5 (five) minutes as needed for chest pain.   Yes [provider]  omeprazole (PRILOSEC) 20 MG capsule Take 20 mg by mouth 2 (two) times daily before a meal.   Yes [provider]  OZEMPIC, 0.25 OR 0.5 MG/DOSE, 2 MG/1.5ML SOPN Takes it every Tuesday 02/10/18  Yes [provider]  tamsulosin (FLOMAX) 0.4 MG CAPS capsule Take 0.4 mg by mouth every evening.   Yes [provider]  vitamin C (ASCORBIC ACID) 250 MG tablet Take 250 mg by mouth daily.   Yes [provider]    Allergies as of 10/06/2018 - Review Complete 10/06/2018  Allergen Reaction Noted   Lisinopril Other (See Comments) 12/29/2017   Lovenox [enoxaparin sodium] Hives and Rash 09/30/2014   Penicillins Rash     Family History  Problem Relation Age of Onset    Heart disease Mother    Suicidality Mother    Heart attack Father        66   Heart disease Father    Heart disease Sister    Colon cancer Neg Hx     Social History   Socioeconomic History   Marital status: Married    Spouse name: Not on file   Number of children: 1   Years of education: Not on file   Highest education level: Not on file  Occupational History   Occupation: disability  Social Designer, fashion/clothing strain: Not on file   Food insecurity:    Worry: Not on file    Inability: Not on file   Transportation needs:    Medical: Not on file    Non-medical: Not on file  Tobacco Use   Smoking status: Never Smoker   Smokeless tobacco: Never Used  Substance and Sexual Activity   Alcohol use: No    Alcohol/week: 0.0 standard drinks   Drug use: No   Sexual activity:  Yes    Partners: Female    Birth control/protection: None    Comment: spouse  Lifestyle   Physical activity:    Days per week: Not on file    Minutes per session: Not on file   Stress: Not on file  Relationships   Social connections:    Talks on phone: Not on file    Gets together: Not on file    Attends religious service: Not on file    Active member of club or organization: Not on file    Attends meetings of clubs or organizations: Not on file    Relationship status: Not on file   Intimate partner violence:    Fear of current or ex partner: Not on file    Emotionally abused: Not on file    Physically abused: Not on file    Forced sexual activity: Not on file  Other Topics Concern   Not on file  Social History Narrative   Not on file    Review of Systems: See HPI, otherwise negative ROS  Physical Exam: BP (!) 93/59    Pulse 91    Temp (!) 97.2 F (36.2 C) (Oral)    Ht 5\' 9"  (1.753 m)    Wt 154 lb 3.2 oz (69.9 kg)    BMI 22.77 kg/m  General:   Alert,   pleasant and cooperative in NAD; appears a little younger than the stated chronological age Mouth:  No  deformity or lesions. Neck:  Supple; no masses or thyromegaly. No significant cervical adenopathy. Lungs:  Clear throughout to auscultation.   No wheezes, crackles, or rhonchi. No acute distress. Heart:  Regular rate and rhythm; no murmurs, clicks, rubs,  or gallops. Abdomen: Non-distended, normal bowel sounds.  Soft and nontender without appreciable mass or hepatosplenomegaly.  Rectal: No external lesions aside from a little perianal redness digital exam revealed no mass.  Nontender.  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  Recent symptoms of early satiety, Weight loss, postprandial nausea and vomiting. Acute on chronic diarrhea.  Ongoing therapy with metformin may be contributing to his loose stools.  Intermittent rectal bleeding.  Hemoccult positive today.  It is been about 4 years since his last colonoscopy.  Given his comorbidities and time elapsed since last evaluation, we ought to go ahead and offer him a colonoscopy. The risks, benefits, limitations, imponderables and alternatives regarding both EGD and colonoscopy have been reviewed with the patient. Questions have been answered. All parties agreeable.     I have discussed my impression and recommendations with Dr. Merrie Roof, his [facility] oncologist, via telephone this afternoon.  She is in agreement.  We will review the labs to be obtained at [facility].  C. difficile also being done. Assuming labs are satisfactory, we will proceed with an EGD and a colonoscopy in the near future.  Will hold Gleevec x2 days prior to his endoscopic evaluation.   Given his upper GI tract symptoms, would also perform an EGD at the same time.    Notice: This dictation was prepared with Dragon dictation along with smaller phrase technology. Any transcriptional errors that result from this process are unintentional and may not be corrected upon review.

## 2018-10-07 ENCOUNTER — Telehealth: Payer: Self-pay | Admitting: *Deleted

## 2018-10-07 ENCOUNTER — Other Ambulatory Visit: Payer: Self-pay | Admitting: *Deleted

## 2018-10-07 DIAGNOSIS — C49A2 Gastrointestinal stromal tumor of stomach: Secondary | ICD-10-CM

## 2018-10-07 DIAGNOSIS — R112 Nausea with vomiting, unspecified: Secondary | ICD-10-CM

## 2018-10-07 DIAGNOSIS — K921 Melena: Secondary | ICD-10-CM

## 2018-10-07 DIAGNOSIS — R634 Abnormal weight loss: Secondary | ICD-10-CM

## 2018-10-07 DIAGNOSIS — R6881 Early satiety: Secondary | ICD-10-CM

## 2018-10-07 DIAGNOSIS — R195 Other fecal abnormalities: Secondary | ICD-10-CM

## 2018-10-07 MED ORDER — PEG 3350-KCL-NA BICARB-NACL 420 G PO SOLR: 4000.0000 mL | Freq: Once | ORAL | 0 refills | Status: AC

## 2018-10-07 NOTE — Telephone Encounter (Signed)
Pre-op appt mailed with instructions 

## 2018-10-07 NOTE — Telephone Encounter (Signed)
Spoke with patient. He is scheduled for TCS/EGD with propofol with RMR on 6/25 at 12pm. Patient aware will mail pre-op appt with instructions (confirmed mailing address). Request rx to be sent to [facility]. Orders entered  PA done via [facility] website for EGD. Josem Kaufmann #[id number] Dates 10/29/2018-11/28/2018

## 2018-10-20 NOTE — Patient Instructions (Signed)
Donald Klein  10/20/2018     @PREFPERIOPPHARMACY @   Your procedure is scheduled on  10/29/2018  Report to [facility] at  45  A.M.  Call this number if you have problems the morning of surgery:  [phone]   Remember:  Do not eat or drink after midnight.                        Take these medicines the morning of surgery with A SIP OF WATER  Gabapenrin, gleevac, metoprolol, prilosec, flomax.    Do not wear jewelry, make-up or nail polish.  Do not wear lotions, powders, or perfumes, or deodorant.  Do not shave 48 hours prior to surgery.  Men may shave face and neck.  Do not bring valuables to the hospital.  [facility] is not responsible for any belongings or valuables.  Contacts, dentures or bridgework may not be worn into surgery.  Leave your suitcase in the car.  After surgery it may be brought to your room.  For patients admitted to the hospital, discharge time will be determined by your treatment team.  Patients discharged the day of surgery will not be allowed to drive home.   Name and phone number of your driver:   family Special instructions:  None  Please read over the following fact sheets that you were given. Anesthesia Post-op Instructions and Care and Recovery After Surgery       Upper Endoscopy, Adult, Care After This sheet gives you information about how to care for yourself after your procedure. Your health care provider may also give you more specific instructions. If you have problems or questions, contact your health care provider. What can I expect after the procedure? After the procedure, it is common to have:  A sore throat.  Mild stomach pain or discomfort.  Bloating.  Nausea. Follow these instructions at home:   Follow instructions from your health care provider about what to eat or drink after your procedure.  Return to your normal activities as told by your health care provider. Ask your health care provider what  activities are safe for you.  Take over-the-counter and prescription medicines only as told by your health care provider.  Do not drive for 24 hours if you were given a sedative during your procedure.  Keep all follow-up visits as told by your health care provider. This is important. Contact a health care provider if you have:  A sore throat that lasts longer than one day.  Trouble swallowing. Get help right away if:  You vomit blood or your vomit looks like coffee grounds.  You have: ? A fever. ? Bloody, black, or tarry stools. ? A severe sore throat or you cannot swallow. ? Difficulty breathing. ? Severe pain in your chest or abdomen. Summary  After the procedure, it is common to have a sore throat, mild stomach discomfort, bloating, and nausea.  Do not drive for 24 hours if you were given a sedative during the procedure.  Follow instructions from your health care provider about what to eat or drink after your procedure.  Return to your normal activities as told by your health care provider. This information is not intended to replace advice given to you by your health care provider. Make sure you discuss any questions you have with your health care provider. Document Released: 10/22/2011 Document Revised: 09/22/2017 Document Reviewed: 09/22/2017 Elsevier Interactive Patient Education  2019  [place].  Colonoscopy, Adult, Care After This sheet gives you information about how to care for yourself after your procedure. Your health care provider may also give you more specific instructions. If you have problems or questions, contact your health care provider. What can I expect after the procedure? After the procedure, it is common to have:  A small amount of blood in your stool for 24 hours after the procedure.  Some gas.  Mild abdominal cramping or bloating. Follow these instructions at home: General instructions  For the first 24 hours after the procedure: ? Do  not drive or use machinery. ? Do not sign important documents. ? Do not drink alcohol. ? Do your regular daily activities at a slower pace than normal. ? Eat soft, easy-to-digest foods.  Take over-the-counter or prescription medicines only as told by your health care provider. Relieving cramping and bloating   Try walking around when you have cramps or feel bloated.  Apply heat to your abdomen as told by your health care provider. Use a heat source that your health care provider recommends, such as a moist heat pack or a heating pad. ? Place a towel between your skin and the heat source. ? Leave the heat on for 20-30 minutes. ? Remove the heat if your skin turns bright red. This is especially important if you are unable to feel pain, heat, or cold. You may have a greater risk of getting burned. Eating and drinking   Drink enough fluid to keep your urine pale yellow.  Resume your normal diet as instructed by your health care provider. Avoid heavy or fried foods that are hard to digest.  Avoid drinking alcohol for as long as instructed by your health care provider. Contact a health care provider if:  You have blood in your stool 2-3 days after the procedure. Get help right away if:  You have more than a small spotting of blood in your stool.  You pass large blood clots in your stool.  Your abdomen is swollen.  You have nausea or vomiting.  You have a fever.  You have increasing abdominal pain that is not relieved with medicine. Summary  After the procedure, it is common to have a small amount of blood in your stool. You may also have mild abdominal cramping and bloating.  For the first 24 hours after the procedure, do not drive or use machinery, sign important documents, or drink alcohol.  Contact your health care provider if you have a lot of blood in your stool, nausea or vomiting, a fever, or increased abdominal pain. This information is not intended to replace advice  given to you by your health care provider. Make sure you discuss any questions you have with your health care provider. Document Released: 12/05/2003 Document Revised: 02/12/2017 Document Reviewed: 07/04/2015 Elsevier Interactive Patient Education  2019 [place], Care After These instructions provide you with information about caring for yourself after your procedure. Your health care provider may also give you more specific instructions. Your treatment has been planned according to current medical practices, but problems sometimes occur. Call your health care provider if you have any problems or questions after your procedure. What can I expect after the procedure? After your procedure, you may:  Feel sleepy for several hours.  Feel clumsy and have poor balance for several hours.  Feel forgetful about what happened after the procedure.  Have poor judgment for several hours.  Feel nauseous or vomit.  Have a sore throat if you had a breathing tube during the procedure. Follow these instructions at home: For at least 24 hours after the procedure:      Have a responsible adult stay with you. It is important to have someone help care for you until you are awake and alert.  Rest as needed.  Do not: ? Participate in activities in which you could fall or become injured. ? Drive. ? Use heavy machinery. ? Drink alcohol. ? Take sleeping pills or medicines that cause drowsiness. ? Make important decisions or sign legal documents. ? Take care of children on your own. Eating and drinking  Follow the diet that is recommended by your health care provider.  If you vomit, drink water, juice, or soup when you can drink without vomiting.  Make sure you have little or no nausea before eating solid foods. General instructions  Take over-the-counter and prescription medicines only as told by your health care provider.  If you have sleep apnea, surgery and  certain medicines can increase your risk for breathing problems. Follow instructions from your health care provider about wearing your sleep device: ? Anytime you are sleeping, including during daytime naps. ? While taking prescription pain medicines, sleeping medicines, or medicines that make you drowsy.  If you smoke, do not smoke without supervision.  Keep all follow-up visits as told by your health care provider. This is important. Contact a health care provider if:  You keep feeling nauseous or you keep vomiting.  You feel light-headed.  You develop a rash.  You have a fever. Get help right away if:  You have trouble breathing. Summary  For several hours after your procedure, you may feel sleepy and have poor judgment.  Have a responsible adult stay with you for at least 24 hours or until you are awake and alert. This information is not intended to replace advice given to you by your health care provider. Make sure you discuss any questions you have with your health care provider. Document Released: 08/13/2015 Document Revised: 12/06/2016 Document Reviewed: 08/13/2015 Elsevier Interactive Patient Education  2019 [employer].

## 2018-10-26 ENCOUNTER — Other Ambulatory Visit: Payer: Self-pay

## 2018-10-26 ENCOUNTER — Other Ambulatory Visit (HOSPITAL_COMMUNITY)
Admission: RE | Admit: 2018-10-26 | Discharge: 2018-10-26 | Disposition: A | Discharge status: Home / Self Care | Payer: Medicare PPO | Source: Ambulatory Visit | Attending: Internal Medicine | Admitting: Internal Medicine

## 2018-10-26 ENCOUNTER — Encounter (HOSPITAL_COMMUNITY): Payer: Self-pay

## 2018-10-26 ENCOUNTER — Encounter (HOSPITAL_COMMUNITY)
Admission: RE | Admit: 2018-10-26 | Discharge: 2018-10-26 | Disposition: A | Discharge status: Home / Self Care | Payer: Medicare PPO | Source: Ambulatory Visit | Attending: Internal Medicine | Admitting: Internal Medicine

## 2018-10-26 DIAGNOSIS — E119 Type 2 diabetes mellitus without complications: Secondary | ICD-10-CM | POA: Diagnosis not present

## 2018-10-26 DIAGNOSIS — Z1159 Encounter for screening for other viral diseases: Secondary | ICD-10-CM | POA: Diagnosis not present

## 2018-10-26 DIAGNOSIS — C49A3 Gastrointestinal stromal tumor of small intestine: Secondary | ICD-10-CM | POA: Diagnosis not present

## 2018-10-26 DIAGNOSIS — R197 Diarrhea, unspecified: Secondary | ICD-10-CM | POA: Diagnosis not present

## 2018-10-26 DIAGNOSIS — K56699 Other intestinal obstruction unspecified as to partial versus complete obstruction: Secondary | ICD-10-CM | POA: Diagnosis not present

## 2018-10-26 DIAGNOSIS — K219 Gastro-esophageal reflux disease without esophagitis: Secondary | ICD-10-CM | POA: Diagnosis not present

## 2018-10-26 DIAGNOSIS — D649 Anemia, unspecified: Secondary | ICD-10-CM | POA: Diagnosis not present

## 2018-10-26 DIAGNOSIS — G4730 Sleep apnea, unspecified: Secondary | ICD-10-CM | POA: Diagnosis not present

## 2018-10-26 DIAGNOSIS — Z7982 Long term (current) use of aspirin: Secondary | ICD-10-CM | POA: Diagnosis not present

## 2018-10-26 DIAGNOSIS — K2950 Unspecified chronic gastritis without bleeding: Secondary | ICD-10-CM | POA: Diagnosis not present

## 2018-10-26 DIAGNOSIS — I10 Essential (primary) hypertension: Secondary | ICD-10-CM | POA: Diagnosis not present

## 2018-10-26 DIAGNOSIS — R195 Other fecal abnormalities: Secondary | ICD-10-CM | POA: Diagnosis not present

## 2018-10-26 DIAGNOSIS — Z888 Allergy status to other drugs, medicaments and biological substances status: Secondary | ICD-10-CM | POA: Diagnosis not present

## 2018-10-26 DIAGNOSIS — Z79899 Other long term (current) drug therapy: Secondary | ICD-10-CM | POA: Diagnosis not present

## 2018-10-26 DIAGNOSIS — C49A2 Gastrointestinal stromal tumor of stomach: Secondary | ICD-10-CM | POA: Diagnosis not present

## 2018-10-26 DIAGNOSIS — Z7984 Long term (current) use of oral hypoglycemic drugs: Secondary | ICD-10-CM | POA: Diagnosis not present

## 2018-10-26 DIAGNOSIS — Z880 Allergy status to penicillin: Secondary | ICD-10-CM | POA: Diagnosis not present

## 2018-10-26 DIAGNOSIS — K319 Disease of stomach and duodenum, unspecified: Secondary | ICD-10-CM | POA: Diagnosis not present

## 2018-10-26 DIAGNOSIS — Z8619 Personal history of other infectious and parasitic diseases: Secondary | ICD-10-CM | POA: Diagnosis not present

## 2018-10-26 DIAGNOSIS — Z86010 Personal history of colonic polyps: Secondary | ICD-10-CM | POA: Diagnosis not present

## 2018-10-26 DIAGNOSIS — Z01812 Encounter for preprocedural laboratory examination: Secondary | ICD-10-CM | POA: Insufficient documentation

## 2018-10-26 DIAGNOSIS — K449 Diaphragmatic hernia without obstruction or gangrene: Secondary | ICD-10-CM | POA: Diagnosis not present

## 2018-10-26 HISTORY — DX: Anemia, unspecified: D64.9

## 2018-10-26 HISTORY — DX: Sleep apnea, unspecified: G47.30

## 2018-10-26 LAB — BASIC METABOLIC PANEL
Anion gap: 10 (ref 5–15)
BUN: 13 mg/dL (ref 8–23)
CO2: 26 mmol/L (ref 22–32)
Calcium: 8.6 mg/dL — ABNORMAL LOW (ref 8.9–10.3)
Chloride: 106 mmol/L (ref 98–111)
Creatinine, Ser: 1.14 mg/dL (ref 0.61–1.24)
GFR calc Af Amer: >60 mL/min (ref >60)
GFR calc non Af Amer: >60 mL/min (ref >60)
Glucose, Bld: 136 mg/dL — ABNORMAL HIGH (ref 70–99)
Potassium: 3.2 mmol/L — ABNORMAL LOW (ref 3.5–5.1)
Sodium: 142 mmol/L (ref 135–145)

## 2018-10-26 MED ORDER — POTASSIUM CHLORIDE CRYS ER 20 MEQ PO TBCR: 20.0000 meq | EXTENDED_RELEASE_TABLET | Freq: Every day | ORAL | 0 refills | Status: DC

## 2018-10-27 ENCOUNTER — Telehealth: Payer: Self-pay | Admitting: Internal Medicine

## 2018-10-27 ENCOUNTER — Encounter: Payer: Self-pay | Admitting: Internal Medicine

## 2018-10-27 LAB — NOVEL CORONAVIRUS, NAA (HOSP ORDER, SEND-OUT TO REF LAB; TAT 18-24 HRS): SARS-CoV-2, NAA: NOT DETECTED

## 2018-10-28 NOTE — Telephone Encounter (Signed)
done

## 2018-10-29 ENCOUNTER — Ambulatory Visit (HOSPITAL_COMMUNITY): Payer: Medicare PPO | Admitting: Anesthesiology

## 2018-10-29 ENCOUNTER — Encounter (HOSPITAL_COMMUNITY): Payer: Self-pay

## 2018-10-29 ENCOUNTER — Other Ambulatory Visit: Payer: Self-pay

## 2018-10-29 ENCOUNTER — Ambulatory Visit (HOSPITAL_COMMUNITY)
Admission: RE | Admit: 2018-10-29 | Discharge: 2018-10-29 | Disposition: A | Discharge status: Home / Self Care | Payer: Medicare PPO | Attending: Internal Medicine | Admitting: Internal Medicine

## 2018-10-29 ENCOUNTER — Encounter (HOSPITAL_COMMUNITY)
Admission: RE | Disposition: A | Discharge status: Home / Self Care | Payer: Self-pay | Source: Home / Self Care | Attending: Internal Medicine

## 2018-10-29 DIAGNOSIS — R197 Diarrhea, unspecified: Secondary | ICD-10-CM | POA: Insufficient documentation

## 2018-10-29 DIAGNOSIS — R195 Other fecal abnormalities: Secondary | ICD-10-CM

## 2018-10-29 DIAGNOSIS — K2270 Barrett's esophagus without dysplasia: Secondary | ICD-10-CM | POA: Diagnosis not present

## 2018-10-29 DIAGNOSIS — Z8619 Personal history of other infectious and parasitic diseases: Secondary | ICD-10-CM | POA: Insufficient documentation

## 2018-10-29 DIAGNOSIS — Z86010 Personal history of colonic polyps: Secondary | ICD-10-CM | POA: Insufficient documentation

## 2018-10-29 DIAGNOSIS — R112 Nausea with vomiting, unspecified: Secondary | ICD-10-CM

## 2018-10-29 DIAGNOSIS — Z1159 Encounter for screening for other viral diseases: Secondary | ICD-10-CM | POA: Insufficient documentation

## 2018-10-29 DIAGNOSIS — K449 Diaphragmatic hernia without obstruction or gangrene: Secondary | ICD-10-CM | POA: Insufficient documentation

## 2018-10-29 DIAGNOSIS — Z888 Allergy status to other drugs, medicaments and biological substances status: Secondary | ICD-10-CM | POA: Insufficient documentation

## 2018-10-29 DIAGNOSIS — K56699 Other intestinal obstruction unspecified as to partial versus complete obstruction: Secondary | ICD-10-CM | POA: Insufficient documentation

## 2018-10-29 DIAGNOSIS — K219 Gastro-esophageal reflux disease without esophagitis: Secondary | ICD-10-CM | POA: Insufficient documentation

## 2018-10-29 DIAGNOSIS — Z79899 Other long term (current) drug therapy: Secondary | ICD-10-CM | POA: Insufficient documentation

## 2018-10-29 DIAGNOSIS — D649 Anemia, unspecified: Secondary | ICD-10-CM | POA: Insufficient documentation

## 2018-10-29 DIAGNOSIS — Z7982 Long term (current) use of aspirin: Secondary | ICD-10-CM | POA: Insufficient documentation

## 2018-10-29 DIAGNOSIS — K921 Melena: Secondary | ICD-10-CM

## 2018-10-29 DIAGNOSIS — R634 Abnormal weight loss: Secondary | ICD-10-CM

## 2018-10-29 DIAGNOSIS — K319 Disease of stomach and duodenum, unspecified: Secondary | ICD-10-CM | POA: Insufficient documentation

## 2018-10-29 DIAGNOSIS — C49A2 Gastrointestinal stromal tumor of stomach: Secondary | ICD-10-CM | POA: Insufficient documentation

## 2018-10-29 DIAGNOSIS — G4730 Sleep apnea, unspecified: Secondary | ICD-10-CM | POA: Insufficient documentation

## 2018-10-29 DIAGNOSIS — I10 Essential (primary) hypertension: Secondary | ICD-10-CM | POA: Insufficient documentation

## 2018-10-29 DIAGNOSIS — Z7984 Long term (current) use of oral hypoglycemic drugs: Secondary | ICD-10-CM | POA: Insufficient documentation

## 2018-10-29 DIAGNOSIS — Z880 Allergy status to penicillin: Secondary | ICD-10-CM | POA: Insufficient documentation

## 2018-10-29 DIAGNOSIS — C49A3 Gastrointestinal stromal tumor of small intestine: Secondary | ICD-10-CM | POA: Insufficient documentation

## 2018-10-29 DIAGNOSIS — K2950 Unspecified chronic gastritis without bleeding: Secondary | ICD-10-CM | POA: Insufficient documentation

## 2018-10-29 DIAGNOSIS — R6881 Early satiety: Secondary | ICD-10-CM

## 2018-10-29 DIAGNOSIS — E119 Type 2 diabetes mellitus without complications: Secondary | ICD-10-CM | POA: Insufficient documentation

## 2018-10-29 HISTORY — PX: ESOPHAGOGASTRODUODENOSCOPY (EGD) WITH PROPOFOL: SHX5813

## 2018-10-29 HISTORY — PX: COLONOSCOPY WITH PROPOFOL: SHX5780

## 2018-10-29 HISTORY — PX: BIOPSY: SHX5522

## 2018-10-29 LAB — GLUCOSE, CAPILLARY: Glucose-Capillary: 81 mg/dL (ref 70–99)

## 2018-10-29 SURGERY — COLONOSCOPY WITH PROPOFOL
Anesthesia: Monitor Anesthesia Care

## 2018-10-29 MED ORDER — KETAMINE HCL 10 MG/ML IJ SOLN
INTRAMUSCULAR | Status: DC | PRN
  Administered 2018-10-29 (×2): 10 mg via INTRAVENOUS

## 2018-10-29 MED ORDER — CHLORHEXIDINE GLUCONATE CLOTH 2 % EX PADS: 6.0000 | MEDICATED_PAD | Freq: Once | CUTANEOUS | Status: DC

## 2018-10-29 MED ORDER — LACTATED RINGERS IV SOLN
INTRAVENOUS | Status: DC
  Administered 2018-10-29: 12:00:00 via INTRAVENOUS

## 2018-10-29 MED ORDER — KETAMINE HCL 50 MG/5ML IJ SOSY
PREFILLED_SYRINGE | INTRAMUSCULAR | Status: AC
  Filled 2018-10-29: qty 5

## 2018-10-29 MED ORDER — LACTATED RINGERS IV SOLN: INTRAVENOUS | Status: DC

## 2018-10-29 MED ORDER — MEPERIDINE HCL 50 MG/ML IJ SOLN: 6.2500 mg | INTRAMUSCULAR | Status: DC | PRN

## 2018-10-29 MED ORDER — PROPOFOL 10 MG/ML IV BOLUS
INTRAVENOUS | Status: AC
  Filled 2018-10-29: qty 20

## 2018-10-29 MED ORDER — PROPOFOL 10 MG/ML IV BOLUS
INTRAVENOUS | Status: DC | PRN
  Administered 2018-10-29: 20 mg via INTRAVENOUS

## 2018-10-29 MED ORDER — PROMETHAZINE HCL 25 MG/ML IJ SOLN: 6.2500 mg | INTRAMUSCULAR | Status: DC | PRN

## 2018-10-29 MED ORDER — LIDOCAINE HCL (CARDIAC) PF 100 MG/5ML IV SOSY
PREFILLED_SYRINGE | INTRAVENOUS | Status: DC | PRN
  Administered 2018-10-29: 40 mg via INTRAVENOUS

## 2018-10-29 MED ORDER — HYDROCODONE-ACETAMINOPHEN 7.5-325 MG PO TABS: 1.0000 | ORAL_TABLET | Freq: Once | ORAL | Status: DC | PRN

## 2018-10-29 MED ORDER — GLYCOPYRROLATE 0.2 MG/ML IJ SOLN
INTRAMUSCULAR | Status: DC | PRN
  Administered 2018-10-29: 0.2 mg via INTRAVENOUS

## 2018-10-29 MED ORDER — PROPOFOL 500 MG/50ML IV EMUL
INTRAVENOUS | Status: DC | PRN
  Administered 2018-10-29: 150 ug/kg/min via INTRAVENOUS
  Administered 2018-10-29 (×2): via INTRAVENOUS

## 2018-10-29 MED ORDER — PROPOFOL 10 MG/ML IV BOLUS
INTRAVENOUS | Status: AC
  Filled 2018-10-29: qty 60

## 2018-10-29 MED ORDER — HYDROMORPHONE HCL 1 MG/ML IJ SOLN: 0.2500 mg | INTRAMUSCULAR | Status: DC | PRN

## 2018-10-29 NOTE — Op Note (Addendum)
[facility] Patient Name: Donald Klein Procedure Date: 10/29/2018 12:00 PM MRN: [id number] Date of Birth: [birth year] Attending MD: Norvel Richards , MD CSN: [id number] Age: 73 Admit Type: Outpatient Procedure:                Upper GI endoscopy Indications:              Heme positive stool Providers:                Norvel Richards, MD, Charlsie Quest. Theda Sers RN, RN,                            Raphael Gibney, Technician Referring MD:              Medicines:                Propofol per Anesthesia Complications:            No immediate complications. Estimated Blood Loss:     Estimated blood loss was minimal. Procedure:                Pre-Anesthesia Assessment:                           - Prior to the procedure, a History and Physical                            was performed, and patient medications and                            allergies were reviewed. The patient's tolerance of                            previous anesthesia was also reviewed. The risks                            and benefits of the procedure and the sedation                            options and risks were discussed with the patient.                            All questions were answered, and informed consent                            was obtained. Prior Anticoagulants: The patient has                            taken no previous anticoagulant or antiplatelet                            agents. ASA Grade Assessment: II - A patient with                            mild systemic disease. After reviewing the risks  and benefits, the patient was deemed in                            satisfactory condition to undergo the procedure.                           After obtaining informed consent, the endoscope was                            passed under direct vision. Throughout the                            procedure, the patient's blood pressure, pulse, and                            oxygen  saturations were monitored continuously. The                            GIF-H190 ([id number]) scope was introduced through the                            mouth, and advanced to the second part of duodenum.                            The upper GI endoscopy was accomplished without                            difficulty. The patient tolerated the procedure                            well. The patient tolerated the procedure well. Scope In: 12:23:35 PM Scope Out: 12:33:05 PM Total Procedure Duration: 0 hours 9 minutes 30 seconds  Findings:      Short segment salmon-colored epithelium approximately 2 cm above the GE       junction. Confluent distribution. No esophagitis. No nodularity.       Biopsies taken.      Mottled gastric mucosa with some deformity. No obvious infiltrating       process. A small hiatal hernia was present.      The duodenal bulb and second portion of the duodenum were normal.       Biopsies of the abnormal gastric mucosa taken. Impression:               -Abnormal esophagus consistent with short segment                            Barrett's esophagus - status post biopsy.                           - Small hiatal hernia. Deformed stomach likely                            related to prior surgery. Normal mucosa of  uncertain significance?status post biopsy                           - Normal duodenal bulb and second portion of the                            duodenum.                           - No specimens collected. Moderate Sedation:      Moderate (conscious) sedation was personally administered by an       anesthesia professional. The following parameters were monitored: oxygen       saturation, heart rate, blood pressure, respiratory rate, EKG, adequacy       of pulmonary ventilation, and response to care. Recommendation:           - Patient has a contact number available for                            emergencies. The signs and symptoms of  potential                            delayed complications were discussed with the                            patient. Return to normal activities tomorrow.                            Written discharge instructions were provided to the                            patient.                           - Advance diet as tolerated. Follow-up pathology.                            See colonoscopy report. Procedure Code(s):        --- Professional ---                           [phone], Esophagogastroduodenoscopy, flexible,                            transoral; diagnostic, including collection of                            specimen(s) by brushing or washing, when performed                            (separate procedure) Diagnosis Code(s):        --- Professional ---                           K22.70, Barrett's esophagus without dysplasia  K44.9, Diaphragmatic hernia without obstruction or                            gangrene                           R19.5, Other fecal abnormalities CPT copyright 2019 American Medical Association. All rights reserved. The codes documented in this report are preliminary and upon coder review may  be revised to meet current compliance requirements. Cristopher Estimable. Willella Harding, MD Norvel Richards, MD 10/29/2018 12:55:13 PM This report has been signed electronically. Number of Addenda: 0

## 2018-10-29 NOTE — Progress Notes (Signed)
No change; no dysphagia; potassium 3.2 supplemented with oral KCl.  Diarrhea has gotten better.  EGD and colonoscopy per plan.  The risks, benefits, limitations, imponderables and alternatives regarding both EGD and colonoscopy have been reviewed with the patient. Questions have been answered. All parties agreeable.

## 2018-10-29 NOTE — Anesthesia Procedure Notes (Signed)
Procedure Name: MAC Date/Time: 10/29/2018 12:15 PM Performed by: Andree Elk Amy A, CRNA Pre-anesthesia Checklist: Patient identified, Emergency Drugs available, Suction available, Timeout performed and Patient being monitored Patient Re-evaluated:Patient Re-evaluated prior to induction Oxygen Delivery Method: Non-rebreather mask

## 2018-10-29 NOTE — Op Note (Addendum)
[facility] Patient Name: Donald Klein Procedure Date: 10/29/2018 12:36 PM MRN: [id number] Date of Birth: 13-Jan-1946 Attending MD: Norvel Richards , MD CSN: [id number] Age: 73 Admit Type: Outpatient Procedure:                Colonoscopy Indications:              Heme positive stool; diarrhea?patient states the                            this symptom has resolved. C. difficile negative                            recently. Providers:                Norvel Richards, MD, Charlsie Quest Theda Sers RN, RN,                            Raphael Gibney, Technician Referring MD:              Medicines:                Propofol per Anesthesia Complications:            No immediate complications. Estimated Blood Loss:     Estimated blood loss was minimal. Procedure:                Pre-Anesthesia Assessment:                           - Prior to the procedure, a History and Physical                            was performed, and patient medications and                            allergies were reviewed. The patient's tolerance of                            previous anesthesia was also reviewed. The risks                            and benefits of the procedure and the sedation                            options and risks were discussed with the patient.                            All questions were answered, and informed consent                            was obtained. Prior Anticoagulants: The patient has                            taken no previous anticoagulant or antiplatelet                            agents.  ASA Grade Assessment: II - A patient with                            mild systemic disease. After reviewing the risks                            and benefits, the patient was deemed in                            satisfactory condition to undergo the procedure.                           After obtaining informed consent, the colonoscope                            was passed under  direct vision. Throughout the                            procedure, the patient's blood pressure, pulse, and                            oxygen saturations were monitored continuously. The                            CF-HQ190L ([id number]) scope was introduced through                            the anus and advanced to the the ascending colon.                            The PCF-H190DL ([id number]) scope was introduced                            through the anus and advanced to the. The                            colonoscopy was performed without difficulty. The                            patient tolerated the procedure well. The quality                            of the bowel preparation was adequate. Scope In: 12:40:43 PM Scope Out: 1:19:45 PM Total Procedure Duration: 0 hours 39 minutes 2 seconds  Findings:      The perianal and digital rectal examinations were normal. Markedly       redundant colon. External abdominal pressure required to reach the       ascending segment. In the vicinity of the hepatic flexure there was a       what appeared to be a benign surgical stricture which would initially       not advance the adult's colonoscope. I obtained the pediatric       colonoscope was able to get through this area only with mild resistance.  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 No concern for       malignancy at this level. There are still a good bit of upstream colon       which was surveyed. I came to acute flexion what appeared to be a blunt       and. I was not clearly able to identify the appendiceal orifice or the       ileocecal valve after an extensive survey. The mucosa otherwise appeared       normal. 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 Stricture appears to be                            noncritical. Landmarks of the cecum/ileocecal valve                            not confirmed  although the blunt end of the cecum                            appeared to have been reached. Moderate Sedation:      Moderate (conscious) sedation was personally administered by an       anesthesia professional. The following parameters were monitored: oxygen       saturation, heart rate, blood pressure, respiratory rate, EKG, adequacy       of pulmonary ventilation, and response to care. Recommendation:           - Patient has a contact number available for                            emergencies. The signs and symptoms of potential                            delayed complications were discussed with the                            patient. Return to normal activities tomorrow.                            Written discharge instructions were provided to the                            patient.                           -May or may not need a repeat attempted                            examination. We will need to review the prior                            operative note. Will consider an air-contrast  barium enema complement today's colonoscopy. See                            EGD report.                           - Return to GI office in 6 weeks. Procedure Code(s):        --- Professional ---                           [phone], Colonoscopy, flexible; diagnostic, including                            collection of specimen(s) by brushing or washing,                            when performed (separate procedure) Diagnosis Code(s):        --- Professional ---                           R19.5, Other fecal abnormalities CPT copyright 2019 American Medical Association. All rights reserved. The codes documented in this report are preliminary and upon coder review may  be revised to meet current compliance requirements. Cristopher Estimable. Jaydalyn Demattia, MD Norvel Richards, MD 10/29/2018 1:29:56 PM This report has been signed electronically. Number of Addenda: 0

## 2018-10-29 NOTE — Anesthesia Postprocedure Evaluation (Signed)
Anesthesia Post Note  Patient: Donald Klein  Procedure(s) Performed: COLONOSCOPY WITH PROPOFOL (N/A ) ESOPHAGOGASTRODUODENOSCOPY (EGD) WITH PROPOFOL (N/A ) BIOPSY  Patient location during evaluation: PACU Anesthesia Type: MAC Level of consciousness: awake and alert and oriented Pain management: pain level controlled Respiratory status: spontaneous breathing Cardiovascular status: stable Postop Assessment: no apparent nausea or vomiting Anesthetic complications: no     Last Vitals:  Vitals:   10/29/18 1330 10/29/18 1341  BP: 134/66 140/78  Pulse: 89 82  Resp: 17 18  Temp: 36.7 C   SpO2: 100% 96%    Last Pain:  Vitals:   10/29/18 1341  TempSrc: Oral  PainSc: 0-No pain                 Danijela Vessey A

## 2018-10-29 NOTE — Discharge Instructions (Signed)
Colonoscopy Discharge Instructions  Read the instructions outlined below and refer to this sheet in the next few weeks. These discharge instructions provide you with general information on caring for yourself after you leave the hospital. Your doctor may also give you specific instructions. While your treatment has been planned according to the most current medical practices available, unavoidable complications occasionally occur. If you have any problems or questions after discharge, call Dr. Gala Romney at [phone]. ACTIVITY  You may resume your regular activity, but move at a slower pace for the next 24 hours.   Take frequent rest periods for the next 24 hours.   Walking will help get rid of the air and reduce the bloated feeling in your belly (abdomen).   No driving for 24 hours (because of the medicine (anesthesia) used during the test).    Do not sign any important legal documents or operate any machinery for 24 hours (because of the anesthesia used during the test).  NUTRITION  Drink plenty of fluids.   You may resume your normal diet as instructed by your doctor.   Begin with a light meal and progress to your normal diet. Heavy or fried foods are harder to digest and may make you feel sick to your stomach (nauseated).   Avoid alcoholic beverages for 24 hours or as instructed.  MEDICATIONS  You may resume your normal medications unless your doctor tells you otherwise.  WHAT YOU CAN EXPECT TODAY  Some feelings of bloating in the abdomen.   Passage of more gas than usual.   Spotting of blood in your stool or on the toilet paper.  IF YOU HAD POLYPS REMOVED DURING THE COLONOSCOPY:  No aspirin products for 7 days or as instructed.   No alcohol for 7 days or as instructed.   Eat a soft diet for the next 24 hours.  FINDING OUT THE RESULTS OF YOUR TEST Not all test results are available during your visit. If your test results are not back during the visit, make an appointment  with your caregiver to find out the results. Do not assume everything is normal if you have not heard from your caregiver or the medical facility. It is important for you to follow up on all of your test results.  SEEK IMMEDIATE MEDICAL ATTENTION IF:  You have more than a spotting of blood in your stool.   Your belly is swollen (abdominal distention).   You are nauseated or vomiting.   You have a temperature over 101.   You have abdominal pain or discomfort that is severe or gets worse throughout the day.   Colonoscopy Discharge Instructions  Read the instructions outlined below and refer to this sheet in the next few weeks. These discharge instructions provide you with general information on caring for yourself after you leave the hospital. Your doctor may also give you specific instructions. While your treatment has been planned according to the most current medical practices available, unavoidable complications occasionally occur. If you have any problems or questions after discharge, call Dr. Gala Romney at [phone]. ACTIVITY  You may resume your regular activity, but move at a slower pace for the next 24 hours.   Take frequent rest periods for the next 24 hours.   Walking will help get rid of the air and reduce the bloated feeling in your belly (abdomen).   No driving for 24 hours (because of the medicine (anesthesia) used during the test).    Do not sign any important legal documents  or operate any machinery for 24 hours (because of the anesthesia used during the test).  NUTRITION  Drink plenty of fluids.   You may resume your normal diet as instructed by your doctor.   Begin with a light meal and progress to your normal diet. Heavy or fried foods are harder to digest and may make you feel sick to your stomach (nauseated).   Avoid alcoholic beverages for 24 hours or as instructed.  MEDICATIONS  You may resume your normal medications unless your doctor tells you otherwise.   WHAT YOU CAN EXPECT TODAY  Some feelings of bloating in the abdomen.   Passage of more gas than usual.   Spotting of blood in your stool or on the toilet paper.  IF YOU HAD POLYPS REMOVED DURING THE COLONOSCOPY:  No aspirin products for 7 days or as instructed.   No alcohol for 7 days or as instructed.   Eat a soft diet for the next 24 hours.  FINDING OUT THE RESULTS OF YOUR TEST Not all test results are available during your visit. If your test results are not back during the visit, make an appointment with your caregiver to find out the results. Do not assume everything is normal if you have not heard from your caregiver or the medical facility. It is important for you to follow up on all of your test results.  SEEK IMMEDIATE MEDICAL ATTENTION IF:  You have more than a spotting of blood in your stool.   Your belly is swollen (abdominal distention).   You are nauseated or vomiting.   You have a temperature over 101.   You have abdominal pain or discomfort that is severe or gets worse throughout the day.   Resume Gleevec today  Continue omeprazole twice daily  Follow-up on pathology.  Your colonoscopy was incomplete today.  You may or may not need a another examination  Further recommendations to follow.  I called Marcie Bal, patient's wife, at patient request-[phone].  Got no answer.       Monitored Anesthesia Care, Care After These instructions provide you with information about caring for yourself after your procedure. Your health care provider may also give you more specific instructions. Your treatment has been planned according to current medical practices, but problems sometimes occur. Call your health care provider if you have any problems or questions after your procedure. What can I expect after the procedure? After your procedure, you may:  Feel sleepy for several hours.  Feel clumsy and have poor balance for several hours.  Feel forgetful about what  happened after the procedure.  Have poor judgment for several hours.  Feel nauseous or vomit.  Have a sore throat if you had a breathing tube during the procedure. Follow these instructions at home: For at least 24 hours after the procedure:      Have a responsible adult stay with you. It is important to have someone help care for you until you are awake and alert.  Rest as needed.  Do not: ? Participate in activities in which you could fall or become injured. ? Drive. ? Use heavy machinery. ? Drink alcohol. ? Take sleeping pills or medicines that cause drowsiness. ? Make important decisions or sign legal documents. ? Take care of children on your own. Eating and drinking  Follow the diet that is recommended by your health care provider.  If you vomit, drink water, juice, or soup when you can drink without vomiting.  Make sure you  have little or no nausea before eating solid foods. General instructions  Take over-the-counter and prescription medicines only as told by your health care provider.  If you have sleep apnea, surgery and certain medicines can increase your risk for breathing problems. Follow instructions from your health care provider about wearing your sleep device: ? Anytime you are sleeping, including during daytime naps. ? While taking prescription pain medicines, sleeping medicines, or medicines that make you drowsy.  If you smoke, do not smoke without supervision.  Keep all follow-up visits as told by your health care provider. This is important. Contact a health care provider if:  You keep feeling nauseous or you keep vomiting.  You feel light-headed.  You develop a rash.  You have a fever. Get help right away if:  You have trouble breathing. Summary  For several hours after your procedure, you may feel sleepy and have poor judgment.  Have a responsible adult stay with you for at least 24 hours or until you are awake and alert. This  information is not intended to replace advice given to you by your health care provider. Make sure you discuss any questions you have with your health care provider. Document Released: 08/13/2015 Document Revised: 12/06/2016 Document Reviewed: 08/13/2015 Elsevier Interactive Patient Education  2019 [employer].

## 2018-10-29 NOTE — Anesthesia Preprocedure Evaluation (Addendum)
Anesthesia Evaluation    Airway Mallampati: III       Dental  (+) Poor Dentition   Pulmonary sleep apnea ,    breath sounds clear to auscultation       Cardiovascular hypertension,  Rhythm:regular     Neuro/Psych PSYCHIATRIC DISORDERS Anxiety Depression    GI/Hepatic GERD  ,  Endo/Other  diabetes, Type 2  Renal/GU      Musculoskeletal   Abdominal   Peds  Hematology  (+) Blood dyscrasia, anemia ,   Anesthesia Other Findings K 3.2 today  Reproductive/Obstetrics                             Anesthesia Physical Anesthesia Plan  ASA: III  Anesthesia Plan: MAC   Post-op Pain Management:    Induction:   PONV Risk Score and Plan:   Airway Management Planned:   Additional Equipment:   Intra-op Plan:   Post-operative Plan:   Informed Consent: I have reviewed the patients History and Physical, chart, labs and discussed the procedure including the risks, benefits and alternatives for the proposed anesthesia with the patient or authorized representative who has indicated his/her understanding and acceptance.       Plan Discussed with: Anesthesiologist  Anesthesia Plan Comments:         Anesthesia Quick Evaluation

## 2018-10-29 NOTE — Progress Notes (Signed)
Return call received from wife. "I am on my way, will take about 83minutes to get there".  Patient ambulating to bathroom with assistance without difficulty.

## 2018-10-29 NOTE — Progress Notes (Signed)
Multiple attempts to reach wife at cell number [phone], no response, message left that husband dressed and ready to go home.  Call to home number with no answer.

## 2018-10-29 NOTE — Transfer of Care (Signed)
Immediate Anesthesia Transfer of Care Note  Patient: Donald Klein  Procedure(s) Performed: COLONOSCOPY WITH PROPOFOL (N/A ) ESOPHAGOGASTRODUODENOSCOPY (EGD) WITH PROPOFOL (N/A ) BIOPSY  Patient Location: PACU  Anesthesia Type:MAC  Level of Consciousness: awake, alert , oriented and patient cooperative  Airway & Oxygen Therapy: Patient Spontanous Breathing  Post-op Assessment: Report given to RN and Post -op Vital signs reviewed and stable  Post vital signs: Reviewed and stable  Last Vitals:  Vitals Value Taken Time  BP    Temp    Pulse 89 10/29/18 1330  Resp 17 10/29/18 1330  SpO2 100 % 10/29/18 1330  Vitals shown include unvalidated device data.  Last Pain:  Vitals:   10/29/18 1216  TempSrc:   PainSc: 0-No pain         Complications: No apparent anesthesia complications

## 2018-10-30 ENCOUNTER — Encounter: Payer: Self-pay | Admitting: Internal Medicine

## 2018-11-02 ENCOUNTER — Telehealth: Payer: Self-pay | Admitting: Internal Medicine

## 2018-11-02 NOTE — Telephone Encounter (Signed)
Spoke with pt and he was given his EGD report over the phone. Pt is aware that his result was mailed this morning to him. TCS results aren't available, pt is aware that they will be mailed as well when received.

## 2018-11-02 NOTE — Telephone Encounter (Signed)
Please call patient about his procedure

## 2018-11-05 ENCOUNTER — Encounter (HOSPITAL_COMMUNITY): Payer: Self-pay | Admitting: Internal Medicine

## 2018-11-11 ENCOUNTER — Telehealth: Payer: Self-pay

## 2018-11-11 ENCOUNTER — Other Ambulatory Visit: Payer: Self-pay

## 2018-11-11 ENCOUNTER — Ambulatory Visit (INDEPENDENT_AMBULATORY_CARE_PROVIDER_SITE_OTHER): Payer: Medicare PPO | Admitting: Urology

## 2018-11-11 DIAGNOSIS — R351 Nocturia: Secondary | ICD-10-CM | POA: Diagnosis not present

## 2018-11-11 DIAGNOSIS — R972 Elevated prostate specific antigen [PSA]: Secondary | ICD-10-CM

## 2018-11-11 NOTE — Telephone Encounter (Signed)
Pt came by office, he wants to know result from TCS. He received result letter from EGD. Please call him [phone].

## 2018-11-12 NOTE — Telephone Encounter (Signed)
Pt notified of results. F/u apt 12/17/18.

## 2018-11-12 NOTE — Telephone Encounter (Signed)
He had a stricture in his colon;  Looked benign.  TCS in complete.  Needs OV w extender to decide about an ACBE and any further evaluation.

## 2018-11-12 NOTE — Telephone Encounter (Signed)
Pt received his letter with results from his EGD. Pt is inquiring about his TCS results.

## 2018-11-16 DIAGNOSIS — C269 Malignant neoplasm of ill-defined sites within the digestive system: Secondary | ICD-10-CM | POA: Insufficient documentation

## 2018-12-17 ENCOUNTER — Telehealth: Payer: Self-pay | Admitting: *Deleted

## 2018-12-17 ENCOUNTER — Encounter: Payer: Self-pay | Admitting: Nurse Practitioner

## 2018-12-17 ENCOUNTER — Other Ambulatory Visit: Payer: Self-pay | Admitting: *Deleted

## 2018-12-17 ENCOUNTER — Ambulatory Visit (INDEPENDENT_AMBULATORY_CARE_PROVIDER_SITE_OTHER): Payer: Medicare PPO | Admitting: Nurse Practitioner

## 2018-12-17 ENCOUNTER — Other Ambulatory Visit: Payer: Self-pay

## 2018-12-17 VITALS — BP 139/70 | HR 84 | Temp 97.7°F | Ht 69.0 in | Wt 153.2 lb

## 2018-12-17 DIAGNOSIS — L290 Pruritus ani: Secondary | ICD-10-CM

## 2018-12-17 DIAGNOSIS — K2270 Barrett's esophagus without dysplasia: Secondary | ICD-10-CM

## 2018-12-17 DIAGNOSIS — R197 Diarrhea, unspecified: Secondary | ICD-10-CM

## 2018-12-17 MED ORDER — DICYCLOMINE HCL 10 MG PO CAPS: 10.0000 mg | ORAL_CAPSULE | Freq: Four times a day (QID) | ORAL | 3 refills | Status: DC | PRN

## 2018-12-17 MED ORDER — HYDROCORTISONE (PERIANAL) 2.5 % EX CREA: 1.0000 "application " | TOPICAL_CREAM | Freq: Two times a day (BID) | CUTANEOUS | 2 refills | Status: DC

## 2018-12-17 NOTE — Telephone Encounter (Signed)
PA was approved. Auth# [id number] Dates 12/17/2018-01/16/2019

## 2018-12-17 NOTE — Assessment & Plan Note (Signed)
History of Barrett's esophagus.  Most recent EGD and biopsies did not show Barrett's esophagus, although I recommend he continue to remain on his PPI.  Does have a history of GERD with no breakthrough on PPI.  Return for follow-up in 2 months.

## 2018-12-17 NOTE — Telephone Encounter (Signed)
Initiated PA via [employer] for virtual CT. PA pending. Tracking# [id number]

## 2018-12-17 NOTE — Progress Notes (Signed)
Referring Provider: [name], MD Primary Care Physician:  [name], MD Primary GI:  Dr. Gala Romney  Chief Complaint  Patient presents with  . Diarrhea    3-5 times per day, occas blood when he he wipes too much  . Anal Itching    HPI:   Donald Klein is a 73 y.o. male who presents for post procedure follow-up.  Last seen in our office 10/07/2018 for hematochezia, weight loss, early satiety, gist tumor in the stomach, pruritus ani.  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  Currently taking Gleevec and followed by Dr. Merrie Roof and Dr. Clovis Riley at [facility].  Notes intermittent bright red blood per rectum and severe itching.  Negative colonoscopy 2016.  H. pylori gastritis previously treated.  15 pound weight loss over the last several months with lack of appetite and occasional postprandial nausea and vomiting.  History of short segment Barrett's esophagus with GERD well-controlled on omeprazole 20 mg twice daily.  Noted acute on chronic diarrhea possibly Metformin effect.  Recommended colonoscopy and upper endoscopy.  Follow-up with labs from [facility] scheduled for tomorrow.  Labs received from [facility] dated 10/07/2018 with a hemoglobin 12.2, white count 4.9, platelet count 173.  LFTs normal.  C. difficile toxin assay negative.  EGD completed 10/29/2018 which found a normal esophagus consistent with short segment Barrett's status post biopsy, small hiatal hernia, deformed stomach likely due to prior surgical resections, normal mucosa status post biopsy, normal duodenum.  Surgical pathology found esophageal biopsies to be gastroesophageal mucosa with reactive and regenerative changes, no goblet cell metaplasia.  The gastric biopsies were found to be reactive gastritis without H. pylori.    Colonoscopy completed the same day found markedly redundant colon with benign-appearing colonic stricture in the vicinity of the hepatic flexure  that appears to be noncritical but would not allow passage of the adult colonoscope.  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  With the pediatric colonoscope and was able to get through with mild resistance with quite a bit of remaining colon upstream.  The colonoscope came to an acute flexion what appeared to be a blind and and not clearly able to identify appendiceal orifice or ileocecal valve after extensive survey.  Recommended possible repeat attempt examination after reviewing prior operative note and consider air-contrast barium enema to complement the colonoscopy.  Overall post procedure recommendations include no future EGD unless new symptoms develop, continue omeprazole indefinitely, follow-up in office to decide on air-contrast barium enema.  Today he states he's doing ok overall. He is still having frequent diarrhea, about 5-6 stools a day, loose stools like water. He is unable to keep in the suppository due to frequent loose stools. Has persistent rectal itching when having loose stools. Occasionally has sold stools and other symptoms such as toilet tissue hematochezia and itching resolves. Has had this diarrhea for years intermittently. Thinks it's possibly due to medications. Occasional mild abdominal cramping now and then, tends to resolve when he has a bowel movement. Denies overt hematochezia but does have scant toilet tissue hematochezia with excessive wiping. Denies melena, fever, chills. Has had subjective weight loss of about 15 lbs in a few months. Has poor appetite which he attributes to some of his medications. Denies URI or flu-like symptoms. Denies loss of sense of taste or smell. Denies chest pain, dyspnea, dizziness, lightheadedness, syncope, near syncope. Denies any other upper or lower GI symptoms.  Objectively  he is within a pound in the last 2 months. He has lost 20 lbs in the past year. Has tried Imodium, doesn't take it "like I should" and  states it did help his diarrhea a bit.  Past Medical History:  Diagnosis Date  . Anemia   . Anxiety   . Barrett esophagus    Last EGD 05/27/08 Barrett's without dyplasia. EGD due 05/2011  . Chronic back pain   . Colitis    In 1980s, dx with UC by Dr. Humphrey Rolls at time of TCS, 1992 TCS, chronic colitis  . Depression   . Diabetes mellitus   . GERD (gastroesophageal reflux disease)   . Helicobacter pylori gastritis 2009   treated  . HTN (hypertension)   . PTSD (post-traumatic stress disorder)   . Sleep apnea   . 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 Tubular adenoma of colon 10/08/10   Last colonosocpy 1 cecal TA    Past Surgical History:  Procedure Laterality Date  . APPENDECTOMY  July 2014   at time of small bowel resection  . BIOPSY  01/06/2018   Procedure: BIOPSY;  Surgeon: Daneil Dolin, MD;  [facility];  Service: Endoscopy;;  gastric biopsy , esophageal biopsy  . BIOPSY  10/29/2018   Procedure: BIOPSY;  Surgeon: Daneil Dolin, MD;  Location: [facility];  Service: Endoscopy;;  gastric, esophageal   . CHOLECYSTECTOMY    . COLONOSCOPY  10/08/2010   Normal rectum/tubular adenoma/normal random biopsy. Next TCS due 10/2015.  Marland Kitchen COLONOSCOPY N/A 09/30/2014   Dr. Gala Romney: Redunant colon . Status post segmental biopsy and stool sampling. Negative stool samples and colonic biopsies.   . COLONOSCOPY WITH PROPOFOL N/A 10/29/2018   Procedure: COLONOSCOPY WITH PROPOFOL;  Surgeon: Daneil Dolin, MD;  Location: [facility];  Service: Endoscopy;  Laterality: N/A;  12:00pm  . ESOPHAGOGASTRODUODENOSCOPY  07/31/2011   Dr. Doyce Para segment Barrett's esophagus s/p bx Hiatal hernia. Duodenal diverticulum. No dysplasia.Next EGD 07/2014.  Marland Kitchen ESOPHAGOGASTRODUODENOSCOPY N/A 09/30/2014   Dr. Gala Romney: Abnormal distal esophagus consistant with prior diagnosis of short segment Barrett's esophagus status post biopsy. 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 Hiatal hernia. Abnormal Gastric mucosa of uncertain significance status post gastric biopsy. path with +Barrett's esophagus but no dysplasia, mild chronic gastritis. Surveillance for Barrett's due in 2019  . ESOPHAGOGASTRODUODENOSCOPY N/A 01/06/2018   Procedure: ESOPHAGOGASTRODUODENOSCOPY (EGD);  Surgeon: Daneil Dolin, MD;  [facility]: [facility];  Service: Endoscopy;  Laterality: N/A;  12:00pm  . ESOPHAGOGASTRODUODENOSCOPY (EGD) WITH PROPOFOL N/A 10/29/2018   Procedure: ESOPHAGOGASTRODUODENOSCOPY (EGD) WITH PROPOFOL;  Surgeon: Daneil Dolin, MD;  Location: [facility];  Service: Endoscopy;  Laterality: N/A;  . EXPLORATORY LAPAROTOMY W/ BOWEL RESECTION  July 2014   [facility]: with lysis of adhesions, small bowel resection X 2, resection of mesenteric mass, appendectomy, path: fibromatosis   . 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 Artificial Tear Ointment (DRY EYES OP) Place 1 drop into both eyes 2 (two) times daily as needed (dry eyes).     Marland Kitchen aspirin EC 81 MG tablet Take 81 mg by mouth daily.    Marland Kitchen  cholecalciferol (VITAMIN D) 25 MCG (1000 UT) tablet Take 1,000 Units by mouth daily.     . CRESTOR 20 MG tablet Take 10 mg by mouth every morning.     . diazepam (VALIUM) 5 MG tablet Take 5 mg by mouth daily as needed for anxiety.    . ferrous sulfate 325 (65 FE) MG tablet Take 325 mg by mouth daily with breakfast.    . imatinib (GLEEVEC) 400 MG tablet Take 400 mg by mouth daily.     Marland Kitchen JANUMET 50-1000 MG tablet Take 1 tablet by mouth 2 (two) times daily with a meal.   12  . LOVAZA 1 G capsule Take 1 capsule by mouth 2 (two) times daily.     . metoprolol (LOPRESSOR) 50 MG tablet  Take 25 mg by mouth daily.     . nitroGLYCERIN (NITROSTAT) 0.4 MG SL tablet Place 0.4 mg under the tongue every 5 (five) minutes as needed for chest pain.    Marland Kitchen omeprazole (PRILOSEC) 20 MG capsule Take 20 mg by mouth 2 (two) times daily before a meal.    . QUEtiapine (SEROQUEL) 50 MG tablet Take 50 mg by mouth at bedtime as needed for depression.    . sertraline (ZOLOFT) 100 MG tablet Take 100 mg by mouth daily as needed (PTSD).     Marland Kitchen tamsulosin (FLOMAX) 0.4 MG CAPS capsule Take 0.4 mg by mouth at bedtime.     . vitamin C (ASCORBIC ACID) 250 MG tablet Take 250 mg by mouth daily.    . potassium chloride SA (K-DUR) 20 MEQ tablet Take 1 tablet (20 mEq total) by mouth daily for 5 days. (Patient not taking: Reported on 12/17/2018) 5 tablet 0   No current facility-administered medications for this visit.     Allergies as of 12/17/2018 - Review Complete 12/17/2018  Allergen Reaction Noted  . Lisinopril Other (See Comments) 12/29/2017  . Lovenox [enoxaparin sodium] Hives and Rash 09/30/2014  . Penicillins Rash     Family History  Problem Relation Age of Onset  . Heart disease Mother   . Suicidality Mother   . Heart attack Father        60  . Heart disease Father   . Heart disease Sister   . Colon cancer Neg Hx     Social History   Socioeconomic History  . Marital status: Married    Spouse name: Not on file  . Number of children: 1  . Years of education: Not on file  . Highest education level: Not on file  Occupational History  . Occupation: disability  Social Needs  . Financial resource strain: Not on file  . Food insecurity    Worry: Not on file    Inability: Not on file  . Transportation needs    Medical: Not on file    Non-medical: Not on file  Tobacco Use  . Smoking status: Never Smoker  . Smokeless tobacco: Never Used  Substance and Sexual Activity  . Alcohol use: No    Alcohol/week: 0.0 standard drinks  . Drug use: No  . Sexual activity: Yes    Partners: Female     Birth control/protection: None    Comment: spouse  Lifestyle  . Physical activity    Days per week: Not on file    Minutes per session: Not on file  . Stress: Not on file  Relationships  . Social Herbalist on phone: Not on file    Gets together:  Not on file    Attends religious service: Not on file    Active member of club or organization: Not on file    Attends meetings of clubs or organizations: Not on file    Relationship status: Not on file  Other Topics Concern  . Not on file  Social History Narrative  . Not on file    Review of Systems: General: Negative for anorexia, weight loss, fever, chills, fatigue, weakness. ENT: Negative for hoarseness, difficulty swallowing. CV: Negative for chest pain, angina, palpitations, peripheral edema.  Respiratory: Negative for dyspnea at rest, cough, sputum, wheezing.  GI: See history of present illness. Endo: Negative for unusual weight change.  Heme: Negative for bruising or bleeding. Allergy: Negative for rash or hives.   Physical Exam: BP 139/70   Pulse 84   Temp 97.7 F (36.5 C) (Oral)   Ht 5\' 9"  (1.753 m)   Wt 153 lb 3.2 oz (69.5 kg)   BMI 22.62 kg/m  General:   Alert and oriented. Pleasant and cooperative. Well-nourished and well-developed.  Eyes:  Without icterus, sclera clear and conjunctiva pink.  Ears:  Normal auditory acuity. Cardiovascular:  S1, S2 present without murmurs appreciated. Extremities without clubbing or edema. Respiratory:  Clear to auscultation bilaterally. No wheezes, rales, or rhonchi. No distress.  Gastrointestinal:  +BS, soft, non-tender and non-distended. No HSM noted. No guarding or rebound. No masses appreciated.  Rectal:  Deferred  Musculoskalatal:  Symmetrical without gross deformities. Neurologic:  Alert and oriented x4;  grossly normal neurologically. Psych:  Alert and cooperative. Normal mood and affect. Heme/Lymph/Immune: No excessive bruising noted.    12/17/2018 8:42 AM    Disclaimer: This note was dictated with voice recognition software. Similar sounding words can inadvertently be transcribed and may not be corrected upon review.

## 2018-12-17 NOTE — Patient Instructions (Signed)
Your health issues we discussed today were:   Need for "virtual colonoscopy": 1. As we discussed, because your colonoscopy could not definitely find the end of your colon we will do a special CT exam to fully evaluate 2. Radiology will help with scheduling  Diarrhea: 1. I sent a prescription for Bentyl 10 mg to your pharmacy.  You can take this up to 4 times a day as needed for diarrhea 2. You can also use Imodium as needed to help with your diarrhea 3. Call us in 2 weeks and let us know if it is helping her diarrhea  Rectal itching: 1. I sent a prescription for Anusol rectal cream to your pharmacy 2. You can apply this up to twice a day for up to 10 days at a time for itching  Overall I recommend:  1. Continue your other current medications 2. Return for follow-up in 2 months 3. Call us if you have any questions or concerns.   Because of recent events of COVID-19 ("Coronavirus"), follow CDC recommendations:  1. Wash your hand frequently 2. Avoid touching your face 3. Stay away from people who are sick 4. If you have symptoms such as fever, cough, shortness of breath then call your healthcare provider for further guidance 5. If you are sick, STAY AT HOME unless otherwise directed by your healthcare provider. 6. Follow directions from state and national officials regarding staying safe   At [facility] we value your feedback. You may receive a survey about your visit today. Please share your experience as we strive to create trusting relationships with our patients to provide genuine, compassionate, quality care.  We appreciate your understanding and patience as we review any laboratory studies, imaging, and other diagnostic tests that are ordered as we care for you. Our office policy is 5 business days for review of these results, and any emergent or urgent results are addressed in a timely manner for your best interest. If you do not hear from our office in 1 week,  please contact us.   We also encourage the use of MyChart, which contains your medical information for your review as well. If you are not enrolled in this feature, an access code is on this after visit summary for your convenience. Thank you for allowing [place] to be involved in your care.  It was great to see you today!  I hope you have a great summer!!

## 2018-12-17 NOTE — Assessment & Plan Note (Signed)
The patient has persistent diarrhea.  This is acute on chronic diarrhea.  He states he has had this intermittently for years.  Currently having 5-6 loose stools a day with occasional scant toilet tissue hematochezia associated with rectal itching likely hemorrhoidal or benign anorectal source in nature.  Recent colonoscopy possibly incomplete due to abrupt flexion near the cecum with the inability to identify landmarks.  At this point we will proceed with an air-contrast barium CT to ensure complete evaluation of the colon given his history of GIST tumors with small bowel resection.  Follow-up in 2 months.

## 2018-12-17 NOTE — Assessment & Plan Note (Signed)
The patient describes rectal itching.  Was given suppositories but they do not stay in long enough due to frequent bowel movements.  He has 5-6 loose bowel movements a day.  Try Preparation H over-the-counter which did not help.  At this point I will send in Anusol rectal cream to help with his rectal discomfort until stools can be solidified.  Follow-up in 2 months.

## 2018-12-30 ENCOUNTER — Ambulatory Visit
Admission: RE | Admit: 2018-12-30 | Discharge: 2018-12-30 | Disposition: A | Discharge status: Home / Self Care | Payer: Medicare PPO | Source: Ambulatory Visit | Attending: Nurse Practitioner | Admitting: Nurse Practitioner

## 2018-12-30 DIAGNOSIS — K2270 Barrett's esophagus without dysplasia: Secondary | ICD-10-CM

## 2018-12-30 DIAGNOSIS — R197 Diarrhea, unspecified: Secondary | ICD-10-CM

## 2018-12-30 DIAGNOSIS — L290 Pruritus ani: Secondary | ICD-10-CM

## 2019-01-04 ENCOUNTER — Telehealth: Payer: Self-pay | Admitting: Internal Medicine

## 2019-01-04 NOTE — Telephone Encounter (Signed)
Patient said he had a procedure last week ain [place] and wants to know if we have gotten the results yet

## 2019-01-05 ENCOUNTER — Telehealth: Payer: Self-pay | Admitting: Internal Medicine

## 2019-01-05 NOTE — Telephone Encounter (Signed)
Patient returned call

## 2019-01-05 NOTE — Telephone Encounter (Signed)
Pt notified that a message was sent to EG.

## 2019-01-05 NOTE — Telephone Encounter (Signed)
Lmom, to let pt know a message will be sent to the provider. EG, pt is inquiring about his 12/30/2018 procedure.

## 2019-01-06 NOTE — Telephone Encounter (Signed)
Noted  

## 2019-01-06 NOTE — Telephone Encounter (Signed)
See results note. 

## 2019-02-16 ENCOUNTER — Encounter: Payer: Self-pay | Admitting: Internal Medicine

## 2019-02-16 ENCOUNTER — Other Ambulatory Visit: Payer: Self-pay

## 2019-02-16 ENCOUNTER — Ambulatory Visit (INDEPENDENT_AMBULATORY_CARE_PROVIDER_SITE_OTHER): Payer: Medicare PPO | Admitting: Internal Medicine

## 2019-02-16 VITALS — BP 154/71 | HR 67 | Temp 97.1°F | Ht 69.0 in | Wt 156.6 lb

## 2019-02-16 DIAGNOSIS — R197 Diarrhea, unspecified: Secondary | ICD-10-CM

## 2019-02-16 NOTE — Progress Notes (Signed)
Primary Care Physician:  [name], MD Primary Gastroenterologist:  Dr. Gala Romney  Pre-Procedure History & Physical: HPI:  Donald Klein is a 73 y.o. male here for follow-up of chronic diarrhea, GERD.  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 Merrie Roof and Clovis Riley at [place].  Patient has diarrhea now for years.  Extensive stool studies here and elsewhere,  segmental biopsies of his colon failed to demonstrate any evidence of colitis / cause of dioarrhea.  Diagnosed with UC 30 years ago. He correlates the onset of nonbloody watery diarrhea with the use of metformin.  Reflux symptoms well controlled on omeprazole for 20 mg daily. History of Barrett's esophagus with recently biopsies negative for dysplasia. He has not yet had a drug holiday away from metformin. Based on what patient telling me, GIST  has enlarged in spite of being on Gleevec for several months.  He does have nausea and vomiting after taking Gleevec and sometimes does not get this medication down. VCE recently negative.  Has anorectal irritation and wipes a little blood when he is having exacerbation of diarrhea.  He has had several episodes of incontinence.  He has to strategically plan his travels around town based on bathroom locations.   Past Medical History:  Diagnosis Date  . Anemia   . Anxiety   . Barrett esophagus    Last EGD 05/27/08 Barrett's without dyplasia. EGD due 05/2011  . Chronic back pain   . Colitis    In 1980s, dx with UC by Dr. Humphrey Rolls at time of TCS, 1992 TCS, chronic colitis  . Depression   . Diabetes mellitus   . GERD (gastroesophageal reflux disease)   . Helicobacter pylori gastritis 2009   treated  . HTN (hypertension)   . PTSD (post-traumatic stress disorder)   . Sleep apnea   . 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 Tubular adenoma of colon 10/08/10   Last colonosocpy 1 cecal TA    Past Surgical  History:  Procedure Laterality Date  . APPENDECTOMY  July 2014   at time of small bowel resection  . BIOPSY  01/06/2018   Procedure: BIOPSY;  Surgeon: Daneil Dolin, MD;  [facility];  Service: Endoscopy;;  gastric biopsy , esophageal biopsy  . BIOPSY  10/29/2018   Procedure: BIOPSY;  Surgeon: Daneil Dolin, MD;  [facility]: [facility];  Service: Endoscopy;;  gastric, esophageal   . CHOLECYSTECTOMY    . COLONOSCOPY  10/08/2010   Normal rectum/tubular adenoma/normal random biopsy. Next TCS due 10/2015.  Marland Kitchen COLONOSCOPY N/A 09/30/2014   Dr. Gala Romney: Redunant colon . Status post segmental biopsy and stool sampling. Negative stool samples and colonic biopsies.   . COLONOSCOPY WITH PROPOFOL N/A 10/29/2018   Procedure: COLONOSCOPY WITH PROPOFOL;  Surgeon: Daneil Dolin, MD;  [facility]: [facility];  Service: Endoscopy;  Laterality: N/A;  12:00pm  . ESOPHAGOGASTRODUODENOSCOPY  07/31/2011   Dr. Doyce Para segment Barrett's esophagus s/p bx Hiatal hernia. Duodenal diverticulum. No dysplasia.Next EGD 07/2014.  Marland Kitchen ESOPHAGOGASTRODUODENOSCOPY N/A 09/30/2014   Dr. Gala Romney: Abnormal distal esophagus consistant with prior diagnosis of short segment Barrett's esophagus status post biopsy. 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 Hiatal hernia. Abnormal Gastric mucosa of uncertain significance status post gastric biopsy. path with +Barrett's esophagus but no dysplasia, mild chronic gastritis. Surveillance for Barrett's due in 2019  . ESOPHAGOGASTRODUODENOSCOPY N/A 01/06/2018   Procedure: ESOPHAGOGASTRODUODENOSCOPY (EGD);  Surgeon:  Jaelani Posa, Cristopher Estimable, MD;  [facility];  Service: Endoscopy;  Laterality: N/A;  12:00pm  . ESOPHAGOGASTRODUODENOSCOPY (EGD) WITH PROPOFOL N/A 10/29/2018   Procedure: ESOPHAGOGASTRODUODENOSCOPY (EGD) WITH PROPOFOL;  Surgeon: Daneil Dolin, MD;  [facility]: [facility];  Service: Endoscopy;  Laterality: N/A;  . EXPLORATORY LAPAROTOMY W/ BOWEL RESECTION  July 2014    [facility]: with lysis of adhesions, small bowel resection X 2, resection of mesenteric mass, appendectomy, path: fibromatosis   . 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 Authorizing Provider  Artificial Tear Ointment (DRY EYES OP) Place 1 drop into both eyes 2 (two) times daily as needed (dry eyes).    Yes [provider]  aspirin EC 81 MG tablet Take 81 mg by mouth daily.   Yes [provider]  cholecalciferol (VITAMIN D) 25 MCG (1000 UT) tablet Take 1,000 Units by mouth daily.  06/11/18  Yes [provider]  CRESTOR 20 MG tablet Take 10 mg by mouth every morning.  08/05/10  Yes [provider]  diazepam (VALIUM) 5 MG tablet Take 5 mg by mouth daily as needed for anxiety. 07/15/18  Yes [provider]  dicyclomine (BENTYL) 10 MG capsule Take 1 capsule (10 mg total) by mouth 4 (four) times daily as needed for spasms (diarrhea). 12/17/18  Yes Carlis Stable, NP  ferrous sulfate 325 (65 FE) MG tablet Take 325 mg by mouth daily with breakfast.   Yes [provider]  hydrocortisone (ANUSOL-HC) 2.5 % rectal cream Place 1 application rectally 2 (two) times daily. For up to 10 days at a time 12/17/18  Yes Carlis Stable, NP  imatinib (GLEEVEC) 400 MG tablet Take 400 mg by mouth daily.  09/21/18  Yes [provider]  JANUMET 50-1000 MG tablet Take 1 tablet by mouth 2 (two) times daily with a meal.  11/24/17  Yes [provider]  LOVAZA 1 G capsule Take 1 capsule by mouth 2 (two) times daily.  08/05/10  Yes [provider]  metoprolol (LOPRESSOR) 50 MG tablet Take 25 mg by mouth daily.  07/31/10  Yes [provider]  nitroGLYCERIN  (NITROSTAT) 0.4 MG SL tablet Place 0.4 mg under the tongue every 5 (five) minutes as needed for chest pain.   Yes [provider]  omeprazole (PRILOSEC) 20 MG capsule Take 20 mg by mouth 2 (two) times daily before a meal.   Yes [provider]  QUEtiapine (SEROQUEL) 50 MG tablet Take 50 mg by mouth at bedtime as needed for depression. 07/15/18  Yes [provider]  sertraline (ZOLOFT) 100 MG tablet Take 100 mg by mouth daily as needed (PTSD).  07/15/18  Yes [provider]  tamsulosin (FLOMAX) 0.4 MG CAPS capsule Take 0.4 mg by mouth at bedtime.    Yes [provider]  vitamin C (ASCORBIC ACID) 250 MG tablet Take 250 mg by mouth daily.   Yes [provider]    Allergies as of 02/16/2019 - Review Complete 02/16/2019  Allergen Reaction Noted  . Lisinopril Other (See Comments) 12/29/2017  . Lovenox [enoxaparin sodium] Hives and Rash 09/30/2014  . Penicillins Rash     Family History  Problem Relation  Age of Onset  . Heart disease Mother   . Suicidality Mother   . Heart attack Father        73  . Heart disease Father   . Heart disease Sister   . Colon cancer Neg Hx     Social History   Socioeconomic History  . Marital status: Married    Spouse name: Not on file  . Number of children: 1  . Years of education: Not on file  . Highest education level: Not on file  Occupational History  . Occupation: disability  Social Needs  . Financial resource strain: Not on file  . Food insecurity    Worry: Not on file    Inability: Not on file  . Transportation needs    Medical: Not on file    Non-medical: Not on file  Tobacco Use  . Smoking status: Never Smoker  . Smokeless tobacco: Never Used  Substance and Sexual Activity  . Alcohol use: No    Alcohol/week: 0.0 standard drinks  . Drug use: No  . Sexual activity: Yes    Partners: Female    Birth control/protection: None    Comment: spouse  Lifestyle  . Physical activity    Days  per week: Not on file    Minutes per session: Not on file  . Stress: Not on file  Relationships  . Social Herbalist on phone: Not on file    Gets together: Not on file    Attends religious service: Not on file    Active member of club or organization: Not on file    Attends meetings of clubs or organizations: Not on file    Relationship status: Not on file  . Intimate partner violence    Fear of current or ex partner: Not on file    Emotionally abused: Not on file    Physically abused: Not on file    Forced sexual activity: Not on file  Other Topics Concern  . Not on file  Social History Narrative  . Not on file    Review of Systems: See HPI, otherwise negative ROS  Physical Exam: BP (!) 154/71   Pulse 67   Temp (!) 97.1 F (36.2 C) (Oral)   Ht 5\' 9"  (1.753 m)   Wt 156 lb 9.6 oz (71 kg)   BMI 23.13 kg/m  General:   Alert,  Well-developed, well-nourished, pleasant and cooperative in NAD Mouth:  No deformity or lesions. Neck:  Supple; no masses or thyromegaly. No significant cervical adenopathy. Lungs:  Clear throughout to auscultation.   No wheezes, crackles, or rhonchi. No acute distress. Heart:  Regular rate and rhythm; no murmurs, clicks, rubs,  or gallops. Abdomen: Non-distended, normal bowel sounds.  Soft and nontender without appreciable mass or hepatosplenomegaly.  Pulses:  Normal pulses noted. Extremities:  Without clubbing or edema.  Impression/Plan: 73 year old gentleman with history of GIST, GERD and incessant nonbloody diarrhea.  No evidence of inflammatory bowel disease on recent colonoscopy.  Diarrhea temporally associate with metformin containing products.  In this setting, although he may have some underlying IBS, metformin is the cause of his diarrhea until proven otherwise.  Gleevec may or may not be exacerbating diarrhea as well   Recommendations:   Continue omeprazole daily  Continue Bentyl and Imodium as needed  SEE YOUR PRIMARY  CARE PHYSICIAN ABOUT COMING OFF ANY METFORMIN CONTAINING PRODUCTS  Return here in 6-8 weeks   Notice: This dictation was prepared with Viviann Spare  dictation along with smaller phrase technology. Any transcriptional errors that result from this process are unintentional and may not be corrected upon review.

## 2019-02-16 NOTE — Patient Instructions (Signed)
Continue omeprazole daily  Continue Bentyl and Imodium as needed  SEE YOUR PRIMARY CARE PHYSICIAN ABOUT COMING OFF ANY METFORMIN CONTAINING PRODUCTS  Return here in 6-8 weeks

## 2019-04-09 ENCOUNTER — Telehealth: Payer: Self-pay | Admitting: Internal Medicine

## 2019-04-09 NOTE — Telephone Encounter (Signed)
error 

## 2019-04-13 ENCOUNTER — Other Ambulatory Visit: Payer: Self-pay

## 2019-04-13 ENCOUNTER — Encounter: Payer: Self-pay | Admitting: Internal Medicine

## 2019-04-13 ENCOUNTER — Ambulatory Visit (INDEPENDENT_AMBULATORY_CARE_PROVIDER_SITE_OTHER): Payer: Medicare PPO | Admitting: Internal Medicine

## 2019-04-13 VITALS — BP 125/65 | HR 63 | Temp 97.7°F | Ht 69.0 in | Wt 161.8 lb

## 2019-04-13 DIAGNOSIS — K219 Gastro-esophageal reflux disease without esophagitis: Secondary | ICD-10-CM

## 2019-04-13 DIAGNOSIS — C49A2 Gastrointestinal stromal tumor of stomach: Secondary | ICD-10-CM | POA: Diagnosis not present

## 2019-04-13 NOTE — Patient Instructions (Addendum)
Continue Omeprazole 20 mg twice daily  No future EGD or colonoscopy unless recommended by [place] visit 6 months and as needed

## 2019-04-13 NOTE — Progress Notes (Signed)
Primary Care Physician:  [name], MD Primary Gastroenterologist:  Dr. Gala Romney  Pre-Procedure History & Physical: HPI:  Donald Klein is a 73 y.o. male here for follow-up GERD, diarrhea.  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  Apparently, CT at [facility] recently demonstrated multiple new liver lesions for which a liver biopsy is planned later in the week. CT also demonstrated the possibility of a fistula between the ascending colon and duodenum.  Recent EGD demonstrated an abnormal esophagus concerning for short segment Barrett's biopsies  -  Histology  did not bear this out.  No tumor in stomach seen.  Biopsies negative for H. pylori.  Diarrhea has essentially resolved since stopping Metformin.  Somewhat distorted anatomy in the ascending colon at recent colonoscopy.  Typical landmarks not well identified.   Optical colonoscopy was complemented by a VC which demonstrated no concerning lesions.  Past Medical History:  Diagnosis Date  . Anemia   . Anxiety   . Barrett esophagus    Last EGD 05/27/08 Barrett's without dyplasia. EGD due 05/2011  . Chronic back pain   . Colitis    In 1980s, dx with UC by Dr. Humphrey Rolls at time of TCS, 1992 TCS, chronic colitis  . Depression   . Diabetes mellitus   . GERD (gastroesophageal reflux disease)   . Helicobacter pylori gastritis 2009   treated  . HTN (hypertension)   . PTSD (post-traumatic stress disorder)   . Sleep apnea   . 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 Tubular adenoma of colon 10/08/10   Last colonosocpy 1 cecal TA    Past Surgical History:  Procedure Laterality Date  . APPENDECTOMY  July 2014   at time of small bowel resection  . BIOPSY  01/06/2018   Procedure: BIOPSY;  Surgeon: Daneil Dolin, MD;  [facility]: [facility];  Service: Endoscopy;;  gastric biopsy , esophageal biopsy  . BIOPSY  10/29/2018   Procedure: BIOPSY;  Surgeon: Daneil Dolin,  MD;  Location: [facility];  Service: Endoscopy;;  gastric, esophageal   . CHOLECYSTECTOMY    . COLONOSCOPY  10/08/2010   Normal rectum/tubular adenoma/normal random biopsy. Next TCS due 10/2015.  Marland Kitchen COLONOSCOPY N/A 09/30/2014   Dr. Gala Romney: Redunant colon . Status post segmental biopsy and stool sampling. Negative stool samples and colonic biopsies.   . COLONOSCOPY WITH PROPOFOL N/A 10/29/2018   Procedure: COLONOSCOPY WITH PROPOFOL;  Surgeon: Daneil Dolin, MD;  Location: [facility];  Service: Endoscopy;  Laterality: N/A;  12:00pm  . ESOPHAGOGASTRODUODENOSCOPY  07/31/2011   Dr. Doyce Para segment Barrett's esophagus s/p bx Hiatal hernia. Duodenal diverticulum. No dysplasia.Next EGD 07/2014.  Marland Kitchen ESOPHAGOGASTRODUODENOSCOPY N/A 09/30/2014   Dr. Gala Romney: Abnormal distal esophagus consistant with prior diagnosis of short segment Barrett's esophagus status post biopsy. 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 Hiatal hernia. Abnormal Gastric mucosa of uncertain significance status post gastric biopsy. path with +Barrett's esophagus but no dysplasia, mild chronic gastritis. Surveillance for Barrett's due in 2019  . ESOPHAGOGASTRODUODENOSCOPY N/A 01/06/2018   Procedure: ESOPHAGOGASTRODUODENOSCOPY (EGD);  Surgeon: Daneil Dolin, MD;  Location: [facility];  Service: Endoscopy;  Laterality: N/A;  12:00pm  . ESOPHAGOGASTRODUODENOSCOPY (EGD) WITH PROPOFOL N/A 10/29/2018   Procedure: ESOPHAGOGASTRODUODENOSCOPY (EGD) WITH PROPOFOL;  Surgeon: Daneil Dolin, MD;  Location: [facility];  Service: Endoscopy;  Laterality: N/A;  . EXPLORATORY LAPAROTOMY W/ BOWEL RESECTION  July 2014   [facility]:  with lysis of adhesions, small bowel resection X 2, resection of mesenteric mass, appendectomy, path: fibromatosis   . 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 Authorizing Provider  Artificial Tear Ointment (DRY EYES OP) Place 1 drop into both eyes 2 (two) times daily as needed (dry eyes).    Yes [provider]  aspirin EC 81 MG tablet Take 81 mg by mouth daily.   Yes [provider]  cholecalciferol (VITAMIN D) 25 MCG (1000 UT) tablet Take 1,000 Units by mouth daily.  06/11/18  Yes [provider]  CRESTOR 20 MG tablet Take 10 mg by mouth every morning.  08/05/10  Yes [provider]  diazepam (VALIUM) 5 MG tablet Take 5 mg by mouth daily as needed for anxiety. 07/15/18  Yes [provider]  dicyclomine (BENTYL) 10 MG capsule Take 1 capsule (10 mg total) by mouth 4 (four) times daily as needed for spasms (diarrhea). 12/17/18  Yes Carlis Stable, NP  ferrous sulfate 325 (65 FE) MG tablet Take 325 mg by mouth daily with breakfast.   Yes [provider]  glipiZIDE (GLUCOTROL) 5 MG tablet Take 5 mg by mouth daily before breakfast.   Yes [provider]  hydrocortisone (ANUSOL-HC) 2.5 % rectal cream Place 1 application rectally 2 (two) times daily. For up to 10 days at a time 12/17/18  Yes Carlis Stable, NP  imatinib (GLEEVEC) 400 MG tablet Take 400 mg by mouth daily.  09/21/18  Yes [provider]  LOVAZA 1 G capsule Take 1 capsule by mouth 2 (two) times daily.  08/05/10  Yes [provider]  metoprolol (LOPRESSOR) 50 MG tablet Take 25 mg by mouth daily.  07/31/10  Yes [provider]  nitroGLYCERIN (NITROSTAT) 0.4 MG SL tablet Place 0.4 mg under the tongue every 5 (five) minutes as needed for chest pain.   Yes [provider]  omeprazole (PRILOSEC) 20 MG capsule Take 20 mg by mouth 2 (two) times daily before a meal.   Yes [provider]  QUEtiapine (SEROQUEL) 50 MG tablet Take 50 mg by mouth at bedtime as needed for  depression. 07/15/18  Yes [provider]  sertraline (ZOLOFT) 100 MG tablet Take 100 mg by mouth daily as needed (PTSD).  07/15/18  Yes [provider]  SITagliptin Phosphate (JANUVIA PO) Take 100 mg by mouth daily.   Yes [provider]  tamsulosin (FLOMAX) 0.4 MG CAPS capsule Take 0.4 mg by mouth at bedtime.    Yes [provider]  vitamin C (ASCORBIC ACID) 250 MG tablet Take 250 mg by mouth daily.   Yes [provider]  JANUMET 50-1000 MG tablet Take 1 tablet by mouth 2 (two) times daily with a meal.  11/24/17   [provider]    Allergies as of 04/13/2019 - Review Complete 04/13/2019  Allergen Reaction Noted  . Lisinopril Other (See Comments) 12/29/2017  . Lovenox [enoxaparin sodium] Hives and Rash 09/30/2014  . Penicillins Rash     Family History  Problem Relation Age of Onset  . Heart disease Mother   . Suicidality Mother   . Heart attack Father  73  . Heart disease Father   . Heart disease Sister   . Colon cancer Neg Hx     Social History   Socioeconomic History  . Marital status: Married    Spouse name: Not on file  . Number of children: 1  . Years of education: Not on file  . Highest education level: Not on file  Occupational History  . Occupation: disability  Social Needs  . Financial resource strain: Not on file  . Food insecurity    Worry: Not on file    Inability: Not on file  . Transportation needs    Medical: Not on file    Non-medical: Not on file  Tobacco Use  . Smoking status: Never Smoker  . Smokeless tobacco: Never Used  Substance and Sexual Activity  . Alcohol use: No    Alcohol/week: 0.0 standard drinks  . Drug use: No  . Sexual activity: Yes    Partners: Female    Birth control/protection: None    Comment: spouse  Lifestyle  . Physical activity    Days per week: Not on file    Minutes per session: Not on file  . Stress: Not on file  Relationships  . Social Product manager on phone: Not on file    Gets together: Not on file    Attends religious service: Not on file    Active member of club or organization: Not on file    Attends meetings of clubs or organizations: Not on file    Relationship status: Not on file  . Intimate partner violence    Fear of current or ex partner: Not on file    Emotionally abused: Not on file    Physically abused: Not on file    Forced sexual activity: Not on file  Other Topics Concern  . Not on file  Social History Narrative  . Not on file    Review of Systems: See HPI, otherwise negative ROS  Physical Exam: BP 125/65   Pulse 63   Temp 97.7 F (36.5 C) (Oral)   Ht 5\' 9"  (1.753 m)   Wt 161 lb 12.8 oz (73.4 kg)   BMI 23.89 kg/m  General:   Alert,  Well-developed, well-nourished, pleasant and cooperative in NAD Neck:  Supple; no masses or thyromegaly. No significant cervical adenopathy. Lungs:  Clear throughout to auscultation.   No wheezes, crackles, or rhonchi. No acute distress. Heart:  Regular rate and rhythm; no murmurs, clicks, rubs,  or gallops. Abdomen: Non-distended, normal bowel sounds.  Soft and nontender without appreciable mass or hepatosplenomegaly.  Pulses:  Normal pulses noted. Extremities:  Without clubbing or edema.  Impression/Plan: 73 year old gentleman with well-controlled GERD.  history of short segment Barrett's esophagus  -  minimal changes at recent EGD  - biopsies not consistent.  No further screening/ surveillance recommended from  standpoint of Barrett's epithelium. Diarrhea resolved since stopping Metformin.  Abnormal appearing colon on the right side but no suspicion for neoplasm.  No obvious fistula seen.  VC complemented VCwhich was negative.  CT findings recently noted  New liver lesions are most concerning.  This gentleman really looks clinically well at this time.  Recommendations:  [facility] with liver [facility].  Continue omeprazole 20 mg twice daily indefinitely  this is controlling his reflux symptoms very well  No further EGD and colonoscopy unless directed otherwise by tertiary care center.  Office visit here in 6 months and as needed.  Notice: This dictation was prepared with Dragon dictation along with smaller phrase technology. Any transcriptional errors that result from this process are unintentional and may not be corrected upon review.

## 2019-05-07 HISTORY — PX: PROSTATE BIOPSY: SHX241

## 2019-05-24 ENCOUNTER — Encounter: Payer: Self-pay | Admitting: Urology

## 2019-06-03 ENCOUNTER — Other Ambulatory Visit: Payer: Self-pay

## 2019-06-03 DIAGNOSIS — R972 Elevated prostate specific antigen [PSA]: Secondary | ICD-10-CM

## 2019-06-23 DIAGNOSIS — K769 Liver disease, unspecified: Secondary | ICD-10-CM | POA: Insufficient documentation

## 2019-06-23 DIAGNOSIS — E1165 Type 2 diabetes mellitus with hyperglycemia: Secondary | ICD-10-CM | POA: Insufficient documentation

## 2019-06-23 DIAGNOSIS — R609 Edema, unspecified: Secondary | ICD-10-CM | POA: Insufficient documentation

## 2019-06-30 IMAGING — DX DG SHOULDER 2+V*L*
3 series · 3 of 3 positions shown · non-contrast
Comparison: None.

CLINICAL DATA: Chronic bilateral shoulder pain without known
injury.

EXAM:
LEFT SHOULDER - 2+ VIEW

[shoulder grashey]
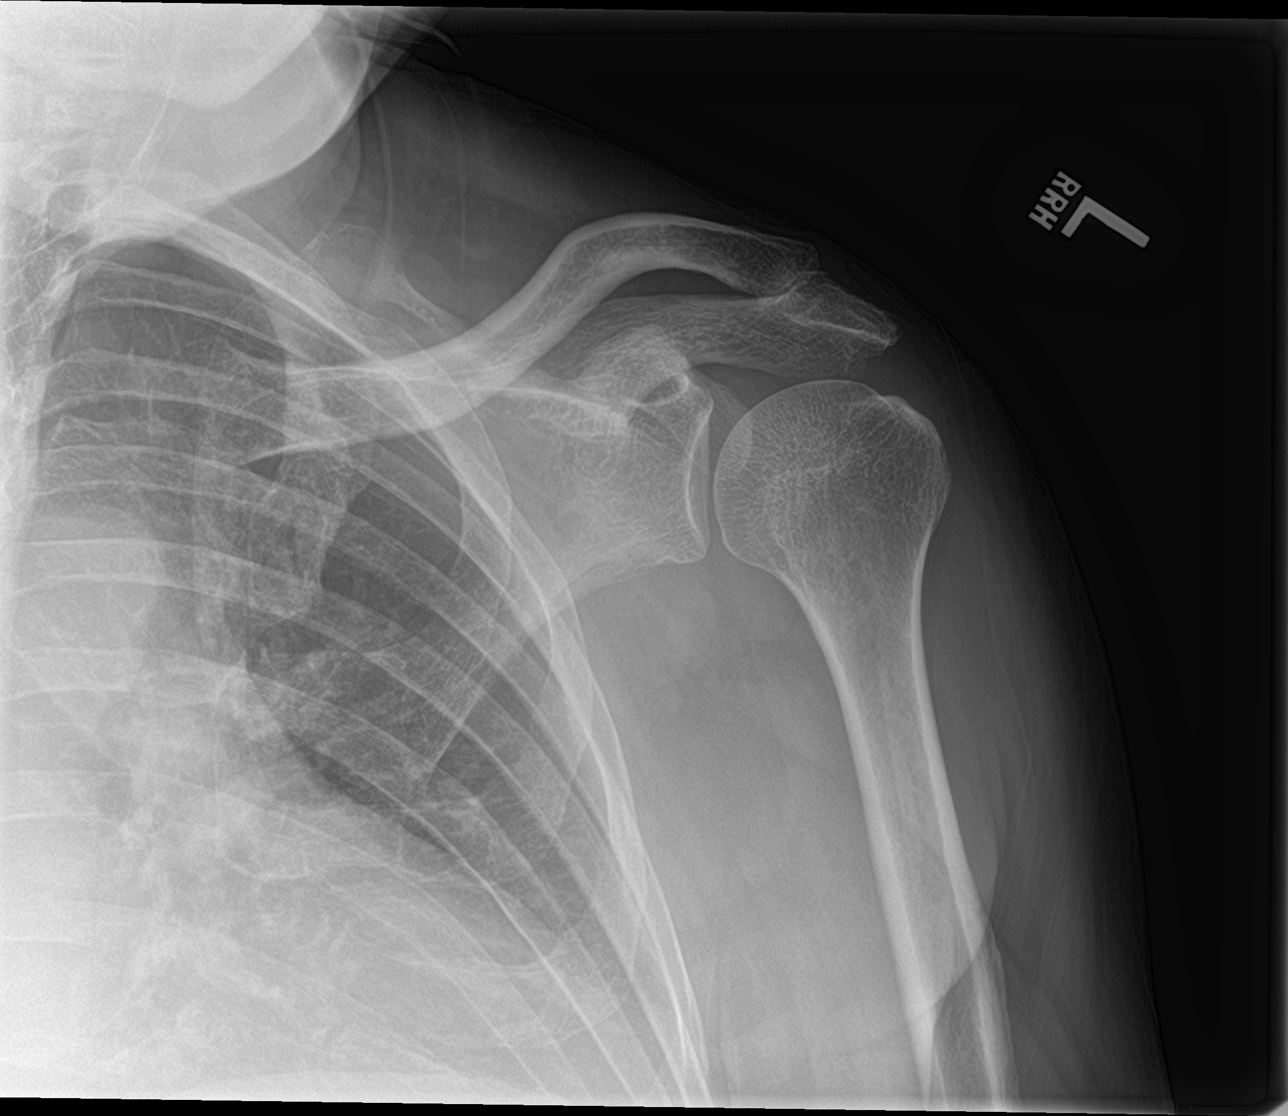

[shoulder y view]
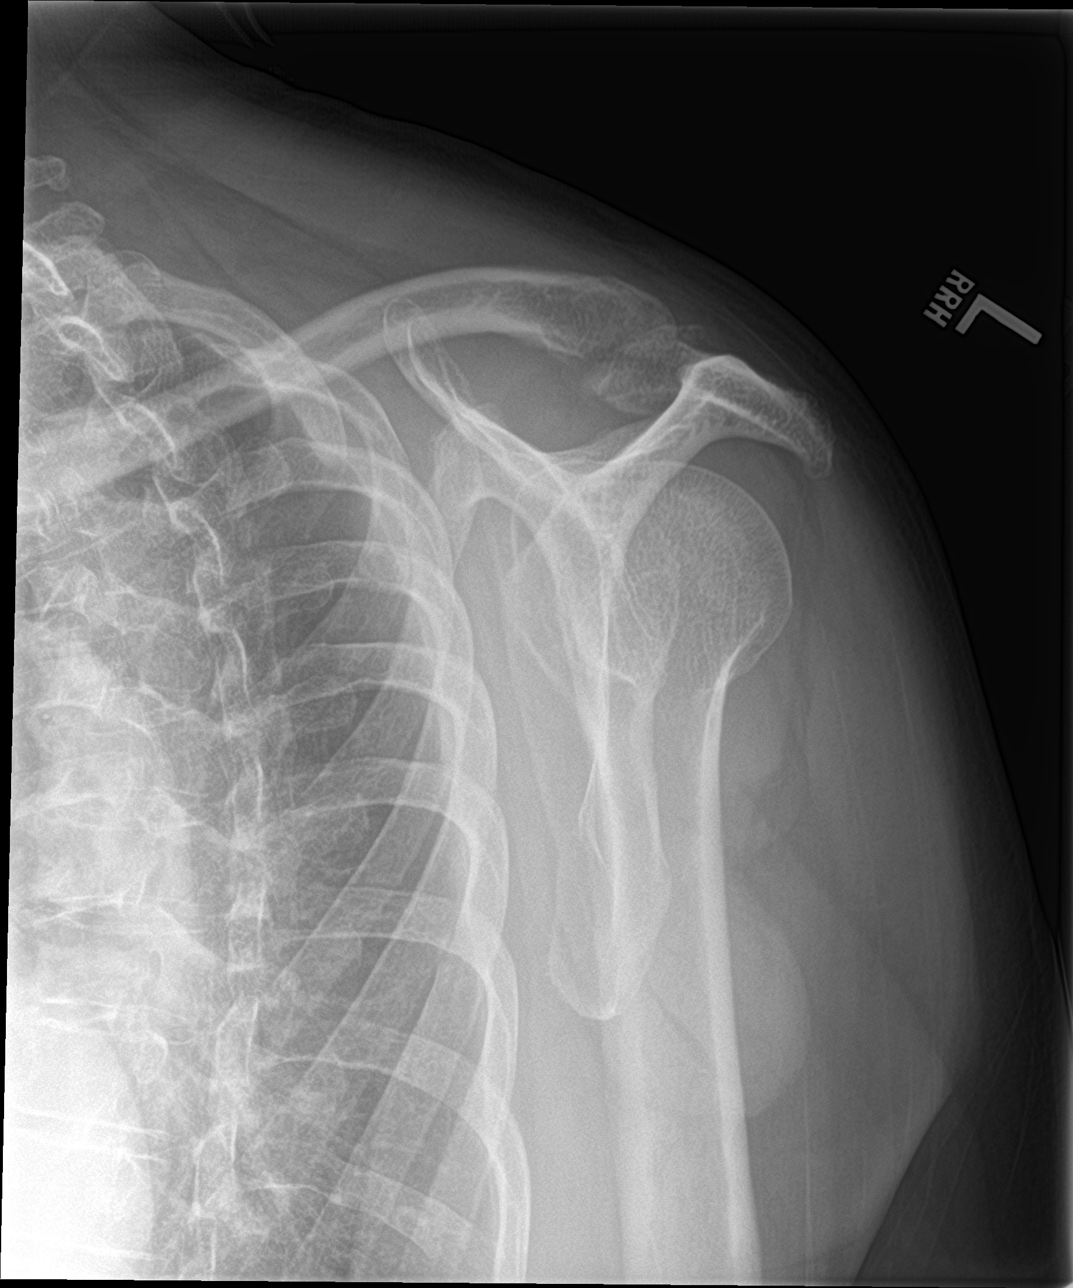

[shoulder axillary]
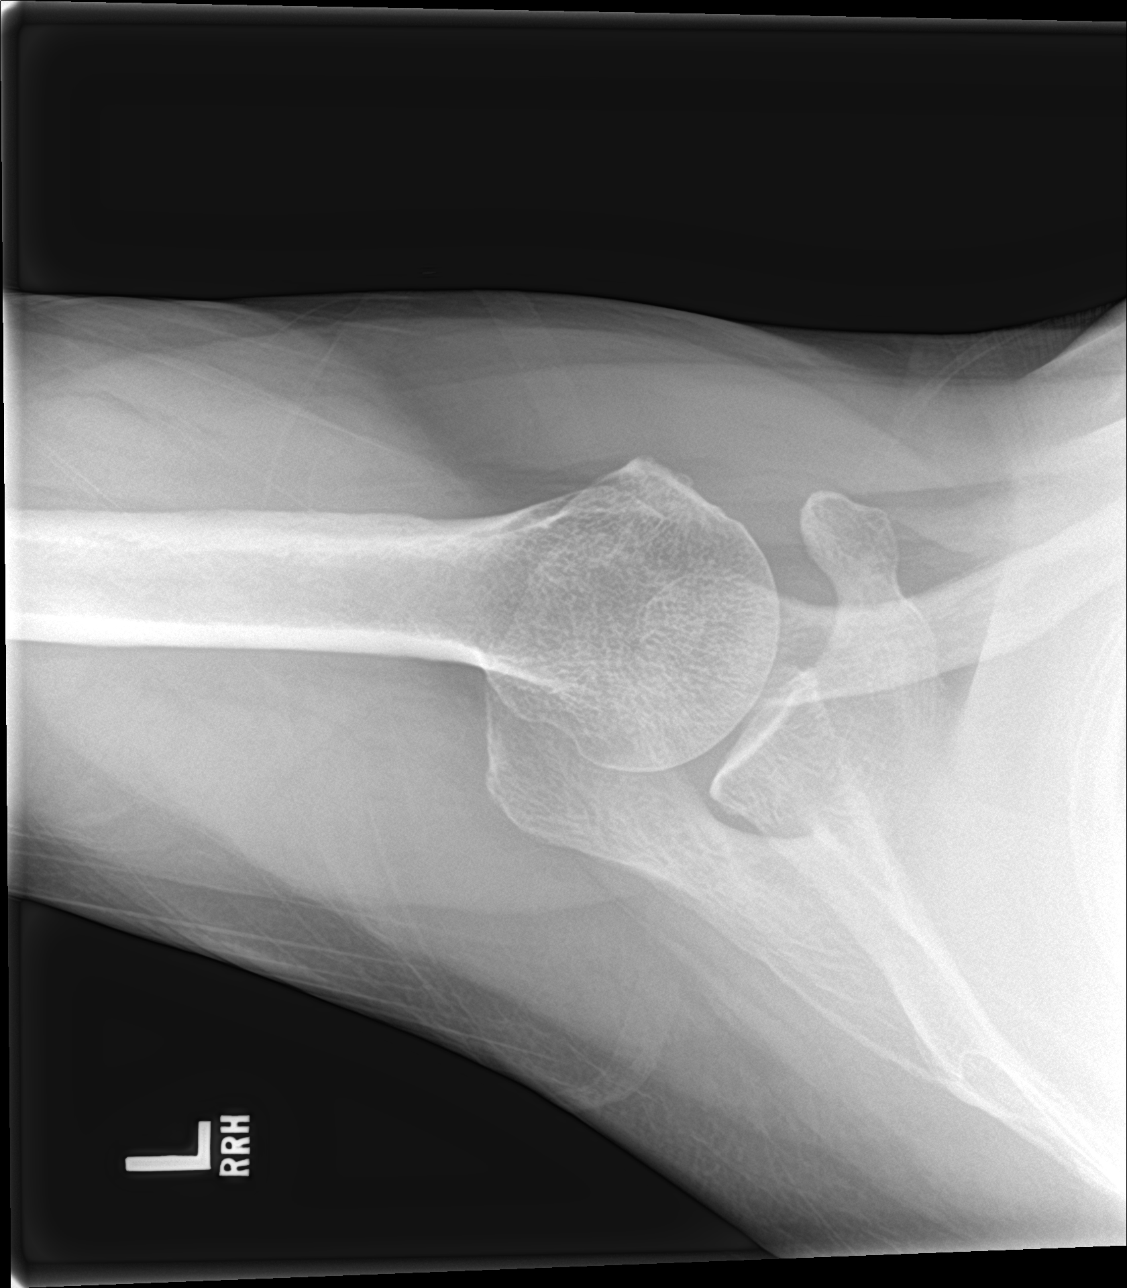

[3 of 3 positions shown; findings below may reference images not displayed]

FINDINGS: There is no evidence of fracture or dislocation. Severe narrowing of
the left acromioclavicular joint is noted. Glenohumeral joint
appears normal. Soft tissues are unremarkable.
IMPRESSION: Severe degenerative joint disease of the left acromioclavicular
joint. No acute abnormality seen in the left shoulder.

## 2019-07-28 IMAGING — MR MR SHOULDER*L* W/O CM
4 of 5 series · 19 of 40 positions shown · non-contrast
Comparison: Plain films left shoulder 01/20/2018.

CLINICAL DATA: Left shoulder pain for 2 years.  No known injury.

EXAM:
MRI OF THE LEFT SHOULDER WITHOUT CONTRAST
TECHNIQUE: Multiplanar, multisequence MR imaging of the shoulder was performed.
No intravenous contrast was administered.

[Series 3: pdfs axial · axial · 4.0mm · 0.28mm/px · z∈[-47,+27]mm · 4 of 20 slices shown]
[im 1/20]
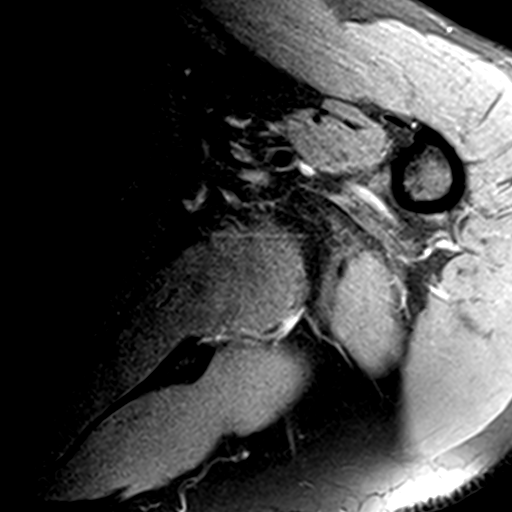
[im 3/20]
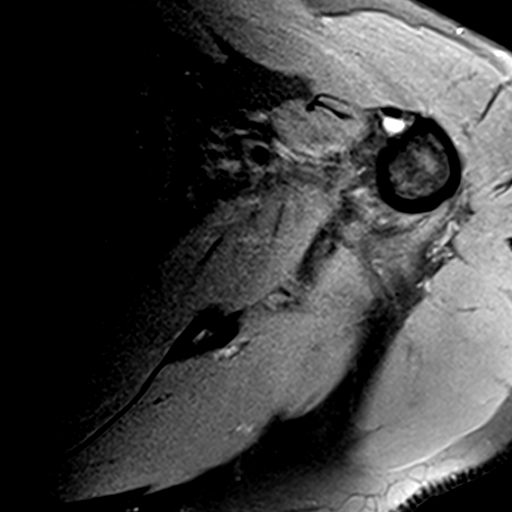
[im 11/20]
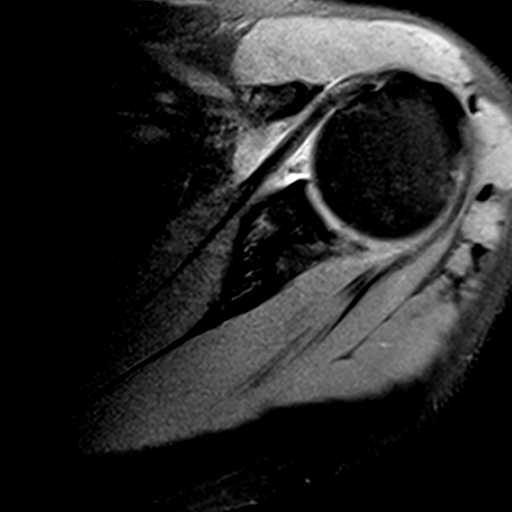
[im 17/20]
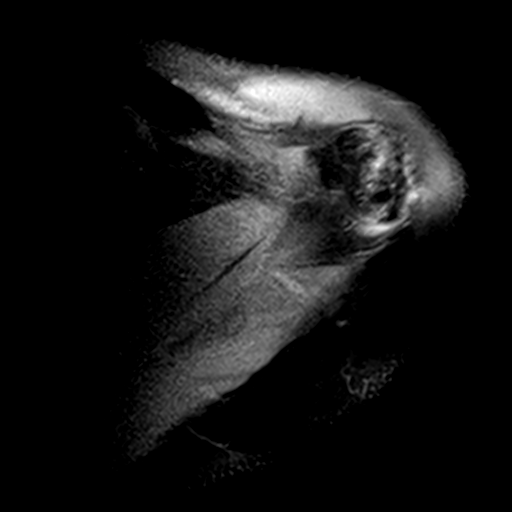

[Series 4: t2fs coronal · oblique · 4.0mm · 0.28mm/px · 3 of 20 slices shown]
[im 4/20]
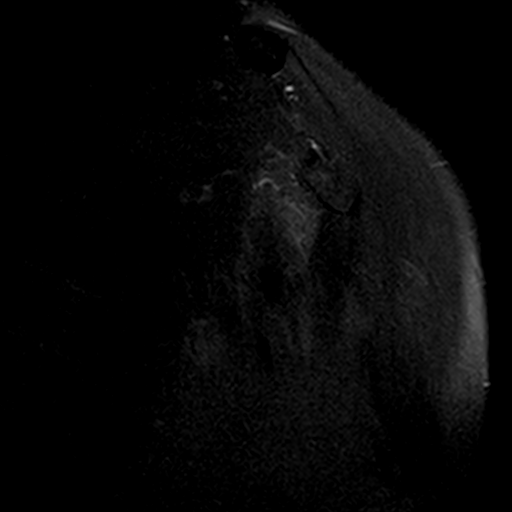
[im 10/20]
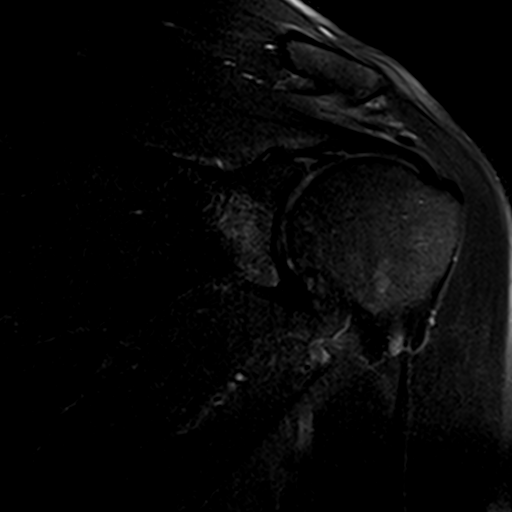
[im 16/20]
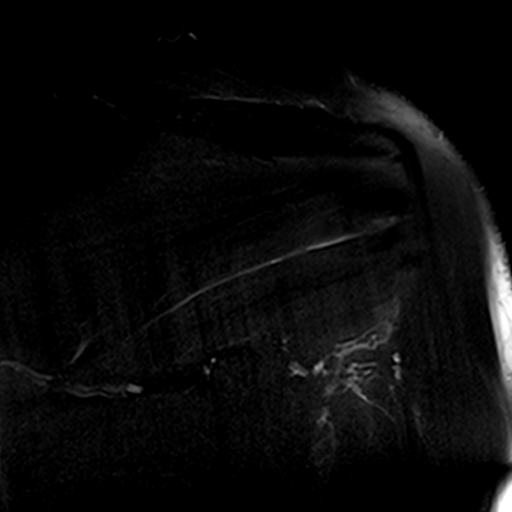

[Series 5: pdfs coronal · oblique · 4.0mm · 0.28mm/px · 3 of 20 slices shown]
[im 4/20]
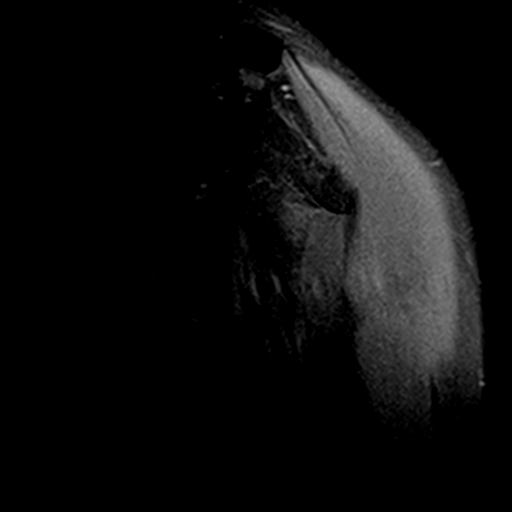
[im 10/20]
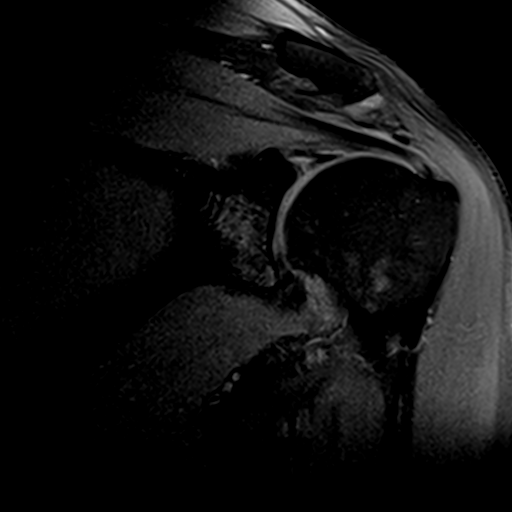
[im 16/20]
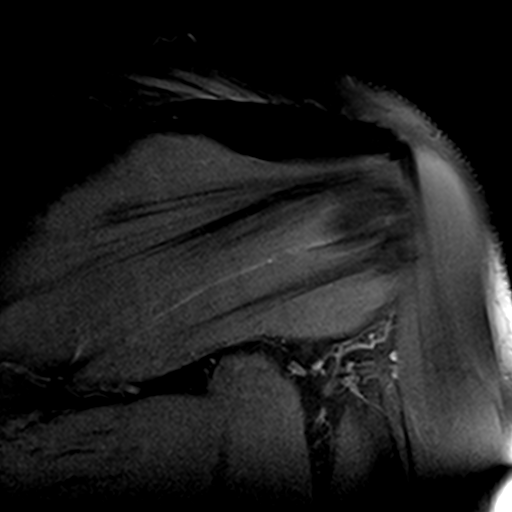

[Series 6: T1 · coronal · 4.0mm · 0.28mm/px · 9 of 24 slices shown]
[im 1/24]
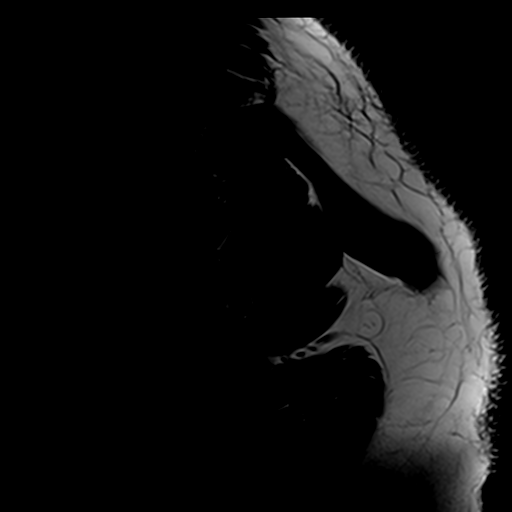
[im 3/24]
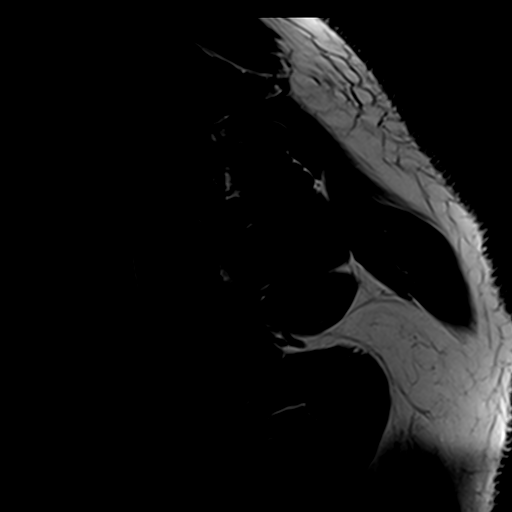
[im 6/24]
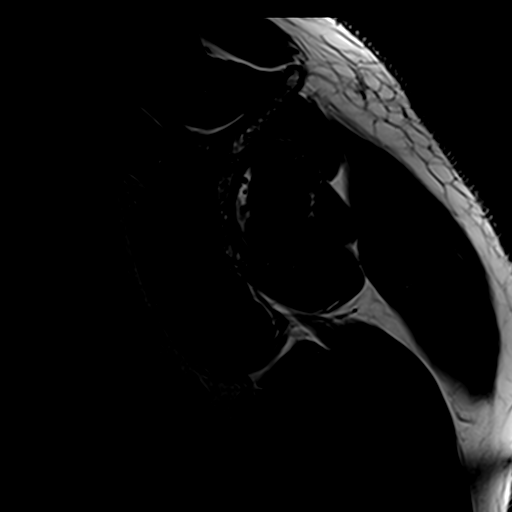
[im 9/24]
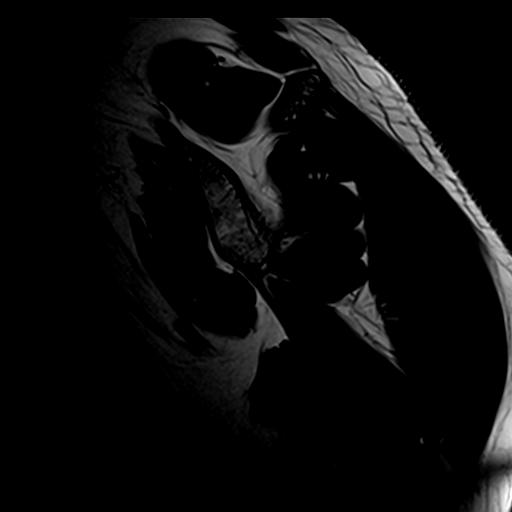
[im 12/24]
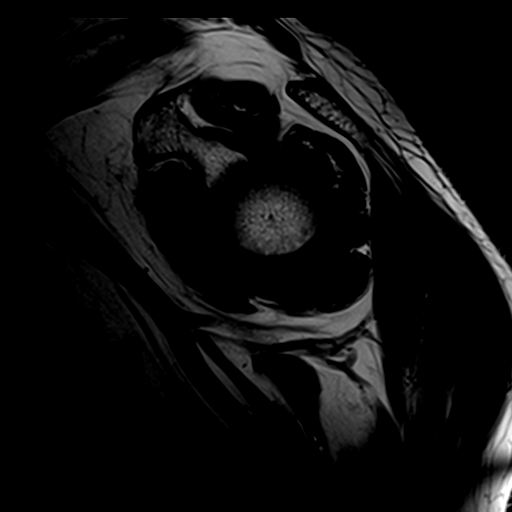
[im 15/24]
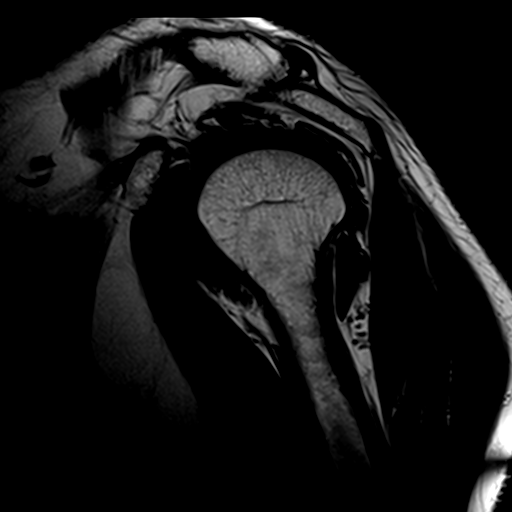
[im 18/24]
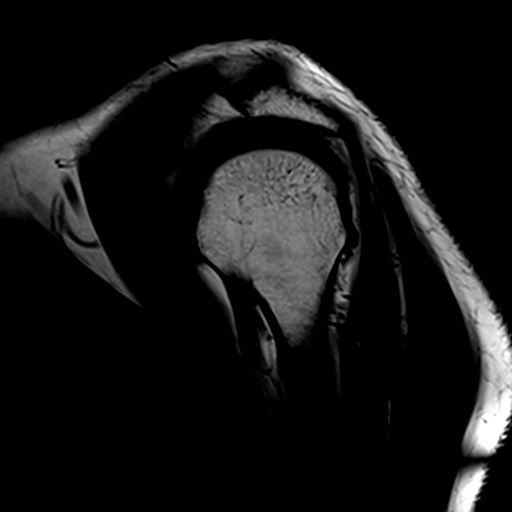
[im 21/24]
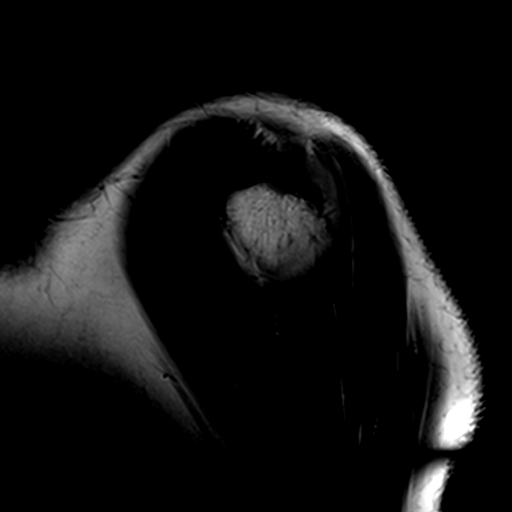
[im 24/24]
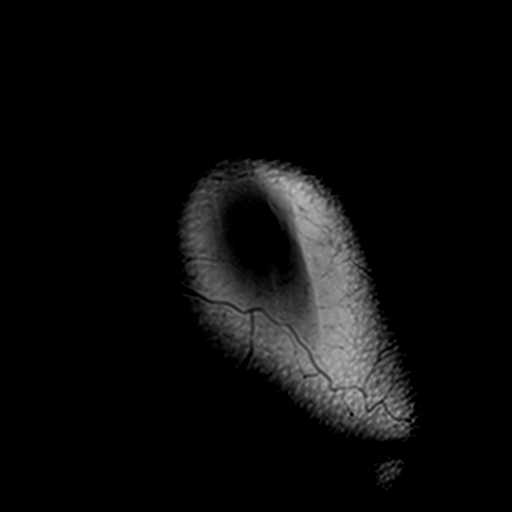

[19 of 40 positions shown; findings below may reference images not displayed]

FINDINGS: Rotator cuff:  Intact and normal in appearance.

Muscles:  Normal without atrophy or focal lesion.

Biceps long head:  Intact and normal in appearance.

Acromioclavicular Joint: Mild degenerative change is seen. Type 2
acromion. No subacromial/subdeltoid bursal fluid.

Glenohumeral Joint: Cartilage is thinned and there is mild marrow
edema in the glenoid. Hypertrophied synovium is seen in the
subscapularis recess between the coracoid process and subscapularis
tendon. Patchy, very mild marrow edema is seen in the coracoid
process consistent with reactive change. The inferior glenohumeral
ligament is mildly thickened with intermediate increased T2 signal.

Labrum:  Intact.

Bones:  No fracture or worrisome lesion.

Other: None.
IMPRESSION: Dominant finding is mild glenohumeral osteoarthritis. There is
synovitis about the joint with focal synovial hypertrophy noted in
the subscapularis recess. Associated mild, patchy reactive marrow
edema in the coracoid process is noted.

Findings compatible with adhesive capsulitis.

Intact and normal appearing rotator cuff and long head of biceps.

Mild acromioclavicular osteoarthritis.

## 2019-11-22 ENCOUNTER — Encounter: Payer: Self-pay | Admitting: Urology

## 2019-11-22 ENCOUNTER — Ambulatory Visit (INDEPENDENT_AMBULATORY_CARE_PROVIDER_SITE_OTHER): Payer: Medicare PPO | Admitting: Urology

## 2019-11-22 ENCOUNTER — Other Ambulatory Visit: Payer: Self-pay

## 2019-11-22 ENCOUNTER — Other Ambulatory Visit: Payer: Self-pay | Admitting: Urology

## 2019-11-22 VITALS — BP 186/68 | HR 67 | Temp 98.1°F | Ht 69.0 in | Wt 170.0 lb

## 2019-11-22 DIAGNOSIS — R351 Nocturia: Secondary | ICD-10-CM | POA: Insufficient documentation

## 2019-11-22 DIAGNOSIS — N401 Enlarged prostate with lower urinary tract symptoms: Secondary | ICD-10-CM | POA: Diagnosis not present

## 2019-11-22 DIAGNOSIS — R972 Elevated prostate specific antigen [PSA]: Secondary | ICD-10-CM | POA: Diagnosis not present

## 2019-11-22 DIAGNOSIS — N138 Other obstructive and reflux uropathy: Secondary | ICD-10-CM

## 2019-11-22 LAB — PSA: PSA: 12 ng/mL — ABNORMAL HIGH (ref <=4.0)

## 2019-11-22 MED ORDER — TAMSULOSIN HCL 0.4 MG PO CAPS: 0.4000 mg | ORAL_CAPSULE | Freq: Every day | ORAL | 3 refills | Status: DC

## 2019-11-22 MED ORDER — ALFUZOSIN HCL ER 10 MG PO TB24: 10.0000 mg | ORAL_TABLET | Freq: Every day | ORAL | 11 refills | Status: DC

## 2019-11-22 NOTE — Progress Notes (Signed)
11/22/2019 4:00 PM   Donald Klein 27-Jan-1946 [id number]  Referring provider: [name], MD [address],  [place] [address]  nocturia  HPI: Donald Klein is a 73yo here for followup for BPH with LUTS including nocturia and elevated PSA. PSA increased to 15.5 in 08/2019. PSA was previously 5.9 in 11/2018. Per patient he saw a urologist at the [facility] who recommended a prostate MRI. He has never had a prostate biopsy. Patient is currently on flomax for his BPH. Strong stream. No urinary urgency. He has frequency every 1-2 hours. Nocturia 3-4x   His records from [facility] are as follows: My PSA is elevated above the normal range. HPI: Donald Klein is a 74 year-old male established patient who is here for an elevated PSA.  His PSA is 5.5. He has had PSA's drawn prior to this one. He has had elevated PSA's prior to this one. He indicates there were no urinary problems when the PSA was drawn. He has not had a prostate nodule on a physical examination. He has not had recurrent prostate infections or chronic prostatitis.   He has not been on antibiotics for prostate infections previously. He has not had a prostate biopsy done.   05/20/2018: PSA was 4.9 in 2018 and then increased to 5.5 in 2019.   11/11/2018: No recent PSA. 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 HPI: He first noticed the symptom approximately 05/06/2016. He usually gets up at night to urinate 1 time. He does not have nights when he does not get up to urinate at all. He does not have trouble falling back asleep once he has been woken up at night.   He does not usually have swelling in his hands and feet during the day. He does not take a diuretic. He does not have to strain or bear down to start his urinary stream.   05/20/2018: He has been having worsening nocturia 5-6x and it has improved to 1x on  flomax 0.4mg  daily   11/11/2018: Pt has stable LUTS on flomax 0.4mg       PMH: Past Medical History:  Diagnosis Date  . Anemia   . Anxiety   . Barrett esophagus    Last EGD 05/27/08 Barrett's without dyplasia. EGD due 05/2011  . Chronic back pain   . Colitis    In 1980s, dx with UC by Dr. Humphrey Rolls at time of TCS, 1992 TCS, chronic colitis  . Depression   . Diabetes mellitus   . GERD (gastroesophageal reflux disease)   . Helicobacter pylori gastritis 2009   treated  . HTN (hypertension)   . PTSD (post-traumatic stress disorder)   . Sleep apnea   . 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 Tubular adenoma of colon 10/08/10   Last colonosocpy 1 cecal TA    Surgical History: Past Surgical History:  Procedure Laterality Date  . APPENDECTOMY  July 2014   at time of small bowel resection  . BIOPSY  01/06/2018   Procedure: BIOPSY;  Surgeon: Daneil Dolin, MD;  [facility];  Service: Endoscopy;;  gastric biopsy , esophageal biopsy  . BIOPSY  10/29/2018   Procedure: BIOPSY;  Surgeon: Daneil Dolin, MD;  [facility];  Service: Endoscopy;;  gastric, esophageal   .  CHOLECYSTECTOMY    . COLONOSCOPY  10/08/2010   Normal rectum/tubular adenoma/normal random biopsy. Next TCS due 10/2015.  Marland Kitchen COLONOSCOPY N/A 09/30/2014   Dr. Gala Romney: Redunant colon . Status post segmental biopsy and stool sampling. Negative stool samples and colonic biopsies.   . COLONOSCOPY WITH PROPOFOL N/A 10/29/2018   Procedure: COLONOSCOPY WITH PROPOFOL;  Surgeon: Daneil Dolin, MD;  Location: [facility];  Service: Endoscopy;  Laterality: N/A;  12:00pm  . ESOPHAGOGASTRODUODENOSCOPY  07/31/2011   Dr. Doyce Para segment Barrett's esophagus s/p bx Hiatal hernia. Duodenal diverticulum. No dysplasia.Next EGD 07/2014.  Marland Kitchen ESOPHAGOGASTRODUODENOSCOPY N/A 09/30/2014   Dr. Gala Romney: Abnormal distal esophagus consistant with prior diagnosis of short  segment Barrett's esophagus status post biopsy. 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 Hiatal hernia. Abnormal Gastric mucosa of uncertain significance status post gastric biopsy. path with +Barrett's esophagus but no dysplasia, mild chronic gastritis. Surveillance for Barrett's due in 2019  . ESOPHAGOGASTRODUODENOSCOPY N/A 01/06/2018   Procedure: ESOPHAGOGASTRODUODENOSCOPY (EGD);  Surgeon: Daneil Dolin, MD;  [facility];  Service: Endoscopy;  Laterality: N/A;  12:00pm  . ESOPHAGOGASTRODUODENOSCOPY (EGD) WITH PROPOFOL N/A 10/29/2018   Procedure: ESOPHAGOGASTRODUODENOSCOPY (EGD) WITH PROPOFOL;  Surgeon: Daneil Dolin, MD;  [facility];  Service: Endoscopy;  Laterality: N/A;  . EXPLORATORY LAPAROTOMY W/ BOWEL RESECTION  July 2014   [facility]: with lysis of adhesions, small bowel resection X 2, resection of mesenteric mass, appendectomy, path: fibromatosis   . 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 on 07/2015     Home Medications:  Allergies as of 11/22/2019      Reactions   Lisinopril Other (See Comments)   Pt unsure    Metformin Diarrhea   Enoxaparin Rash   Other reaction(s): Eruption of skin   Lovenox [enoxaparin Sodium] Hives, Rash   Penicillins Rash    Has patient had a PCN reaction causing immediate rash, facial/tongue/throat swelling, SOB or lightheadedness with hypotension: Yes Has patient had a PCN reaction causing severe rash involving mucus membranes or skin necrosis: No Has patient had a PCN reaction that required hospitalization No Has patient had a PCN reaction occurring within the last 10 years: No If all of the above answers are "NO", then may proceed with Cephalosporin use.      Medication List       Accurate as of November 22, 2019  4:00  PM. If you have any questions, ask your nurse or doctor.        amLODipine 5 MG tablet Commonly known as: NORVASC Take 5 mg by mouth daily.   aspirin 81 MG EC tablet What changed: Another medication with the same name was removed. Continue taking this medication, and follow the directions you see here. Changed by: Nicolette Bang, MD   carboxymethylcellulose 0.5 % Soln Commonly known as: REFRESH PLUS INSTILL ONE DROP IN BOTH EYES THREE TIMES A DAY   Cholecalciferol 25 MCG (1000 UT) tablet What changed: Another medication with the same name was removed. Continue taking this medication, and follow the directions you see here. Changed by: Nicolette Bang, MD   Crestor 20 MG tablet Generic drug: rosuvastatin Take 10 mg by mouth every morning.   rosuvastatin 10 MG tablet Commonly known as: CRESTOR Take 10 mg by mouth daily.   diazepam 5 MG tablet Commonly  known as: VALIUM Take 5 mg by mouth daily as needed for anxiety.   dicyclomine 10 MG capsule Commonly known as: BENTYL Take 1 capsule (10 mg total) by mouth 4 (four) times daily as needed for spasms (diarrhea).   DRY EYES OP Place 1 drop into both eyes 2 (two) times daily as needed (dry eyes).   ferrous sulfate 325 (65 FE) MG tablet Take 325 mg by mouth daily with breakfast.   Fish Oil 1000 MG Caps Take by mouth.   gabapentin 400 MG capsule Commonly known as: NEURONTIN Take 400 mg by mouth daily.   glipiZIDE 5 MG tablet Commonly known as: GLUCOTROL Take 5 mg by mouth daily before breakfast.   hydrocortisone 2.5 % rectal cream Commonly known as: ANUSOL-HC Place 1 application rectally 2 (two) times daily. For up to 10 days at a time   imatinib 400 MG tablet Commonly known as: GLEEVEC Take 1 tablet by mouth daily. What changed: Another medication with the same name was removed. Continue taking this medication, and follow the directions you see here. Changed by: Nicolette Bang, MD   Januvia 100 MG tablet  Generic drug: sitaGLIPtin Take 100 mg by mouth daily. What changed: Another medication with the same name was removed. Continue taking this medication, and follow the directions you see here. Changed by: Nicolette Bang, MD   losartan 100 MG tablet Commonly known as: COZAAR Take 50 mg by mouth daily.   Lovaza 1 g capsule Generic drug: omega-3 acid ethyl esters Take 1 capsule by mouth 2 (two) times daily.   metoprolol tartrate 50 MG tablet Commonly known as: LOPRESSOR Take 25 mg by mouth daily.   nitroGLYCERIN 0.4 MG SL tablet Commonly known as: NITROSTAT Place 0.4 mg under the tongue every 5 (five) minutes as needed for chest pain.   omeprazole 20 MG capsule Commonly known as: PRILOSEC Take 20 mg by mouth 2 (two) times daily before a meal.   QUEtiapine 50 MG tablet Commonly known as: SEROQUEL Take 50 mg by mouth at bedtime as needed for depression.   sertraline 100 MG tablet Commonly known as: ZOLOFT Take 100 mg by mouth daily as needed (PTSD).   sildenafil 100 MG tablet Commonly known as: VIAGRA Take by mouth.   tamsulosin 0.4 MG Caps capsule Commonly known as: FLOMAX Take 0.4 mg by mouth at bedtime.   vitamin C 250 MG tablet Commonly known as: ASCORBIC ACID Take 250 mg by mouth daily.       Allergies:  Allergies  Allergen Reactions  . Lisinopril Other (See Comments)    Pt unsure   . Metformin Diarrhea  . Enoxaparin Rash    Other reaction(s): Eruption of skin  . Lovenox [Enoxaparin Sodium] Hives and Rash  . Penicillins Rash     Has patient had a PCN reaction causing immediate rash, facial/tongue/throat swelling, SOB or lightheadedness with hypotension: Yes Has patient had a PCN reaction causing severe rash involving mucus membranes or skin necrosis: No Has patient had a PCN reaction that required hospitalization No Has patient had a PCN reaction occurring within the last 10 years: No If all of the above answers are "NO", then may proceed with  Cephalosporin use.     Family History: Family History  Problem Relation Age of Onset  . Heart disease Mother   . Suicidality Mother   . Heart attack Father        25  . Heart disease Father   . Heart disease Sister   .  Colon cancer Neg Hx     Social History:  reports that he has never smoked. He has never used smokeless tobacco. He reports that he does not drink alcohol and does not use drugs.  ROS: All other review of systems were reviewed and are negative except what is noted above in HPI  Physical Exam: BP (!) 186/68   Pulse 67   Temp 98.1 F (36.7 C)   Ht 5\' 9"  (1.753 m)   Wt 170 lb (77.1 kg)   BMI 25.10 kg/m   Constitutional:  Alert and oriented, No acute distress. HEENT: [place] AT, moist mucus membranes.  Trachea midline, no masses. Cardiovascular: No clubbing, cyanosis, or edema. Respiratory: Normal respiratory effort, no increased work of breathing. GI: Abdomen is soft, nontender, nondistended, no abdominal masses GU: No CVA tenderness. Circumcised phallus. No masses/lesions on penis, testis, scrotum. Prostate 40g smooth no nodules no induration.  Lymph: No cervical or inguinal lymphadenopathy. Skin: No rashes, bruises or suspicious lesions. Neurologic: Grossly intact, no focal deficits, moving all 4 extremities. Psychiatric: Normal mood and affect.  Laboratory Data: Lab Results  Component Value Date   WBC 8.9 07/12/2018   HGB 13.4 07/12/2018   HCT 41.4 07/12/2018   MCV 86.8 07/12/2018   PLT 191 07/12/2018    Lab Results  Component Value Date   CREATININE 1.14 10/26/2018    No results found for: PSA  No results found for: TESTOSTERONE  No results found for: HGBA1C  Urinalysis    Component Value Date/Time   COLORURINE YELLOW 07/12/2018 1158   [place] 07/12/2018 1158   LABSPEC 1.012 07/12/2018 1158   PHURINE 5.0 07/12/2018 1158   GLUCOSEU NEGATIVE 07/12/2018 1158   HGBUR NEGATIVE 07/12/2018 1158   BILIRUBINUR NEGATIVE 07/12/2018 1158    KETONESUR NEGATIVE 07/12/2018 1158   PROTEINUR NEGATIVE 07/12/2018 1158   UROBILINOGEN 0.2 03/29/2010 0803   NITRITE NEGATIVE 07/12/2018 1158   LEUKOCYTESUR NEGATIVE 07/12/2018 1158    Lab Results  Component Value Date   BACTERIA RARE (A) 05/16/2018    Pertinent Imaging:  No results found for this or any previous visit.  No results found for this or any previous visit.  No results found for this or any previous visit.  No results found for this or any previous visit.  No results found for this or any previous visit.  No results found for this or any previous visit.  No results found for this or any previous visit.  No results found for this or any previous visit.   Assessment & Plan:    1. Nocturia -switch flomax to uroxatral 10mg   2. Benign prostatic hyperplasia with urinary obstruction -switch flomax to uroxatral 10mg  qhs  3. Elevated PSA -We discussed the need for prostate biopsy given the significant increase in his PSA. He is scheduled for an MRI of the prostate at the [place]. He defers prostate biopsy until he gets a prostate MRI.    No follow-ups on file.  Nicolette Bang, MD  [facility] [place]

## 2019-11-22 NOTE — Patient Instructions (Signed)

## 2019-11-22 NOTE — Progress Notes (Signed)
Urological Symptom Review  Patient is experiencing the following symptoms: Frequent urination Get up at night to urinate  Erection problems   Review of Systems  Gastrointestinal (upper)  : Indigestion/heartburn  Gastrointestinal (lower) : Diarrhea Constipation  Constitutional : Negative for symptoms  Skin: Skin rash/lesion  Eyes: Negative for eye symptoms  Ear/Nose/Throat : Sinus problems  Hematologic/Lymphatic: Easy bruising  Cardiovascular : Leg swelling  Respiratory : Negative for respiratory symptoms  Endocrine: Negative for endocrine symptoms  Musculoskeletal: Back pain Joint pain  Neurological: Negative for neurological symptoms  Psychologic: Depression Anxiety

## 2019-11-26 ENCOUNTER — Telehealth: Payer: Self-pay

## 2019-11-26 ENCOUNTER — Other Ambulatory Visit (HOSPITAL_COMMUNITY): Payer: Self-pay | Admitting: Urology

## 2019-11-26 DIAGNOSIS — R972 Elevated prostate specific antigen [PSA]: Secondary | ICD-10-CM

## 2019-11-26 MED ORDER — LEVOFLOXACIN 750 MG PO TABS: 750.0000 mg | ORAL_TABLET | Freq: Every day | ORAL | 0 refills | Status: DC

## 2019-11-26 NOTE — Progress Notes (Signed)
Pt called with no answer. Message left to call office. Letter mailed with PSA results , appt for biopsy, and f/u appt.

## 2019-11-26 NOTE — Telephone Encounter (Signed)
-----   Message from [name], MD sent at 11/26/2019  9:04 AM EDT ----- PSa still elevated. Patient need prostate biopsy ----- Message ----- From: Iris Pert, LPN Sent: [id number]   8:38 AM EDT To: [name], MD  Please review

## 2019-11-26 NOTE — Telephone Encounter (Signed)
Pt. called with no answer psa results mailed, biopsy appt made, along with f/u appt. Message left for pt to call office.

## 2019-11-26 NOTE — Progress Notes (Signed)
Pt called with no answer.Message left to call office.  Letter mailed with PSA result, biopsy appt,, and f/u appt.

## 2019-11-27 NOTE — Telephone Encounter (Signed)
Patient returned the call today.  I advised him of his PSA results and the prostate biopsy appointment. He expressed understanding.   Patient also states that he is scheduled to have a MRI at the [place].  He will call back with that information.

## 2019-12-29 ENCOUNTER — Ambulatory Visit (HOSPITAL_COMMUNITY): Payer: Medicare PPO

## 2019-12-29 ENCOUNTER — Other Ambulatory Visit: Payer: Medicare PPO | Admitting: Urology

## 2020-01-12 ENCOUNTER — Ambulatory Visit: Payer: Medicare PPO | Admitting: Urology

## 2020-01-26 ENCOUNTER — Ambulatory Visit (INDEPENDENT_AMBULATORY_CARE_PROVIDER_SITE_OTHER): Payer: Medicare PPO | Admitting: Urology

## 2020-01-26 ENCOUNTER — Ambulatory Visit (HOSPITAL_COMMUNITY)
Admission: RE | Admit: 2020-01-26 | Discharge: 2020-01-26 | Disposition: A | Discharge status: Home / Self Care | Payer: Medicare PPO | Source: Ambulatory Visit | Attending: Urology | Admitting: Urology

## 2020-01-26 ENCOUNTER — Other Ambulatory Visit: Payer: Self-pay

## 2020-01-26 ENCOUNTER — Encounter (HOSPITAL_COMMUNITY): Payer: Self-pay

## 2020-01-26 ENCOUNTER — Other Ambulatory Visit: Payer: Self-pay | Admitting: Urology

## 2020-01-26 DIAGNOSIS — C61 Malignant neoplasm of prostate: Secondary | ICD-10-CM | POA: Diagnosis not present

## 2020-01-26 DIAGNOSIS — R972 Elevated prostate specific antigen [PSA]: Secondary | ICD-10-CM

## 2020-01-26 MED ORDER — CEFTRIAXONE SODIUM 1 G IJ SOLR: 1.0000 g | Freq: Once | INTRAMUSCULAR | Status: AC

## 2020-01-26 MED ORDER — LIDOCAINE HCL (PF) 2 % IJ SOLN
INTRAMUSCULAR | Status: AC
  Administered 2020-01-26: 10 mL
  Filled 2020-01-26: qty 10

## 2020-01-26 MED ORDER — GENTAMICIN SULFATE 40 MG/ML IJ SOLN: 80.0000 mg | Freq: Once | INTRAMUSCULAR | Status: DC

## 2020-01-26 MED ORDER — GENTAMICIN SULFATE 40 MG/ML IJ SOLN
INTRAMUSCULAR | Status: AC
  Filled 2020-01-26: qty 2

## 2020-01-26 MED ORDER — LIDOCAINE HCL (PF) 1 % IJ SOLN: 2.1000 mL | Freq: Once | INTRAMUSCULAR | Status: AC

## 2020-01-26 MED ORDER — LIDOCAINE HCL (PF) 1 % IJ SOLN
INTRAMUSCULAR | Status: AC
  Administered 2020-01-26: 2.1 mL
  Filled 2020-01-26: qty 5

## 2020-01-26 MED ORDER — CEFTRIAXONE SODIUM 1 G IJ SOLR
INTRAMUSCULAR | Status: AC
  Administered 2020-01-26: 1 g via INTRAMUSCULAR
  Filled 2020-01-26: qty 10

## 2020-01-31 ENCOUNTER — Encounter: Payer: Self-pay | Admitting: Urology

## 2020-01-31 NOTE — Patient Instructions (Signed)

## 2020-01-31 NOTE — Progress Notes (Signed)
Prostate Biopsy Procedure   Informed consent was obtained after discussing risks/benefits of the procedure.  A time out was performed to ensure correct patient identity.  Pre-Procedure: - Last PSA Level:  Lab Results  Component Value Date   PSA 12.0 (H) 11/22/2019   - Gentamicin given prophylactically - Levaquin 500 mg administered PO -Transrectal Ultrasound performed revealing a 36 gm prostate -No significant hypoechoic or median lobe noted  Procedure: - Prostate block performed using 10 cc 1% lidocaine and biopsies taken from sextant areas, a total of 12 under ultrasound guidance.  Post-Procedure: - Patient tolerated the procedure well - He was counseled to seek immediate medical attention if experiences any severe pain, significant bleeding, or fevers - Return in one week to discuss biopsy results

## 2020-02-09 ENCOUNTER — Other Ambulatory Visit: Payer: Self-pay

## 2020-02-09 ENCOUNTER — Ambulatory Visit (INDEPENDENT_AMBULATORY_CARE_PROVIDER_SITE_OTHER): Payer: Medicare PPO | Admitting: Urology

## 2020-02-09 ENCOUNTER — Encounter: Payer: Self-pay | Admitting: Urology

## 2020-02-09 VITALS — BP 181/73 | HR 83 | Temp 99.0°F | Ht 69.0 in | Wt 170.0 lb

## 2020-02-09 DIAGNOSIS — C61 Malignant neoplasm of prostate: Secondary | ICD-10-CM | POA: Diagnosis not present

## 2020-02-09 NOTE — Progress Notes (Signed)
02/09/2020 3:19 PM   Donald Klein 10-21-1945 [id number]  Referring provider: [name], MD [address] [place],  [place] [address]  followup prostate biopsy  HPI: [name] is a 74yo here for followup after prostate biopsy on 01/26/2020. Pathology revealed Gleason 3+3=6 in 4/12 cores and Gleason 3+4=7 in 4/12 cores. PSA 12 in 11/2019. He take Uroxatral 10mg  qhs and flomax 0.4mg  for ED.  Pathology report reviewed with the patient.   PMH: Past Medical History:  Diagnosis Date  . Anemia   . Anxiety   . Barrett esophagus    Last EGD 05/27/08 Barrett's without dyplasia. EGD due 05/2011  . Chronic back pain   . Colitis    In 1980s, dx with UC by Dr. Humphrey Rolls at time of TCS, 1992 TCS, chronic colitis  . Depression   . Diabetes mellitus   . GERD (gastroesophageal reflux disease)   . Helicobacter pylori gastritis 2009   treated  . HTN (hypertension)   . PTSD (post-traumatic stress disorder)   . Sleep apnea   . 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 Tubular adenoma of colon 10/08/10   Last colonosocpy 1 cecal TA    Surgical History: Past Surgical History:  Procedure Laterality Date  . APPENDECTOMY  July 2014   at time of small bowel resection  . BIOPSY  01/06/2018   Procedure: BIOPSY;  Surgeon: Daneil Dolin, MD;  [facility];  Service: Endoscopy;;  gastric biopsy , esophageal biopsy  . BIOPSY  10/29/2018   Procedure: BIOPSY;  Surgeon: Daneil Dolin, MD;  [facility];  Service: Endoscopy;;  gastric, esophageal   . CHOLECYSTECTOMY    . COLONOSCOPY  10/08/2010   Normal rectum/tubular adenoma/normal random biopsy. Next TCS due 10/2015.  Marland Kitchen COLONOSCOPY N/A 09/30/2014   Dr. Gala Romney: Redunant colon . Status post segmental biopsy and stool sampling. Negative stool samples and colonic biopsies.   . COLONOSCOPY WITH PROPOFOL N/A 10/29/2018   Procedure: COLONOSCOPY WITH PROPOFOL;  Surgeon:  Daneil Dolin, MD;  [facility];  Service: Endoscopy;  Laterality: N/A;  12:00pm  . ESOPHAGOGASTRODUODENOSCOPY  07/31/2011   Dr. Doyce Para segment Barrett's esophagus s/p bx Hiatal hernia. Duodenal diverticulum. No dysplasia.Next EGD 07/2014.  Marland Kitchen ESOPHAGOGASTRODUODENOSCOPY N/A 09/30/2014   Dr. Gala Romney: Abnormal distal esophagus consistant with prior diagnosis of short segment Barrett's esophagus status post biopsy. 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 Hiatal hernia. Abnormal Gastric mucosa of uncertain significance status post gastric biopsy. path with +Barrett's esophagus but no dysplasia, mild chronic gastritis. Surveillance for Barrett's due in 2019  . ESOPHAGOGASTRODUODENOSCOPY N/A 01/06/2018   Procedure: ESOPHAGOGASTRODUODENOSCOPY (EGD);  Surgeon: Daneil Dolin, MD;  [facility];  Service: Endoscopy;  Laterality: N/A;  12:00pm  . ESOPHAGOGASTRODUODENOSCOPY (EGD) WITH PROPOFOL N/A 10/29/2018   Procedure: ESOPHAGOGASTRODUODENOSCOPY (EGD) WITH PROPOFOL;  Surgeon: Daneil Dolin, MD;  [facility];  Service: Endoscopy;  Laterality: N/A;  . EXPLORATORY LAPAROTOMY W/ BOWEL RESECTION  July 2014   [facility]: with lysis of adhesions, small bowel resection X 2, resection of mesenteric mass, appendectomy, path: fibromatosis   . 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 on 07/2015     Home Medications:  Allergies as of  02/09/2020      Reactions   Lisinopril Other (See Comments)   Pt unsure    Metformin Diarrhea   Enoxaparin Rash   Other reaction(s): Eruption of skin   Lovenox [enoxaparin Sodium] Hives, Rash   Penicillins Rash    Has patient had a PCN reaction causing immediate rash, facial/tongue/throat swelling, SOB or lightheadedness with hypotension:  Yes Has patient had a PCN reaction causing severe rash involving mucus membranes or skin necrosis: No Has patient had a PCN reaction that required hospitalization No Has patient had a PCN reaction occurring within the last 10 years: No If all of the above answers are "NO", then may proceed with Cephalosporin use.      Medication List       Accurate as of February 09, 2020  3:19 PM. If you have any questions, ask your nurse or doctor.        alfuzosin 10 MG 24 hr tablet Commonly known as: UROXATRAL Take 1 tablet (10 mg total) by mouth daily with breakfast.   amLODipine 5 MG tablet Commonly known as: NORVASC Take 5 mg by mouth daily.   aspirin 81 MG EC tablet   carboxymethylcellulose 0.5 % Soln Commonly known as: REFRESH PLUS INSTILL ONE DROP IN BOTH EYES THREE TIMES A DAY   Cholecalciferol 25 MCG (1000 UT) tablet   Crestor 20 MG tablet Generic drug: rosuvastatin Take 10 mg by mouth every morning.   rosuvastatin 10 MG tablet Commonly known as: CRESTOR Take 10 mg by mouth daily.   diazepam 5 MG tablet Commonly known as: VALIUM Take 5 mg by mouth daily as needed for anxiety.   dicyclomine 10 MG capsule Commonly known as: BENTYL Take 1 capsule (10 mg total) by mouth 4 (four) times daily as needed for spasms (diarrhea).   DRY EYES OP Place 1 drop into both eyes 2 (two) times daily as needed (dry eyes).   ferrous sulfate 325 (65 FE) MG tablet Take 325 mg by mouth daily with breakfast.   Fish Oil 1000 MG Caps Take by mouth.   gabapentin 400 MG capsule Commonly known as: NEURONTIN Take 400 mg by mouth daily.   glipiZIDE 5 MG tablet Commonly known as: GLUCOTROL Take 5 mg by mouth daily before breakfast.   hydrocortisone 2.5 % rectal cream Commonly known as: ANUSOL-HC Place 1 application rectally 2 (two) times daily. For up to 10 days at a time   imatinib 400 MG tablet Commonly known as: GLEEVEC Take 1 tablet by mouth daily.   Januvia 100 MG tablet Generic  drug: sitaGLIPtin Take 100 mg by mouth daily.   levofloxacin 750 MG tablet Commonly known as: Levaquin Take 1 tablet (750 mg total) by mouth daily.   losartan 100 MG tablet Commonly known as: COZAAR Take 50 mg by mouth daily.   Lovaza 1 g capsule Generic drug: omega-3 acid ethyl esters Take 1 capsule by mouth 2 (two) times daily.   metoprolol tartrate 50 MG tablet Commonly known as: LOPRESSOR Take 25 mg by mouth daily.   nitroGLYCERIN 0.4 MG SL tablet Commonly known as: NITROSTAT Place 0.4 mg under the tongue every 5 (five) minutes as needed for chest pain.   omeprazole 20 MG capsule Commonly known as: PRILOSEC Take 20 mg by mouth 2 (two) times daily before a meal.   QUEtiapine 50 MG tablet Commonly known as: SEROQUEL Take 50 mg by mouth at bedtime as needed for depression.   sertraline 100 MG tablet Commonly known as: ZOLOFT  Take 100 mg by mouth daily as needed (PTSD).   sildenafil 100 MG tablet Commonly known as: VIAGRA Take by mouth.   tamsulosin 0.4 MG Caps capsule Commonly known as: FLOMAX Take 1 capsule (0.4 mg total) by mouth at bedtime.   vitamin C 250 MG tablet Commonly known as: ASCORBIC ACID Take 250 mg by mouth daily.       Allergies:  Allergies  Allergen Reactions  . Lisinopril Other (See Comments)    Pt unsure   . Metformin Diarrhea  . Enoxaparin Rash    Other reaction(s): Eruption of skin  . Lovenox [Enoxaparin Sodium] Hives and Rash  . Penicillins Rash     Has patient had a PCN reaction causing immediate rash, facial/tongue/throat swelling, SOB or lightheadedness with hypotension: Yes Has patient had a PCN reaction causing severe rash involving mucus membranes or skin necrosis: No Has patient had a PCN reaction that required hospitalization No Has patient had a PCN reaction occurring within the last 10 years: No If all of the above answers are "NO", then may proceed with Cephalosporin use.     Family History: Family History   Problem Relation Age of Onset  . Heart disease Mother   . Suicidality Mother   . Heart attack Father        5  . Heart disease Father   . Heart disease Sister   . Colon cancer Neg Hx     Social History:  reports that he has never smoked. He has never used smokeless tobacco. He reports that he does not drink alcohol and does not use drugs.  ROS: All other review of systems were reviewed and are negative except what is noted above in HPI  Physical Exam: BP (!) 181/73   Pulse 83   Temp 99 F (37.2 C)   Ht 5\' 9"  (1.753 m)   Wt 170 lb (77.1 kg)   BMI 25.10 kg/m   Constitutional:  Alert and oriented, No acute distress. HEENT: [place] AT, moist mucus membranes.  Trachea midline, no masses. Cardiovascular: No clubbing, cyanosis, or edema. Respiratory: Normal respiratory effort, no increased work of breathing. GI: Abdomen is soft, nontender, nondistended, no abdominal masses GU: No CVA tenderness.  Lymph: No cervical or inguinal lymphadenopathy. Skin: No rashes, bruises or suspicious lesions. Neurologic: Grossly intact, no focal deficits, moving all 4 extremities. Psychiatric: Normal mood and affect.  Laboratory Data: Lab Results  Component Value Date   WBC 8.9 07/12/2018   HGB 13.4 07/12/2018   HCT 41.4 07/12/2018   MCV 86.8 07/12/2018   PLT 191 07/12/2018    Lab Results  Component Value Date   CREATININE 1.14 10/26/2018    Lab Results  Component Value Date   PSA 12.0 (H) 11/22/2019    No results found for: TESTOSTERONE  No results found for: HGBA1C  Urinalysis    Component Value Date/Time   COLORURINE YELLOW 07/12/2018 1158   Pala 07/12/2018 1158   LABSPEC 1.012 07/12/2018 1158   PHURINE 5.0 07/12/2018 1158   GLUCOSEU NEGATIVE 07/12/2018 1158   HGBUR NEGATIVE 07/12/2018 1158   BILIRUBINUR NEGATIVE 07/12/2018 1158   KETONESUR NEGATIVE 07/12/2018 1158   PROTEINUR NEGATIVE 07/12/2018 1158   UROBILINOGEN 0.2 03/29/2010 0803   NITRITE NEGATIVE  07/12/2018 1158   LEUKOCYTESUR NEGATIVE 07/12/2018 1158    Lab Results  Component Value Date   BACTERIA RARE (A) 05/16/2018    Pertinent Imaging:  No results found for this or any previous visit.  No results  found for this or any previous visit.  No results found for this or any previous visit.  No results found for this or any previous visit.  No results found for this or any previous visit.  No results found for this or any previous visit.  No results found for this or any previous visit.  No results found for this or any previous visit.   Assessment & Plan:    1. Prostate cancer [facility]) I discussed the natural history of intermediate risk prostate cancer with the patient and the various treatment options including active surveillance, RALP, IMRT, brachytherapy, cryotherapy, HIFU and ADT. After discussing the options the patient elects for brachytherapy versus IMRT. I will have him see Dr Tammi Klippel.    No follow-ups on file.  Nicolette Bang, MD  [facility] [place]

## 2020-02-09 NOTE — Patient Instructions (Signed)
Prostate Cancer  The prostate is a male gland that helps make semen. Prostate cancer is when abnormal cells grow in this gland. Follow these instructions at home:  Take over-the-counter and prescription medicines only as told by your doctor.  Eat a healthy diet.  Get plenty of sleep.  Ask your doctor for help to find a support group for men with prostate cancer.  Keep all follow-up visits as told by your doctor. This is important.  If you have to go to the hospital, let your cancer doctor (oncologist) know.  Touch, hold, hug, and caress your partner to continue to show sexual feelings. Contact a doctor if:  You have trouble peeing (urinating).  You have blood in your pee (urine).  You have pain in your hips, back, or chest. Get help right away if:  You have weakness in your legs.  You lose feeling (have numbness) in your legs.  You cannot control your pee or your poop (stool).  You have trouble breathing.  You have sudden pain in your chest.  You have chills or a fever. Summary  The prostate is a male gland that helps make semen. Prostate cancer is when abnormal cells grow in this gland.  Ask your doctor for help to find a support group for men with prostate cancer.  Contact a doctor if you have problems peeing or have any new pain that you did not have before. This information is not intended to replace advice given to you by your health care provider. Make sure you discuss any questions you have with your health care provider. Document Revised: 04/04/2017 Document Reviewed: 01/01/2016 Elsevier Patient Education  2020 [employer]  

## 2020-02-10 LAB — BASIC METABOLIC PANEL
BUN/Creatinine Ratio: 14 (ref 10–24)
BUN: 17 mg/dL (ref 8–27)
CO2: 25 mmol/L (ref 20–29)
Calcium: 9.3 mg/dL (ref 8.6–10.2)
Chloride: 106 mmol/L (ref 96–106)
Creatinine, Ser: 1.24 mg/dL (ref 0.76–1.27)
GFR calc Af Amer: 65 mL/min/{1.73_m2} (ref >59)
GFR calc non Af Amer: 56 mL/min/{1.73_m2} — ABNORMAL LOW (ref >59)
Glucose: 116 mg/dL — ABNORMAL HIGH (ref 65–99)
Potassium: 4.4 mmol/L (ref 3.5–5.2)
Sodium: 143 mmol/L (ref 134–144)

## 2020-02-22 ENCOUNTER — Other Ambulatory Visit: Payer: Self-pay

## 2020-02-22 ENCOUNTER — Encounter (HOSPITAL_COMMUNITY)
Admission: RE | Admit: 2020-02-22 | Discharge: 2020-02-22 | Disposition: A | Discharge status: Home / Self Care | Payer: Medicare PPO | Source: Ambulatory Visit | Attending: Urology | Admitting: Urology

## 2020-02-22 ENCOUNTER — Encounter (HOSPITAL_COMMUNITY): Payer: Self-pay

## 2020-02-22 DIAGNOSIS — C61 Malignant neoplasm of prostate: Secondary | ICD-10-CM | POA: Diagnosis present

## 2020-02-22 MED ORDER — TECHNETIUM TC 99M MEDRONATE IV KIT
20.0000 | PACK | Freq: Once | INTRAVENOUS | Status: AC | PRN
  Administered 2020-02-22: 22 via INTRAVENOUS

## 2020-02-24 ENCOUNTER — Ambulatory Visit: Payer: Medicare PPO | Admitting: Urology

## 2020-02-28 ENCOUNTER — Telehealth: Payer: Self-pay | Admitting: Urology

## 2020-02-28 NOTE — Telephone Encounter (Signed)
Pt came by results of bone scan.

## 2020-02-28 NOTE — Telephone Encounter (Signed)
Pt stopped by wanting to get the results of his bone scan.

## 2020-03-01 ENCOUNTER — Ambulatory Visit (HOSPITAL_COMMUNITY)
Admission: RE | Admit: 2020-03-01 | Discharge: 2020-03-01 | Disposition: A | Discharge status: Home / Self Care | Payer: Medicare PPO | Source: Ambulatory Visit | Attending: Urology | Admitting: Urology

## 2020-03-01 ENCOUNTER — Encounter: Payer: Self-pay | Admitting: Radiation Oncology

## 2020-03-01 ENCOUNTER — Other Ambulatory Visit: Payer: Self-pay

## 2020-03-01 DIAGNOSIS — C61 Malignant neoplasm of prostate: Secondary | ICD-10-CM | POA: Insufficient documentation

## 2020-03-01 MED ORDER — IOHEXOL 300 MG/ML  SOLN
100.0000 mL | Freq: Once | INTRAMUSCULAR | Status: AC | PRN
  Administered 2020-03-01: 100 mL via INTRAVENOUS

## 2020-03-01 NOTE — Progress Notes (Signed)
GU Location of Tumor / Histology: prostatic adenocarcinoma  If Prostate Cancer, Gleason Score is (3 + 4) and PSA is (12). Prostate volume: 36 g.    Biopsies of prostate (if applicable) revealed:   Past/Anticipated interventions by urology, if any: prostate biopsy, prescribed flomax then uroxatral, referral to Dr. Tammi Klippel to discuss IMRT vs brachytherapy  Past/Anticipated interventions by medical oncology, if any: no  Weight changes, if any: no  Bowel/Bladder complaints, if any: IPSS 13. SHIM 13. Denies dysuria or hematuria. Reports nocturia x 3-4.  Reports urinary urgency. Reports some days he struggles with urinary frequency and other days not but he relates this to "sugar." Denies any bowel complaints. Reports taking GLEEVEC for the tumor that is growing on the outside of his stomach.   Nausea/Vomiting, if any: no  Pain issues, if any:  Reports chronic back pain.  SAFETY ISSUES:  Prior radiation? denies  Pacemaker/ICD? denies  Possible current pregnancy? no, male patient  Is the patient on methotrexate? denies  Current Complaints / other details:  74 year old male. Married with one child. Brother with history of prostate ca. Patient reports his brother got seeds "and that's what he is here to see if he can get."  Patient reports he was exposed to agent orange in [place]. Patient reports his brother who has prostate cancer did not serve in the [employer].   Patient questions if he can take uroxatral, tamsulosin and viagra together. Patient questions his recent scan results.

## 2020-03-02 NOTE — Telephone Encounter (Signed)
-----   Message from [name], MD sent at 03/01/2020  6:35 PM EDT ----- Negative CT ----- Message ----- From: Valentina Lucks, LPN Sent: [id number]/[id number]   4:18 PM EDT To: [name], MD  Pls review.

## 2020-03-03 ENCOUNTER — Encounter: Payer: Self-pay | Admitting: Medical Oncology

## 2020-03-03 ENCOUNTER — Ambulatory Visit
Admission: RE | Admit: 2020-03-03 | Discharge: 2020-03-03 | Disposition: A | Discharge status: Home / Self Care | Payer: Medicare PPO | Source: Ambulatory Visit | Attending: Radiation Oncology | Admitting: Radiation Oncology

## 2020-03-03 ENCOUNTER — Encounter: Payer: Self-pay | Admitting: Radiation Oncology

## 2020-03-03 ENCOUNTER — Other Ambulatory Visit: Payer: Self-pay

## 2020-03-03 VITALS — BP 194/72 | HR 64 | Temp 97.7°F | Resp 18 | Ht 69.0 in | Wt 177.0 lb

## 2020-03-03 DIAGNOSIS — Z79899 Other long term (current) drug therapy: Secondary | ICD-10-CM | POA: Insufficient documentation

## 2020-03-03 DIAGNOSIS — K219 Gastro-esophageal reflux disease without esophagitis: Secondary | ICD-10-CM | POA: Insufficient documentation

## 2020-03-03 DIAGNOSIS — C49A0 Gastrointestinal stromal tumor, unspecified site: Secondary | ICD-10-CM | POA: Insufficient documentation

## 2020-03-03 DIAGNOSIS — G4730 Sleep apnea, unspecified: Secondary | ICD-10-CM | POA: Insufficient documentation

## 2020-03-03 DIAGNOSIS — F4310 Post-traumatic stress disorder, unspecified: Secondary | ICD-10-CM | POA: Insufficient documentation

## 2020-03-03 DIAGNOSIS — F418 Other specified anxiety disorders: Secondary | ICD-10-CM | POA: Diagnosis not present

## 2020-03-03 DIAGNOSIS — Z7982 Long term (current) use of aspirin: Secondary | ICD-10-CM | POA: Insufficient documentation

## 2020-03-03 DIAGNOSIS — Z8719 Personal history of other diseases of the digestive system: Secondary | ICD-10-CM | POA: Diagnosis not present

## 2020-03-03 DIAGNOSIS — E119 Type 2 diabetes mellitus without complications: Secondary | ICD-10-CM | POA: Insufficient documentation

## 2020-03-03 DIAGNOSIS — I700 Atherosclerosis of aorta: Secondary | ICD-10-CM | POA: Insufficient documentation

## 2020-03-03 DIAGNOSIS — I10 Essential (primary) hypertension: Secondary | ICD-10-CM | POA: Diagnosis not present

## 2020-03-03 DIAGNOSIS — C61 Malignant neoplasm of prostate: Secondary | ICD-10-CM

## 2020-03-03 HISTORY — DX: Malignant neoplasm of prostate: C61

## 2020-03-03 NOTE — Progress Notes (Signed)
Radiation Oncology         [phone]) [phone] ________________________________  Initial outpatient Consultation  Name: Donald Klein MRN: [id number]  Date: 03/03/2020  DOB: 1945-07-30  CC:Kim, Jeneen Rinks, MD  McKenzie, Candee Furbish, MD   REFERRING PHYSICIAN: [name], MD  DIAGNOSIS: 74 y.o. gentleman with Stage T1c adenocarcinoma of the prostate with Gleason score of 3+4, and PSA of 12.    ICD-10-CM   1. Prostate cancer [facility])  [place] [facility] referral to Social Work    HISTORY OF PRESENT ILLNESS: Donald Klein is a 74 y.o. male with a diagnosis of prostate cancer. He has a history of Agent Orange exposure and elevated PSA, previously followed at the [facility].  More recently, he was noted to have an elevated PSA of 15.5 by his primary care physician, Dr. Maudie Mercury.  His previous PSA in 11/2018 was 5.9. Accordingly, he was referred for evaluation in urology by Dr. Alyson Ingles on 11/22/2019,  digital rectal examination was performed at that time revealing no nodules.  Repeat PSA from that day had declined slightly to 12. The patient proceeded to transrectal ultrasound with 12 biopsies of the prostate on 01/26/2020.  The prostate volume measured 36 cc.  Out of 12 core biopsies, 8 were positive.  The maximum Gleason score was 3+4, and this was seen in right base lateral, right mid (small focus), left apex, and left apex lateral.  Additionally, Gleason 3+3 was seen in left mid lateral, left base (small focus), right apex, and right apex lateral.  He underwent bone scan on 02/22/2020, which was severely limited due to overall poor uptake of tracer within osseous structures. Within the limitation, a single focus of abnormal increased tracer uptake was seen at a lower posterior right rib (question 8th or 9th), nonspecific and could been seen with fracture or metastatic disease.  He also underwent CT A/P on 03/01/2020 showing no definitive findings to suggest metastatic disease.  Of note, the patient also has a  history of GIST tumor, s/p surgery and chemotherapy, currently on Gleevac.  The patient reviewed the biopsy results with his urologist and he has kindly been referred today for discussion of potential radiation treatment options.   PREVIOUS RADIATION THERAPY: No  PAST MEDICAL HISTORY:  Past Medical History:  Diagnosis Date  . Anemia   . Anxiety   . Barrett esophagus    Last EGD 05/27/08 Barrett's without dyplasia. EGD due 05/2011  . Chronic back pain   . Colitis    In 1980s, dx with UC by Dr. Humphrey Rolls at time of TCS, 1992 TCS, chronic colitis  . Depression   . Diabetes mellitus   . GERD (gastroesophageal reflux disease)   . Helicobacter pylori gastritis 2009   treated  . HTN (hypertension)   . Prostate cancer ([place])   . PTSD (post-traumatic stress disorder)   . Sleep apnea   . 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 Tubular adenoma of colon 10/08/10   Last colonosocpy 1 cecal TA      PAST SURGICAL HISTORY: Past Surgical History:  Procedure Laterality Date  . APPENDECTOMY  July 2014   at time of small bowel resection  . BIOPSY  01/06/2018   Procedure: BIOPSY;  Surgeon: Daneil Dolin, MD;  [facility];  Service: Endoscopy;;  gastric biopsy , esophageal biopsy  . BIOPSY  10/29/2018   Procedure: BIOPSY;  Surgeon: Daneil Dolin, MD;  [facility];  Service: Endoscopy;;  gastric, esophageal   . CHOLECYSTECTOMY    . COLONOSCOPY  10/08/2010   Normal rectum/tubular adenoma/normal random biopsy. Next TCS due 10/2015.  Marland Kitchen COLONOSCOPY N/A 09/30/2014   Dr. Gala Romney: Redunant colon . Status post segmental biopsy and stool sampling. Negative stool samples and colonic biopsies.   . COLONOSCOPY WITH PROPOFOL N/A 10/29/2018   Procedure: COLONOSCOPY WITH PROPOFOL;  Surgeon: Daneil Dolin, MD;  [facility];  Service: Endoscopy;  Laterality: N/A;  12:00pm  . ESOPHAGOGASTRODUODENOSCOPY  07/31/2011   Dr.  Doyce Para segment Barrett's esophagus s/p bx Hiatal hernia. Duodenal diverticulum. No dysplasia.Next EGD 07/2014.  Marland Kitchen ESOPHAGOGASTRODUODENOSCOPY N/A 09/30/2014   Dr. Gala Romney: Abnormal distal esophagus consistant with prior diagnosis of short segment Barrett's esophagus status post biopsy. 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 Hiatal hernia. Abnormal Gastric mucosa of uncertain significance status post gastric biopsy. path with +Barrett's esophagus but no dysplasia, mild chronic gastritis. Surveillance for Barrett's due in 2019  . ESOPHAGOGASTRODUODENOSCOPY N/A 01/06/2018   Procedure: ESOPHAGOGASTRODUODENOSCOPY (EGD);  Surgeon: Daneil Dolin, MD;  [facility];  Service: Endoscopy;  Laterality: N/A;  12:00pm  . ESOPHAGOGASTRODUODENOSCOPY (EGD) WITH PROPOFOL N/A 10/29/2018   Procedure: ESOPHAGOGASTRODUODENOSCOPY (EGD) WITH PROPOFOL;  Surgeon: Daneil Dolin, MD;  [facility];  Service: Endoscopy;  Laterality: N/A;  . EXPLORATORY LAPAROTOMY W/ BOWEL RESECTION  July 2014   [facility]: with lysis of adhesions, small bowel resection X 2, resection of mesenteric mass, appendectomy, path: fibromatosis   . 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 PROSTATE BIOPSY      FAMILY HISTORY:  Family History  Problem Relation Age of Onset  . Heart disease Mother   . Suicidality Mother   . Heart attack Father        31  . Heart disease Father   . Heart disease Sister   . Prostate cancer Brother   . Colon cancer Neg Hx   . Breast cancer Neg Hx   . Pancreatic cancer Neg Hx     SOCIAL HISTORY:  Social History   Socioeconomic History  . Marital status: Married    Spouse name: Not on file  . Number of children: 1  . Years of education: Not on file  . Highest  education level: Not on file  Occupational History  . Occupation: disability  Tobacco Use  . Smoking status: Never Smoker  . Smokeless tobacco: Never Used  Vaping Use  . Vaping Use: Never used  Substance and Sexual Activity  . Alcohol use: No    Alcohol/week: 0.0 standard drinks  . Drug use: No  . Sexual activity: Yes    Partners: Female    Birth control/protection: None    Comment: spouse  Other Topics Concern  . Not on file  Social History Narrative  . Not on file   Social Determinants of Health   Financial Resource Strain:   . Difficulty of Paying Living Expenses: Not on file  Food Insecurity:   . Worried About Charity fundraiser in the Last Year: Not on file  . Ran Out of Food in the Last Year: Not on file  Transportation Needs:   . Lack of Transportation (Medical): Not on file  . Lack of Transportation (Non-Medical): Not  on file  Physical Activity:   . Days of Exercise per Week: Not on file  . Minutes of Exercise per Session: Not on file  Stress:   . Feeling of Stress : Not on file  Social Connections:   . Frequency of Communication with Friends and Family: Not on file  . Frequency of Social Gatherings with Friends and Family: Not on file  . Attends Religious Services: Not on file  . Active Member of Clubs or Organizations: Not on file  . Attends Archivist Meetings: Not on file  . Marital Status: Not on file  Intimate Partner Violence:   . Fear of Current or Ex-Partner: Not on file  . Emotionally Abused: Not on file  . Physically Abused: Not on file  . Sexually Abused: Not on file    ALLERGIES: Lisinopril, Metformin, Enoxaparin, Lovenox [enoxaparin sodium], and Penicillins  MEDICATIONS:  Current Outpatient Medications  Medication Sig Dispense Refill  . alfuzosin (UROXATRAL) 10 MG 24 hr tablet Take 1 tablet (10 mg total) by mouth daily with breakfast. 30 tablet 11  . Artificial Tear Ointment (DRY EYES OP) Place 1 drop into both eyes 2 (two)  times daily as needed (dry eyes).     Marland Kitchen aspirin 81 MG EC tablet     . carboxymethylcellulose (REFRESH PLUS) 0.5 % SOLN INSTILL ONE DROP IN BOTH EYES THREE TIMES A DAY    . Cholecalciferol 25 MCG (1000 UT) tablet     . diazepam (VALIUM) 5 MG tablet Take 5 mg by mouth daily as needed for anxiety.    . dicyclomine (BENTYL) 10 MG capsule Take 1 capsule (10 mg total) by mouth 4 (four) times daily as needed for spasms (diarrhea). 90 capsule 3  . ferrous sulfate 325 (65 FE) MG tablet Take 325 mg by mouth daily with breakfast.    . gabapentin (NEURONTIN) 400 MG capsule Take 400 mg by mouth daily.    . hydrocortisone (ANUSOL-HC) 2.5 % rectal cream Place 1 application rectally 2 (two) times daily. For up to 10 days at a time 30 g 2  . imatinib (GLEEVEC) 400 MG tablet Take 1 tablet by mouth daily.    Marland Kitchen JANUVIA 100 MG tablet Take 100 mg by mouth daily.    Marland Kitchen LOVAZA 1 G capsule Take 1 capsule by mouth 2 (two) times daily.     . Omega-3 Fatty Acids (FISH OIL) 1000 MG CAPS Take by mouth.    Marland Kitchen omeprazole (PRILOSEC) 20 MG capsule Take 20 mg by mouth 2 (two) times daily before a meal.    . QUEtiapine (SEROQUEL) 50 MG tablet Take 50 mg by mouth at bedtime as needed for depression.    . sertraline (ZOLOFT) 100 MG tablet Take 100 mg by mouth daily as needed (PTSD).     Marland Kitchen sildenafil (VIAGRA) 100 MG tablet Take by mouth.    . vitamin C (ASCORBIC ACID) 250 MG tablet Take 250 mg by mouth daily.    Marland Kitchen amLODipine (NORVASC) 5 MG tablet Take 5 mg by mouth daily. (Patient not taking: Reported on 03/03/2020)    . losartan (COZAAR) 100 MG tablet Take 50 mg by mouth daily. (Patient not taking: Reported on 03/03/2020)    . metoprolol (LOPRESSOR) 50 MG tablet Take 25 mg by mouth daily.  (Patient not taking: Reported on 03/03/2020)    . nitroGLYCERIN (NITROSTAT) 0.4 MG SL tablet Place 0.4 mg under the tongue every 5 (five) minutes as needed for chest pain. (Patient not taking:  Reported on 03/03/2020)    . rosuvastatin (CRESTOR) 10  MG tablet Take 10 mg by mouth daily. (Patient not taking: Reported on 03/03/2020)    . tamsulosin (FLOMAX) 0.4 MG CAPS capsule Take 1 capsule (0.4 mg total) by mouth at bedtime. (Patient not taking: Reported on 03/03/2020) 90 capsule 3   No current facility-administered medications for this encounter.    REVIEW OF SYSTEMS:  On review of systems, the patient reports that he is doing well overall. He denies any chest pain, shortness of breath, cough, fevers, chills, night sweats, unintended weight changes. He denies any bowel disturbances, and denies abdominal pain, nausea or vomiting. He denies any new musculoskeletal or joint aches or pains but does report that he has intermittent discomfort in the right ribs, on and off for years. He recall having an injury to the right chest when he fell off a lawnmower several years ago.  His IPSS was 13, indicating moderate urinary symptoms. He reports nocturia x3-4, urinary urgency, and sometimes urinary frequency. His SHIM was 13, indicating he has moderate erectile dysfunction. A complete review of systems is obtained and is otherwise negative.    PHYSICAL EXAM:  Wt Readings from Last 3 Encounters:  03/03/20 177 lb (80.3 kg)  02/09/20 170 lb (77.1 kg)  11/22/19 170 lb (77.1 kg)   Temp Readings from Last 3 Encounters:  03/03/20 97.7 F (36.5 C) (Temporal)  02/09/20 99 F (37.2 C)  01/26/20 98.1 F (36.7 C) (Oral)   BP Readings from Last 3 Encounters:  03/03/20 (!) 194/72  02/09/20 (!) 181/73  01/26/20 (!) 187/73   Pulse Readings from Last 3 Encounters:  03/03/20 64  02/09/20 83  01/26/20 72   Pain Assessment Pain Score: 0-No pain/10  In general this is a well appearing African Americ[place] male in no acute distress. He is alert and oriented x4 and appropriate throughout the examination. HEENT reveals that the patient is normocephalic, atraumatic. EOMs are intact. PERRLA. Skin is intact without any evidence of gross lesions. Cardiovascular exam  reveals a regular rate and rhythm, no clicks rubs or murmurs are auscultated. Chest is clear to auscultation bilaterally. Lymphatic assessment is performed and does not reveal any adenopathy in the cervical, supraclavicular, axillary, or inguinal chains. Abdomen has active bowel sounds in all quadrants and is intact. The abdomen is soft, non tender, non distended. Lower extremities are negative for pretibial pitting edema, deep calf tenderness, cyanosis or clubbing.   KPS = 90  100 - Normal; no complaints; no evidence of disease. 90   - Able to carry on normal activity; minor signs or symptoms of disease. 80   - Normal activity with effort; some signs or symptoms of disease. 7   - Cares for self; unable to carry on normal activity or to do active work. 60   - Requires occasional assistance, but is able to care for most of his personal needs. 50   - Requires considerable assistance and frequent medical care. 39   - Disabled; requires special care and assistance. 53   - Severely disabled; hospital admission is indicated although death not imminent. 59   - Very sick; hospital admission necessary; active supportive treatment necessary. 10   - Moribund; fatal processes progressing rapidly. 0     - Dead  Karnofsky DA, Abelmann WH, Craver LS and Burchenal [facility] [phone]) The use of the nitrogen mustards in the palliative treatment of carcinoma: with particular reference to bronchogenic carcinoma Cancer 1 634-56  LABORATORY DATA:  Lab Results  Component Value Date   WBC 8.9 07/12/2018   HGB 13.4 07/12/2018   HCT 41.4 07/12/2018   MCV 86.8 07/12/2018   PLT 191 07/12/2018   Lab Results  Component Value Date   NA 143 02/09/2020   K 4.4 02/09/2020   CL 106 02/09/2020   CO2 25 02/09/2020   Lab Results  Component Value Date   ALT 47 (H) 05/16/2018   AST 39 05/16/2018   ALKPHOS 48 05/16/2018   BILITOT 0.5 05/16/2018     RADIOGRAPHY: CT Abdomen Pelvis W Wo Contrast  Result Date:  03/01/2020 CLINICAL DATA:  74 year old male with history of prostate cancer. Elevated PSA levels. EXAM: CT ABDOMEN AND PELVIS WITHOUT AND WITH CONTRAST TECHNIQUE: Multidetector CT imaging of the abdomen and pelvis was performed following the standard protocol before and following the bolus administration of intravenous contrast. CONTRAST:  129m OMNIPAQUE IOHEXOL 300 MG/ML  SOLN COMPARISON:  CT the abdomen and pelvis 05/16/2018. FINDINGS: Lower chest: Aortic atherosclerosis. Mild linear scarring in the lower lobes of the lungs bilaterally. Hepatobiliary: No suspicious cystic or solid hepatic lesions. No intra or extrahepatic biliary ductal dilatation. Status post cholecystectomy. Pancreas: No pancreatic mass. No pancreatic ductal dilatation. No pancreatic or peripancreatic fluid collections or inflammatory changes. Spleen: Unremarkable. Adrenals/Urinary Tract: 2.3 cm low-attenuation lesion in the upper pole of the left kidney compatible with a simple cyst. Bilateral kidneys and adrenal glands are otherwise normal in appearance. No hydroureteronephrosis. Urinary bladder is normal in appearance. Stomach/Bowel: Normal appearance of the stomach. Postoperative changes of partial small bowel resection. No pathologic dilatation of small bowel or colon. The appendix is not confidently identified and may be surgically absent. Regardless, there are no inflammatory changes noted adjacent to the cecum to suggest the presence of an acute appendicitis at this time. Vascular/Lymphatic: Aortic atherosclerosis, without evidence of aneurysm or dissection in the abdominal or pelvic vasculature. No lymphadenopathy noted in the abdomen or pelvis. Reproductive: Prostate gland and seminal vesicles are grossly unremarkable in appearance. Other: No significant volume of ascites.  No pneumoperitoneum. Musculoskeletal: There are no aggressive appearing lytic or blastic lesions noted in the visualized portions of the skeleton. IMPRESSION: 1.  No definitive findings to suggest metastatic disease in the abdomen or pelvis. 2. Aortic atherosclerosis. 3. Additional incidental findings, as above. Electronically Signed   By: DVinnie LangtonM.D.   On: 03/01/2020 16:07   NM Bone Scan Whole Body  Result Date: 02/22/2020 CLINICAL DATA:  Prostate cancer staging EXAM: NUCLEAR MEDICINE WHOLE BODY BONE SCAN TECHNIQUE: Whole body anterior and posterior images were obtained approximately 3 hours after intravenous injection of radiopharmaceutical. RADIOPHARMACEUTICALS:  22 mCi Technetium-940mDP IV COMPARISON:  None Radiographic correlation: None recent FINDINGS: Partial tracer extravasation at LEFT antecubital fossa injection site. Diffusely poor osseous uptake of tracer severely limiting exam. Single focus of abnormal tracer uptake is seen at a posterior lower RIGHT rib, cannot exclude metastasis. Uptake at the shoulders and sternoclavicular joints, likely degenerative. No additional sites of worrisome tracer accumulation are seen. Expected urinary tract distribution of tracer. Abnormal diffuse soft tissue retention of tracer. IMPRESSION: Overall poor uptake of tracer within osseous structures, severely limiting study. I suspect this is related to partial extravasation of the tracer dose at the LEFT antecubital fossa with resultant high retained soft tissue background over the course of the exam. A single focus of abnormal increased tracer uptake is seen at a lower posterior RIGHT rib, question eighth or ninth, nonspecific, could be seen from fracture  and from metastatic disease. Remainder of exam is severely limited but otherwise grossly unremarkable. If patient has high clinical suspicion of osseous metastatic disease, consider either repeat radionuclide imaging or CT. Electronically Signed   By: Lavonia Dana M.D.   On: 02/22/2020 12:48      IMPRESSION/PLAN: 1. 74 y.o. gentleman with Stage T1c adenocarcinoma of the prostate with Gleason Score of 3+4, and PSA  of 12. We discussed the patient's workup and outlined the nature of prostate cancer in this setting. The patient's T stage, Gleason's score, and PSA put him into the intermediate risk group. Accordingly, he is eligible for a variety of potential treatment options including brachytherapy, 5.5 weeks of external radiation, or prostatectomy. We discussed the available radiation techniques, and focused on the details and logistics of delivery. We discussed and outlined the risks, benefits, short and long-term effects associated with radiotherapy and compared and contrasted these with prostatectomy. We discussed the role of SpaceOAR gel in reducing the rectal toxicity associated with radiotherapy.  He appears to have a good understanding of his disease and our treatment recommendations which are of curative intent.  He was encouraged to ask questions that were answered to his stated satisfaction.  At the conclusion of our conversation, the patient is interested in moving forward with brachytherapy and use of SpaceOAR gel to reduce rectal toxicity from radiotherapy.  We will share our discussion with Dr. Alyson Ingles and move forward with scheduling his CT [facility] planning appointment in the near future.  The patient met briefly with Romie Jumper in our office who will be working closely with him to coordinate OR scheduling and pre and post procedure appointments.  We will contact the pharmaceutical rep to ensure that [place] is available at the time of procedure.  We enjoyed meeting him today and look forward to continuing to participate in his care.    Nicholos Johns, PA-C    Tyler Pita, MD  [facility] Direct Dial: [phone]  Fax: [phone] [email or web address]  Skype  [email or web address]   This document serves as a record of services personally performed by Tyler Pita, MD and Freeman Caldron, PA-C. It was created on their behalf by Wilburn Mylar, a trained medical scribe. The  creation of this record is based on the scribe's personal observations and the provider's statements to them. This document has been checked and approved by the attending provider.

## 2020-03-03 NOTE — Progress Notes (Signed)
Introduced myself to patient and his wife, as the prostate nurse navigator and discussed my role. He states he has a brother with prostate cancer but no other family history. He was exposed to agent orange so not sure if it is a factor. He has had multiple abdominal tumors removed and currently being treated for a GIST tumor. He needs surgery  to remove a tumor but it has been postponed by COVID but is taking Gleevec. His brother received brachytherapy and is doing well. He is interested in brachytherapy but would like to discuss all of  radiation options. I gave him my business card and asked him to call with question or concerns. He voiced understanding.

## 2020-03-06 ENCOUNTER — Encounter: Payer: Self-pay | Admitting: Licensed Clinical Social Worker

## 2020-03-06 NOTE — Progress Notes (Signed)
Donald Klein Clinical Social Work  Clinical Social Work was referred by distress Klein protocol.  The patient scored a 5 on the Psychosocial Distress Thermometer which indicates moderate distress. Clinical Social Worker contacted patient by phone to assess for distress and other psychosocial needs.   Patient reports feeling much better after getting more information and choosing a plan at his appt with radiation oncology last week. He has no resource needs at this time and has his wife for support.   CSW informed patient of the support team and support services at [facility].  CSW provided contact information and encouraged patient to call with any questions or concerns.  ONCBCN DISTRESS Klein 03/03/2020  Klein Type Initial Klein  Distress experienced in past week (1-10) 5  Emotional problem type Depression;Nervousness/Anxiety;Adjusting to illness  Information Concerns Type Lack of info about maintaining fitness  Physical Problem type Pain;Sleep/insomnia;Breathing;Constipation/diarrhea;Sexual problems;Skin dry/itchy;Swollen arms/legs  Physician notified of physical symptoms Yes  Referral to clinical psychology No  Referral to clinical social work No  Referral to dietition No  Referral to financial advocate No  Referral to support programs Yes  Referral to palliative care No    Clinical Social Worker follow up needed: No.  If yes, follow up plan:  Donald Klein, [place], LCSW

## 2020-03-07 ENCOUNTER — Other Ambulatory Visit: Payer: Self-pay | Admitting: Urology

## 2020-03-07 DIAGNOSIS — M899 Disorder of bone, unspecified: Secondary | ICD-10-CM

## 2020-03-07 NOTE — Telephone Encounter (Signed)
-----   Message from [name], MD sent at 03/01/2020  6:35 PM EDT ----- Negative CT ----- Message ----- From: Valentina Lucks, LPN Sent: [id number]   4:18 PM EDT To: [name], MD  Pls review.

## 2020-03-07 NOTE — Telephone Encounter (Signed)
Lft msg notifying pt on phone of results. Unable to reach after 3 attempts.

## 2020-03-08 ENCOUNTER — Ambulatory Visit (INDEPENDENT_AMBULATORY_CARE_PROVIDER_SITE_OTHER): Payer: Medicare PPO | Admitting: Urology

## 2020-03-08 ENCOUNTER — Other Ambulatory Visit: Payer: Self-pay

## 2020-03-08 ENCOUNTER — Ambulatory Visit (HOSPITAL_COMMUNITY)
Admission: RE | Admit: 2020-03-08 | Discharge: 2020-03-08 | Disposition: A | Discharge status: Home / Self Care | Payer: Medicare PPO | Source: Ambulatory Visit | Attending: Urology | Admitting: Urology

## 2020-03-08 VITALS — BP 163/72 | HR 65 | Temp 98.4°F | Ht 69.0 in | Wt 177.0 lb

## 2020-03-08 DIAGNOSIS — C61 Malignant neoplasm of prostate: Secondary | ICD-10-CM

## 2020-03-08 DIAGNOSIS — M899 Disorder of bone, unspecified: Secondary | ICD-10-CM | POA: Diagnosis present

## 2020-03-08 LAB — MICROSCOPIC EXAMINATION
Bacteria, UA: NONE SEEN
Epithelial Cells (non renal): NONE SEEN /hpf (ref 0–10)
Renal Epithel, UA: NONE SEEN /hpf
WBC, UA: NONE SEEN /hpf (ref 0–5)

## 2020-03-08 LAB — URINALYSIS, ROUTINE W REFLEX MICROSCOPIC
Bilirubin, UA: NEGATIVE
Glucose, UA: NEGATIVE
Ketones, UA: NEGATIVE
Leukocytes,UA: NEGATIVE
Nitrite, UA: NEGATIVE
Specific Gravity, UA: 1.025 (ref 1.005–1.030)
Urobilinogen, Ur: 0.2 mg/dL (ref 0.2–1.0)
pH, UA: 5.5 (ref 5.0–7.5)

## 2020-03-08 NOTE — Progress Notes (Signed)
03/08/2020 3:24 PM   Donald Klein 1946-04-20 [id number]  Referring provider: [name], MD [address] [place],  [place] [address]  followup prostate cancer  HPI: Donald Klein is a 74yo here for followup for prostate cancer. He met with Dr. Tammi Klippel and has elected to proceed with brachytherapy. He underwent CT which showed no evidence of metastatic disease. Bone scan shows a possible right rib lesion. No hx of trauma. No significant LUTS   PMH: Past Medical History:  Diagnosis Date  . Anemia   . Anxiety   . Barrett esophagus    Last EGD 05/27/08 Barrett's without dyplasia. EGD due 05/2011  . Chronic back pain   . Colitis    In 1980s, dx with UC by Dr. Humphrey Rolls at time of TCS, 1992 TCS, chronic colitis  . Depression   . Diabetes mellitus   . GERD (gastroesophageal reflux disease)   . Helicobacter pylori gastritis 2009   treated  . HTN (hypertension)   . Prostate cancer ([place])   . PTSD (post-traumatic stress disorder)   . Sleep apnea   . 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 Tubular adenoma of colon 10/08/10   Last colonosocpy 1 cecal TA    Surgical History: Past Surgical History:  Procedure Laterality Date  . APPENDECTOMY  July 2014   at time of small bowel resection  . BIOPSY  01/06/2018   Procedure: BIOPSY;  Surgeon: Daneil Dolin, MD;  [facility];  Service: Endoscopy;;  gastric biopsy , esophageal biopsy  . BIOPSY  10/29/2018   Procedure: BIOPSY;  Surgeon: Daneil Dolin, MD;  [facility];  Service: Endoscopy;;  gastric, esophageal   . CHOLECYSTECTOMY    . COLONOSCOPY  10/08/2010   Normal rectum/tubular adenoma/normal random biopsy. Next TCS due 10/2015.  Marland Kitchen COLONOSCOPY N/A 09/30/2014   Dr. Gala Romney: Redunant colon . Status post segmental biopsy and stool sampling. Negative stool samples and colonic biopsies.   . COLONOSCOPY WITH PROPOFOL N/A 10/29/2018   Procedure:  COLONOSCOPY WITH PROPOFOL;  Surgeon: Daneil Dolin, MD;  [facility];  Service: Endoscopy;  Laterality: N/A;  12:00pm  . ESOPHAGOGASTRODUODENOSCOPY  07/31/2011   Dr. Doyce Para segment Barrett's esophagus s/p bx Hiatal hernia. Duodenal diverticulum. No dysplasia.Next EGD 07/2014.  Marland Kitchen ESOPHAGOGASTRODUODENOSCOPY N/A 09/30/2014   Dr. Gala Romney: Abnormal distal esophagus consistant with prior diagnosis of short segment Barrett's esophagus status post biopsy. 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 Hiatal hernia. Abnormal Gastric mucosa of uncertain significance status post gastric biopsy. path with +Barrett's esophagus but no dysplasia, mild chronic gastritis. Surveillance for Barrett's due in 2019  . ESOPHAGOGASTRODUODENOSCOPY N/A 01/06/2018   Procedure: ESOPHAGOGASTRODUODENOSCOPY (EGD);  Surgeon: Daneil Dolin, MD;  [facility];  Service: Endoscopy;  Laterality: N/A;  12:00pm  . ESOPHAGOGASTRODUODENOSCOPY (EGD) WITH PROPOFOL N/A 10/29/2018   Procedure: ESOPHAGOGASTRODUODENOSCOPY (EGD) WITH PROPOFOL;  Surgeon: Daneil Dolin, MD;  [facility];  Service: Endoscopy;  Laterality: N/A;  . EXPLORATORY LAPAROTOMY W/ BOWEL RESECTION  July 2014   [facility]: with lysis of adhesions, small bowel resection X 2, resection of mesenteric mass, appendectomy, path: fibromatosis   . 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  PROSTATE BIOPSY      Home Medications:  Allergies as of 03/08/2020      Reactions   Lisinopril Other (See Comments)   Pt unsure    Metformin Diarrhea   Enoxaparin Rash   Other reaction(s): Eruption of skin   Lovenox [enoxaparin Sodium] Hives, Rash   Penicillins Rash    Has patient had a PCN reaction causing immediate rash,  facial/tongue/throat swelling, SOB or lightheadedness with hypotension: Yes Has patient had a PCN reaction causing severe rash involving mucus membranes or skin necrosis: No Has patient had a PCN reaction that required hospitalization No Has patient had a PCN reaction occurring within the last 10 years: No If all of the above answers are "NO", then may proceed with Cephalosporin use.      Medication List       Accurate as of March 08, 2020  3:24 PM. If you have any questions, ask your nurse or doctor.        alfuzosin 10 MG 24 hr tablet Commonly known as: UROXATRAL Take 1 tablet (10 mg total) by mouth daily with breakfast.   amLODipine 5 MG tablet Commonly known as: NORVASC Take 5 mg by mouth daily.   aspirin 81 MG EC tablet   carboxymethylcellulose 0.5 % Soln Commonly known as: REFRESH PLUS INSTILL ONE DROP IN BOTH EYES THREE TIMES A DAY   Cholecalciferol 25 MCG (1000 UT) tablet   diazepam 5 MG tablet Commonly known as: VALIUM Take 5 mg by mouth daily as needed for anxiety.   dicyclomine 10 MG capsule Commonly known as: BENTYL Take 1 capsule (10 mg total) by mouth 4 (four) times daily as needed for spasms (diarrhea).   DRY EYES OP Place 1 drop into both eyes 2 (two) times daily as needed (dry eyes).   ferrous sulfate 325 (65 FE) MG tablet Take 325 mg by mouth daily with breakfast.   Fish Oil 1000 MG Caps Take by mouth.   gabapentin 400 MG capsule Commonly known as: NEURONTIN Take 400 mg by mouth daily.   hydrocortisone 2.5 % rectal cream Commonly known as: ANUSOL-HC Place 1 application rectally 2 (two) times daily. For up to 10 days at a time   imatinib 400 MG tablet Commonly known as: GLEEVEC Take 1 tablet by mouth daily.   Januvia 100 MG tablet Generic drug: sitaGLIPtin Take 100 mg by mouth daily.   losartan 100 MG tablet Commonly known as: COZAAR Take 50 mg by mouth daily.   Lovaza 1 g capsule Generic drug: omega-3 acid ethyl esters Take 1  capsule by mouth 2 (two) times daily.   metoprolol tartrate 50 MG tablet Commonly known as: LOPRESSOR Take 25 mg by mouth daily.   nitroGLYCERIN 0.4 MG SL tablet Commonly known as: NITROSTAT Place 0.4 mg under the tongue every 5 (five) minutes as needed for chest pain.   omeprazole 20 MG capsule Commonly known as: PRILOSEC Take 20 mg by mouth 2 (two) times daily before a meal.   QUEtiapine 50 MG tablet Commonly known as: SEROQUEL Take 50 mg by mouth at bedtime as needed for depression.   rosuvastatin 10 MG tablet Commonly known as: CRESTOR Take 10 mg by mouth daily.   sertraline 100 MG tablet Commonly known as: ZOLOFT Take 100 mg by mouth daily as needed (PTSD).   sildenafil 100 MG tablet Commonly known as: VIAGRA Take by mouth.   tamsulosin 0.4 MG Caps capsule Commonly known as: FLOMAX Take 1 capsule (0.4 mg total) by  mouth at bedtime.   vitamin C 250 MG tablet Commonly known as: ASCORBIC ACID Take 250 mg by mouth daily.       Allergies:  Allergies  Allergen Reactions  . Lisinopril Other (See Comments)    Pt unsure   . Metformin Diarrhea  . Enoxaparin Rash    Other reaction(s): Eruption of skin  . Lovenox [Enoxaparin Sodium] Hives and Rash  . Penicillins Rash     Has patient had a PCN reaction causing immediate rash, facial/tongue/throat swelling, SOB or lightheadedness with hypotension: Yes Has patient had a PCN reaction causing severe rash involving mucus membranes or skin necrosis: No Has patient had a PCN reaction that required hospitalization No Has patient had a PCN reaction occurring within the last 10 years: No If all of the above answers are "NO", then may proceed with Cephalosporin use.     Family History: Family History  Problem Relation Age of Onset  . Heart disease Mother   . Suicidality Mother   . Heart attack Father        90  . Heart disease Father   . Heart disease Sister   . Prostate cancer Brother   . Colon cancer Neg Hx   .  Breast cancer Neg Hx   . Pancreatic cancer Neg Hx     Social History:  reports that he has never smoked. He has never used smokeless tobacco. He reports that he does not drink alcohol and does not use drugs.  ROS: All other review of systems were reviewed and are negative except what is noted above in HPI  Physical Exam: BP (!) 163/72   Pulse 65   Temp 98.4 F (36.9 C)   Ht '5\' 9"'  (1.753 m)   Wt 177 lb (80.3 kg)   BMI 26.14 kg/m   Constitutional:  Alert and oriented, No acute distress. HEENT:  AT, moist mucus membranes.  Trachea midline, no masses. Cardiovascular: No clubbing, cyanosis, or edema. Respiratory: Normal respiratory effort, no increased work of breathing. GI: Abdomen is soft, nontender, nondistended, no abdominal masses GU: No CVA tenderness.  Lymph: No cervical or inguinal lymphadenopathy. Skin: No rashes, bruises or suspicious lesions. Neurologic: Grossly intact, no focal deficits, moving all 4 extremities. Psychiatric: Normal mood and affect.  Laboratory Data: Lab Results  Component Value Date   WBC 8.9 07/12/2018   HGB 13.4 07/12/2018   HCT 41.4 07/12/2018   MCV 86.8 07/12/2018   PLT 191 07/12/2018    Lab Results  Component Value Date   CREATININE 1.24 02/09/2020    Lab Results  Component Value Date   PSA 12.0 (H) 11/22/2019    No results found for: TESTOSTERONE  No results found for: HGBA1C  Urinalysis    Component Value Date/Time   COLORURINE YELLOW 07/12/2018 [place] 07/12/2018 1158   LABSPEC 1.012 07/12/2018 1158   PHURINE 5.0 07/12/2018 1158   GLUCOSEU NEGATIVE 07/12/2018 1158   HGBUR NEGATIVE 07/12/2018 1158   BILIRUBINUR NEGATIVE 07/12/2018 1158   KETONESUR NEGATIVE 07/12/2018 1158   PROTEINUR NEGATIVE 07/12/2018 1158   UROBILINOGEN 0.2 03/29/2010 0803   NITRITE NEGATIVE 07/12/2018 1158   LEUKOCYTESUR NEGATIVE 07/12/2018 1158    Lab Results  Component Value Date   BACTERIA RARE (A) 05/16/2018     Pertinent Imaging: Ct 10/27 and bone scan 10/19: Images reviewed and discussed with the patient No results found for this or any previous visit.  No results found for this or any previous visit.  No results found for this or any previous visit.  No results found for this or any previous visit.  No results found for this or any previous visit.  No results found for this or any previous visit.  No results found for this or any previous visit.  No results found for this or any previous visit.   Assessment & Plan:    1. Prostate cancer ([place]) -We will obtain rib xray priro to scheduling brachytherapy with [facility]   No follow-ups on file.  Nicolette Bang, MD  [facility] [place]

## 2020-03-08 NOTE — Progress Notes (Signed)
Urological Symptom Review  Patient is experiencing the following symptoms: Frequent urination Get up at night to urinate Leakage of urine Stream starts and stops Erection problems (male only)   Review of Systems  Gastrointestinal (upper)  : Indigestion/heartburn  Gastrointestinal (lower) : Diarrhea Constipation  Constitutional : Fatigue  Skin: Skin rash/lesion Itching  Eyes: Blurred vision  Ear/Nose/Throat : Sinus problems  Hematologic/Lymphatic: Easy bruising  Cardiovascular : Leg swelling  Respiratory : Negative for respiratory symptoms  Endocrine: Negative for endocrine symptoms  Musculoskeletal: Back pain Joint pain  Neurological: Negative for neurological symptoms  Psychologic: Depression Anxiety

## 2020-03-17 NOTE — Progress Notes (Signed)
Results mailed 

## 2020-03-20 ENCOUNTER — Encounter: Payer: Self-pay | Admitting: Urology

## 2020-03-20 NOTE — Patient Instructions (Signed)
Prostate Cancer  The prostate is a male gland that helps make semen. Prostate cancer is when abnormal cells grow in this gland. Follow these instructions at home:  Take over-the-counter and prescription medicines only as told by your doctor.  Eat a healthy diet.  Get plenty of sleep.  Ask your doctor for help to find a support group for men with prostate cancer.  Keep all follow-up visits as told by your doctor. This is important.  If you have to go to the hospital, let your cancer doctor (oncologist) know.  Touch, hold, hug, and caress your partner to continue to show sexual feelings. Contact a doctor if:  You have trouble peeing (urinating).  You have blood in your pee (urine).  You have pain in your hips, back, or chest. Get help right away if:  You have weakness in your legs.  You lose feeling (have numbness) in your legs.  You cannot control your pee or your poop (stool).  You have trouble breathing.  You have sudden pain in your chest.  You have chills or a fever. Summary  The prostate is a male gland that helps make semen. Prostate cancer is when abnormal cells grow in this gland.  Ask your doctor for help to find a support group for men with prostate cancer.  Contact a doctor if you have problems peeing or have any new pain that you did not have before. This information is not intended to replace advice given to you by your health care provider. Make sure you discuss any questions you have with your health care provider. Document Revised: 04/04/2017 Document Reviewed: 01/01/2016 Elsevier Patient Education  2020 [employer]  

## 2020-04-19 ENCOUNTER — Telehealth: Payer: Self-pay | Admitting: *Deleted

## 2020-04-19 NOTE — Telephone Encounter (Signed)
CALLED PATIENT TO REMIND OF PRE-SEED APPTS. FOR 04-20-20, SPOKE WITH PATIENT'S WIFE- JANN AND SHE IS AWARE OF THESE APPTS.

## 2020-04-20 ENCOUNTER — Ambulatory Visit (HOSPITAL_COMMUNITY)
Admission: RE | Admit: 2020-04-20 | Discharge: 2020-04-20 | Disposition: A | Discharge status: Home / Self Care | Payer: Medicare PPO | Source: Ambulatory Visit | Attending: Urology | Admitting: Urology

## 2020-04-20 ENCOUNTER — Other Ambulatory Visit: Payer: Self-pay

## 2020-04-20 ENCOUNTER — Ambulatory Visit
Admission: RE | Admit: 2020-04-20 | Discharge: 2020-04-20 | Disposition: A | Discharge status: Home / Self Care | Payer: Medicare PPO | Source: Ambulatory Visit | Attending: Radiation Oncology | Admitting: Radiation Oncology

## 2020-04-20 ENCOUNTER — Encounter (HOSPITAL_COMMUNITY)
Admission: RE | Admit: 2020-04-20 | Discharge: 2020-04-20 | Disposition: A | Discharge status: Home / Self Care | Payer: Medicare PPO | Source: Ambulatory Visit | Attending: Urology | Admitting: Urology

## 2020-04-20 ENCOUNTER — Ambulatory Visit
Admission: RE | Admit: 2020-04-20 | Discharge: 2020-04-20 | Disposition: A | Discharge status: Home / Self Care | Payer: Medicare PPO | Source: Ambulatory Visit | Attending: Urology | Admitting: Urology

## 2020-04-20 ENCOUNTER — Encounter: Payer: Self-pay | Admitting: Medical Oncology

## 2020-04-20 DIAGNOSIS — Z01818 Encounter for other preprocedural examination: Secondary | ICD-10-CM | POA: Diagnosis present

## 2020-04-20 DIAGNOSIS — C61 Malignant neoplasm of prostate: Secondary | ICD-10-CM | POA: Diagnosis present

## 2020-04-20 NOTE — Progress Notes (Signed)
  Radiation Oncology         [phone]) [phone] ________________________________  Name: Donald Klein MRN: [id number]  Date: 04/20/2020  DOB: 11/26/1945  SIMULATION AND TREATMENT PLANNING NOTE PUBIC ARCH STUDY  CC:Kim, Jeneen Rinks, MD  Alyson Ingles Candee Furbish, MD  DIAGNOSIS:  Oncology History   No history exists.      ICD-10-CM   1. Prostate cancer ([place])  C61     COMPLEX SIMULATION:  The patient presented today for evaluation for possible prostate seed implant. He was brought to the radiation planning suite and placed supine on the CT couch. A 3-dimensional image study set was obtained in upload to the planning computer. There, on each axial slice, I contoured the prostate gland. Then, using three-dimensional radiation planning tools I reconstructed the prostate in view of the structures from the transperineal needle pathway to assess for possible pubic arch interference. In doing so, I did not appreciate any pubic arch interference. Also, the patient's prostate volume was estimated based on the drawn structure. The volume was 36 cc.  Given the pubic arch appearance and prostate volume, patient remains a good candidate to proceed with prostate seed implant. Today, he freely provided informed written consent to proceed.    PLAN: The patient will undergo prostate seed implant.   ________________________________  Sheral Apley. Tammi Klippel, M.D.

## 2020-05-04 NOTE — Progress Notes (Signed)
Pt had EKG done for surgery on 06-02-2020 as protocol per physician orders.  Noted abnormal EKG after it was confirmed.  Reviewed with anesthesia, Jodell Cipro PA, stated with comparing with several previous EKGs done in epic looks the same without any significant changes and as long pt is not symptomatic the EKG is ok.  Called and spoke w/ pt via phone informed of why I was calling and pt denies any cardiac s&s, sob, and no peripheral swelling

## 2020-05-24 ENCOUNTER — Ambulatory Visit: Payer: Medicare PPO | Admitting: Urology

## 2020-05-29 ENCOUNTER — Telehealth: Payer: Self-pay | Admitting: *Deleted

## 2020-05-29 NOTE — Telephone Encounter (Signed)
Called patient to remind of lab and Covid testing for 05-30-20, lvm for a return call

## 2020-05-30 ENCOUNTER — Encounter (HOSPITAL_COMMUNITY)
Admission: RE | Admit: 2020-05-30 | Discharge: 2020-05-30 | Disposition: A | Discharge status: Home / Self Care | Payer: Medicare PPO | Source: Ambulatory Visit | Attending: Urology | Admitting: Urology

## 2020-05-30 ENCOUNTER — Other Ambulatory Visit: Payer: Self-pay

## 2020-05-30 ENCOUNTER — Other Ambulatory Visit (HOSPITAL_COMMUNITY)
Admission: RE | Admit: 2020-05-30 | Discharge: 2020-05-30 | Disposition: A | Discharge status: Home / Self Care | Payer: Medicare PPO | Source: Ambulatory Visit | Attending: Urology | Admitting: Urology

## 2020-05-30 DIAGNOSIS — Z20822 Contact with and (suspected) exposure to covid-19: Secondary | ICD-10-CM | POA: Insufficient documentation

## 2020-05-30 DIAGNOSIS — Z01812 Encounter for preprocedural laboratory examination: Secondary | ICD-10-CM | POA: Insufficient documentation

## 2020-05-30 LAB — COMPREHENSIVE METABOLIC PANEL
ALT: 31 U/L (ref 0–44)
AST: 30 U/L (ref 15–41)
Albumin: 3.8 g/dL (ref 3.5–5.0)
Alkaline Phosphatase: 41 U/L (ref 38–126)
Anion gap: 8 (ref 5–15)
BUN: 15 mg/dL (ref 8–23)
CO2: 25 mmol/L (ref 22–32)
Calcium: 8.2 mg/dL — ABNORMAL LOW (ref 8.9–10.3)
Chloride: 109 mmol/L (ref 98–111)
Creatinine, Ser: 1.29 mg/dL — ABNORMAL HIGH (ref 0.61–1.24)
GFR, Estimated: 57 mL/min — ABNORMAL LOW (ref >60)
Glucose, Bld: 146 mg/dL — ABNORMAL HIGH (ref 70–99)
Potassium: 4.2 mmol/L (ref 3.5–5.1)
Sodium: 142 mmol/L (ref 135–145)
Total Bilirubin: 0.5 mg/dL (ref 0.3–1.2)
Total Protein: 6.4 g/dL — ABNORMAL LOW (ref 6.5–8.1)

## 2020-05-30 LAB — CBC
HCT: 36.6 % — ABNORMAL LOW (ref 39.0–52.0)
Hemoglobin: 12.5 g/dL — ABNORMAL LOW (ref 13.0–17.0)
MCH: 33.2 pg (ref 26.0–34.0)
MCHC: 34.2 g/dL (ref 30.0–36.0)
MCV: 97.1 fL (ref 80.0–100.0)
Platelets: 170 10*3/uL (ref 150–400)
RBC: 3.77 MIL/uL — ABNORMAL LOW (ref 4.22–5.81)
RDW: 15.2 % (ref 11.5–15.5)
WBC: 4.4 10*3/uL (ref 4.0–10.5)
nRBC: 0 % (ref 0.0–0.2)

## 2020-05-30 LAB — PROTIME-INR
INR: 1 (ref 0.8–1.2)
Prothrombin Time: 12.9 seconds (ref 11.4–15.2)

## 2020-05-30 LAB — APTT: aPTT: 25 seconds (ref 24–36)

## 2020-05-30 LAB — SARS CORONAVIRUS 2 (TAT 6-24 HRS): SARS Coronavirus 2: NEGATIVE

## 2020-05-31 ENCOUNTER — Other Ambulatory Visit: Payer: Self-pay

## 2020-05-31 ENCOUNTER — Encounter (HOSPITAL_BASED_OUTPATIENT_CLINIC_OR_DEPARTMENT_OTHER): Payer: Self-pay | Admitting: Urology

## 2020-05-31 DIAGNOSIS — L989 Disorder of the skin and subcutaneous tissue, unspecified: Secondary | ICD-10-CM

## 2020-05-31 HISTORY — DX: Disorder of the skin and subcutaneous tissue, unspecified: L98.9

## 2020-05-31 NOTE — Progress Notes (Signed)
Spoke w/ via phone for pre-op interview---PT AND WIFE JANN Lab needs dos----   none            Lab results------ekg 04-20-2020 ok per Janett Billow zanetto pa see 05-22-2019 progress note denise sexton rn, chest xray 04-20-2020 epic, cbc, cmet, pt, ptt 05-30-2020 epic, chest ct 08-25-2019 care everywhere Arrive at -------1100 am 06-02-2020 NPO after MN NO Solid Food.  Clear liquids from MN until---1000 am then npo Medications to take morning of surgery -----pt wishes to take only metoprolol am of surgery Diabetic medication -----none day of surgery Patient Special Instructions -----pt stopped 81 mg aspirin 05-28-2020 on own and will stop gleevav 05-31-2020 in pm due to gleevac causes diarrhea for patient Pre-Op special Istructions ----- fleets enema am of surgery Patient verbalized understanding of instructions that were given at this phone interview. Patient denies shortness of breath, chest pain, fever, cough at this phone interview.  Patient not using cpap and does not wish to bring for day of surgery

## 2020-06-01 ENCOUNTER — Telehealth: Payer: Self-pay | Admitting: *Deleted

## 2020-06-01 NOTE — Progress Notes (Signed)
  Radiation Oncology         [phone]) [phone] ________________________________  Name: Donald Klein MRN: [id number]  Date: 06/01/2020  DOB: 1945-07-13       Prostate Seed Implant  CC:Kim, Jeneen Rinks, MD  No ref. provider found  DIAGNOSIS: 75 y.o. gentleman with Stage T1c adenocarcinoma of the prostate with Gleason score of 3+4, and PSA of 12.    ICD-10-CM   1. Prostate cancer ([place])  C61     PROCEDURE: Insertion of radioactive I-125 seeds into the prostate gland.  RADIATION DOSE: 145 Gy, definitive therapy.  TECHNIQUE: Donald Klein was brought to the operating room with the urologist. He was placed in the dorsolithotomy position. He was catheterized and a rectal tube was inserted. The perineum was shaved, prepped and draped. The ultrasound probe was then introduced into the rectum to see the prostate gland.  TREATMENT DEVICE: A needle grid was attached to the ultrasound probe stand and anchor needles were placed.  3D PLANNING: The prostate was imaged in 3D using a sagittal sweep of the prostate probe. These images were transferred to the planning computer. There, the prostate, urethra and rectum were defined on each axial reconstructed image. Then, the software created an optimized 3D plan and a few seed positions were adjusted. The quality of the plan was reviewed using [facility] information for the target and the following two organs at risk:  Urethra and Rectum.  Then the accepted plan was printed and handed off to the radiation therapist.  Under my supervision, the custom loading of the seeds and spacers was carried out and loaded into sealed vicryl sleeves.  These pre-loaded needles were then placed into the needle holder.Marland Kitchen  PROSTATE VOLUME STUDY:  Using transrectal ultrasound the volume of the prostate was verified to be 40.7 cc.  SPECIAL TREATMENT PROCEDURE/SUPERVISION AND HANDLING: The pre-loaded needles were then delivered under sagittal guidance. A total of 21 needles were used to deposit 72  seeds in the prostate gland. The individual seed activity was 0.445 mCi.  [facility]:  Yes  COMPLEX SIMULATION: At the end of the procedure, an anterior radiograph of the pelvis was obtained to document seed positioning and count. Cystoscopy was performed to check the urethra and bladder.  MICRODOSIMETRY: At the end of the procedure, the patient was emitting 0.131 mR/hr at 1 meter. Accordingly, he was considered safe for hospital discharge.  PLAN: The patient will return to the radiation oncology clinic for post implant CT dosimetry in three weeks.   ________________________________  Sheral Apley Tammi Klippel, M.D.

## 2020-06-01 NOTE — Telephone Encounter (Signed)
CALLED PATIENT TO REMIND OF PROCEDURE FOR 06-02-20, LVM FOR A RETURN CALL

## 2020-06-02 ENCOUNTER — Encounter: Payer: Self-pay | Admitting: Medical Oncology

## 2020-06-02 ENCOUNTER — Emergency Department (HOSPITAL_COMMUNITY)
Admission: EM | Admit: 2020-06-02 | Discharge: 2020-06-03 | Disposition: A | Discharge status: Home / Self Care | Payer: Medicare PPO | Source: Home / Self Care | Attending: Emergency Medicine | Admitting: Emergency Medicine

## 2020-06-02 ENCOUNTER — Ambulatory Visit (HOSPITAL_BASED_OUTPATIENT_CLINIC_OR_DEPARTMENT_OTHER): Payer: Medicare PPO | Admitting: Physician Assistant

## 2020-06-02 ENCOUNTER — Ambulatory Visit (HOSPITAL_COMMUNITY): Payer: Medicare PPO

## 2020-06-02 ENCOUNTER — Encounter (HOSPITAL_BASED_OUTPATIENT_CLINIC_OR_DEPARTMENT_OTHER)
Admission: RE | Disposition: A | Discharge status: Home / Self Care | Payer: Self-pay | Source: Home / Self Care | Attending: Urology

## 2020-06-02 ENCOUNTER — Ambulatory Visit (HOSPITAL_BASED_OUTPATIENT_CLINIC_OR_DEPARTMENT_OTHER)
Admission: RE | Admit: 2020-06-02 | Discharge: 2020-06-02 | Disposition: A | Discharge status: Home / Self Care | Payer: Medicare PPO | Attending: Urology | Admitting: Urology

## 2020-06-02 ENCOUNTER — Encounter (HOSPITAL_BASED_OUTPATIENT_CLINIC_OR_DEPARTMENT_OTHER): Payer: Self-pay | Admitting: Urology

## 2020-06-02 DIAGNOSIS — Y846 Urinary catheterization as the cause of abnormal reaction of the patient, or of later complication, without mention of misadventure at the time of the procedure: Secondary | ICD-10-CM | POA: Insufficient documentation

## 2020-06-02 DIAGNOSIS — Z880 Allergy status to penicillin: Secondary | ICD-10-CM | POA: Insufficient documentation

## 2020-06-02 DIAGNOSIS — Z8546 Personal history of malignant neoplasm of prostate: Secondary | ICD-10-CM | POA: Insufficient documentation

## 2020-06-02 DIAGNOSIS — I10 Essential (primary) hypertension: Secondary | ICD-10-CM | POA: Insufficient documentation

## 2020-06-02 DIAGNOSIS — T839XXA Unspecified complication of genitourinary prosthetic device, implant and graft, initial encounter: Secondary | ICD-10-CM

## 2020-06-02 DIAGNOSIS — Z7982 Long term (current) use of aspirin: Secondary | ICD-10-CM | POA: Diagnosis not present

## 2020-06-02 DIAGNOSIS — Z79899 Other long term (current) drug therapy: Secondary | ICD-10-CM | POA: Insufficient documentation

## 2020-06-02 DIAGNOSIS — Z888 Allergy status to other drugs, medicaments and biological substances status: Secondary | ICD-10-CM | POA: Diagnosis not present

## 2020-06-02 DIAGNOSIS — C61 Malignant neoplasm of prostate: Secondary | ICD-10-CM | POA: Diagnosis present

## 2020-06-02 DIAGNOSIS — T83091A Other mechanical complication of indwelling urethral catheter, initial encounter: Secondary | ICD-10-CM | POA: Insufficient documentation

## 2020-06-02 DIAGNOSIS — E119 Type 2 diabetes mellitus without complications: Secondary | ICD-10-CM | POA: Insufficient documentation

## 2020-06-02 HISTORY — DX: Fibromyalgia: M79.7

## 2020-06-02 HISTORY — DX: Irritable bowel syndrome, unspecified: K58.9

## 2020-06-02 HISTORY — DX: Hydrocele, unspecified: N43.3

## 2020-06-02 HISTORY — PX: SPACE OAR INSTILLATION: SHX6769

## 2020-06-02 HISTORY — DX: Hyperlipidemia, unspecified: E78.5

## 2020-06-02 HISTORY — DX: Other nonspecific abnormal finding of lung field: R91.8

## 2020-06-02 HISTORY — PX: RADIOACTIVE SEED IMPLANT: SHX5150

## 2020-06-02 HISTORY — DX: Gastrointestinal stromal tumor of stomach: C49.A2

## 2020-06-02 HISTORY — DX: Presence of spectacles and contact lenses: Z97.3

## 2020-06-02 HISTORY — DX: Sensorineural hearing loss, bilateral: H90.3

## 2020-06-02 HISTORY — DX: Type 2 diabetes mellitus without complications: E11.9

## 2020-06-02 HISTORY — DX: Personal history of other diseases of the digestive system: Z87.19

## 2020-06-02 HISTORY — DX: Fatty (change of) liver, not elsewhere classified: K76.0

## 2020-06-02 LAB — GLUCOSE, CAPILLARY
Glucose-Capillary: 117 mg/dL — ABNORMAL HIGH (ref 70–99)
Glucose-Capillary: 121 mg/dL — ABNORMAL HIGH (ref 70–99)

## 2020-06-02 SURGERY — INSERTION, RADIATION SOURCE, PROSTATE
Anesthesia: General | Site: Perineum

## 2020-06-02 MED ORDER — DEXAMETHASONE SODIUM PHOSPHATE 4 MG/ML IJ SOLN
INTRAMUSCULAR | Status: DC | PRN
  Administered 2020-06-02: 5 mg via INTRAVENOUS

## 2020-06-02 MED ORDER — ONDANSETRON HCL 4 MG/2ML IJ SOLN: 4.0000 mg | Freq: Once | INTRAMUSCULAR | Status: DC | PRN

## 2020-06-02 MED ORDER — OXYCODONE HCL 5 MG PO TABS
5.0000 mg | ORAL_TABLET | Freq: Once | ORAL | Status: DC | PRN
Start: 2020-06-02 — End: 2020-06-02

## 2020-06-02 MED ORDER — GLYCOPYRROLATE PF 0.2 MG/ML IJ SOSY
PREFILLED_SYRINGE | INTRAMUSCULAR | Status: AC
  Filled 2020-06-02: qty 1

## 2020-06-02 MED ORDER — EPHEDRINE 5 MG/ML INJ
INTRAVENOUS | Status: AC
  Filled 2020-06-02: qty 10

## 2020-06-02 MED ORDER — SODIUM CHLORIDE (PF) 0.9 % IJ SOLN
INTRAMUSCULAR | Status: DC | PRN
  Administered 2020-06-02: 50 mL

## 2020-06-02 MED ORDER — CEFAZOLIN SODIUM-DEXTROSE 2-4 GM/100ML-% IV SOLN
INTRAVENOUS | Status: AC
  Filled 2020-06-02: qty 100

## 2020-06-02 MED ORDER — FENTANYL CITRATE (PF) 100 MCG/2ML IJ SOLN
25.0000 ug | INTRAMUSCULAR | Status: DC | PRN
  Administered 2020-06-02 (×2): 25 ug via INTRAVENOUS

## 2020-06-02 MED ORDER — ONDANSETRON HCL 4 MG/2ML IJ SOLN
INTRAMUSCULAR | Status: DC | PRN
  Administered 2020-06-02: 4 mg via INTRAVENOUS

## 2020-06-02 MED ORDER — FLEET ENEMA 7-19 GM/118ML RE ENEM: 1.0000 | ENEMA | Freq: Once | RECTAL | Status: DC

## 2020-06-02 MED ORDER — TRAMADOL HCL 50 MG PO TABS: 50.0000 mg | ORAL_TABLET | Freq: Four times a day (QID) | ORAL | 0 refills | Status: AC | PRN

## 2020-06-02 MED ORDER — HYDRALAZINE HCL 20 MG/ML IJ SOLN
10.0000 mg | INTRAMUSCULAR | Status: DC | PRN
  Administered 2020-06-02 (×2): 10 mg via INTRAVENOUS

## 2020-06-02 MED ORDER — GLYCOPYRROLATE 0.2 MG/ML IJ SOLN
INTRAMUSCULAR | Status: DC | PRN
  Administered 2020-06-02: .2 mg via INTRAVENOUS

## 2020-06-02 MED ORDER — FENTANYL CITRATE (PF) 100 MCG/2ML IJ SOLN
INTRAMUSCULAR | Status: AC
  Filled 2020-06-02: qty 2

## 2020-06-02 MED ORDER — LIDOCAINE HCL (CARDIAC) PF 100 MG/5ML IV SOSY
PREFILLED_SYRINGE | INTRAVENOUS | Status: DC | PRN
  Administered 2020-06-02: 60 mg via INTRAVENOUS

## 2020-06-02 MED ORDER — FENTANYL CITRATE (PF) 100 MCG/2ML IJ SOLN
INTRAMUSCULAR | Status: DC | PRN
  Administered 2020-06-02 (×2): 50 ug via INTRAVENOUS

## 2020-06-02 MED ORDER — LIDOCAINE HCL (PF) 2 % IJ SOLN
INTRAMUSCULAR | Status: AC
  Filled 2020-06-02: qty 5

## 2020-06-02 MED ORDER — PROPOFOL 10 MG/ML IV BOLUS
INTRAVENOUS | Status: DC | PRN
  Administered 2020-06-02: 20 mg via INTRAVENOUS
  Administered 2020-06-02: 140 mg via INTRAVENOUS

## 2020-06-02 MED ORDER — LACTATED RINGERS IV SOLN: INTRAVENOUS | Status: DC

## 2020-06-02 MED ORDER — ONDANSETRON HCL 4 MG/2ML IJ SOLN
INTRAMUSCULAR | Status: AC
  Filled 2020-06-02: qty 2

## 2020-06-02 MED ORDER — OXYCODONE HCL 5 MG/5ML PO SOLN: 5.0000 mg | Freq: Once | ORAL | Status: DC | PRN

## 2020-06-02 MED ORDER — IOHEXOL 300 MG/ML  SOLN
INTRAMUSCULAR | Status: DC | PRN
  Administered 2020-06-02: 5 mL via URETHRAL

## 2020-06-02 MED ORDER — EPHEDRINE SULFATE-NACL 50-0.9 MG/10ML-% IV SOSY
PREFILLED_SYRINGE | INTRAVENOUS | Status: DC | PRN
  Administered 2020-06-02: 15 mg via INTRAVENOUS
  Administered 2020-06-02 (×2): 10 mg via INTRAVENOUS

## 2020-06-02 MED ORDER — PROPOFOL 500 MG/50ML IV EMUL
INTRAVENOUS | Status: AC
  Filled 2020-06-02: qty 50

## 2020-06-02 MED ORDER — SODIUM CHLORIDE 0.9 % IV SOLN
INTRAVENOUS | Status: AC | PRN
  Administered 2020-06-02: 1000 mL

## 2020-06-02 MED ORDER — DEXAMETHASONE SODIUM PHOSPHATE 10 MG/ML IJ SOLN
INTRAMUSCULAR | Status: AC
  Filled 2020-06-02: qty 1

## 2020-06-02 MED ORDER — CEFAZOLIN SODIUM-DEXTROSE 2-4 GM/100ML-% IV SOLN
2.0000 g | Freq: Once | INTRAVENOUS | Status: AC
  Administered 2020-06-02: 2 g via INTRAVENOUS

## 2020-06-02 MED ORDER — HYDRALAZINE HCL 20 MG/ML IJ SOLN
INTRAMUSCULAR | Status: AC
  Filled 2020-06-02: qty 1

## 2020-06-02 SURGICAL SUPPLY — 36 items
BAG DRN RND TRDRP ANRFLXCHMBR (UROLOGICAL SUPPLIES) ×1
BAG URINE DRAIN 2000ML AR STRL (UROLOGICAL SUPPLIES) ×2 IMPLANT
BLADE CLIPPER SENSICLIP SURGIC (BLADE) ×2 IMPLANT
CATH FOLEY 2WAY SLVR  5CC 16FR (CATHETERS) ×4
CATH FOLEY 2WAY SLVR 5CC 16FR (CATHETERS) ×2 IMPLANT
CATH ROBINSON RED A/P 20FR (CATHETERS) ×2 IMPLANT
CLOTH BEACON ORANGE TIMEOUT ST (SAFETY) ×2 IMPLANT
CNTNR URN SCR LID CUP LEK RST (MISCELLANEOUS) ×2 IMPLANT
CONT SPEC 4OZ STRL OR WHT (MISCELLANEOUS) ×4
COVER BACK TABLE 60X90IN (DRAPES) ×2 IMPLANT
COVER MAYO STAND STRL (DRAPES) ×2 IMPLANT
DRAPE C-ARM 35X43 STRL (DRAPES) ×2 IMPLANT
DRSG TEGADERM 4X4.75 (GAUZE/BANDAGES/DRESSINGS) ×4 IMPLANT
DRSG TEGADERM 8X12 (GAUZE/BANDAGES/DRESSINGS) ×4 IMPLANT
GLOVE BIO SURGEON STRL SZ7.5 (GLOVE) IMPLANT
GLOVE BIO SURGEON STRL SZ8 (GLOVE) ×2 IMPLANT
GLOVE SURG ENC MOIS LTX SZ6.5 (GLOVE) ×2 IMPLANT
GLOVE SURG ORTHO 8.5 STRL (GLOVE) ×2 IMPLANT
GLOVE SURG SS PI 6.5 STRL IVOR (GLOVE) IMPLANT
GOWN STRL REUS W/TWL XL LVL3 (GOWN DISPOSABLE) ×2 IMPLANT
HOLDER FOLEY CATH W/STRAP (MISCELLANEOUS) ×2 IMPLANT
I-Seed AgX100 ×72 IMPLANT
IMPL SPACEOAR SYSTEM 10ML (Spacer) IMPLANT
IMPLANT SPACEOAR SYSTEM 10ML (Spacer) ×2 IMPLANT
IV NS 1000ML (IV SOLUTION) ×2
IV NS 1000ML BAXH (IV SOLUTION) ×1 IMPLANT
KIT TURNOVER CYSTO (KITS) ×2 IMPLANT
MANIFOLD NEPTUNE II (INSTRUMENTS) IMPLANT
MARKER SKIN DUAL TIP RULER LAB (MISCELLANEOUS) ×2 IMPLANT
PACK CYSTO (CUSTOM PROCEDURE TRAY) ×2 IMPLANT
SURGILUBE 2OZ TUBE FLIPTOP (MISCELLANEOUS) ×2 IMPLANT
SYR 10ML LL (SYRINGE) ×4 IMPLANT
TOWEL OR 17X26 10 PK STRL BLUE (TOWEL DISPOSABLE) ×4 IMPLANT
UNDERPAD 30X36 HEAVY ABSORB (UNDERPADS AND DIAPERS) ×4 IMPLANT
WATER STERILE IRR 3000ML UROMA (IV SOLUTION) ×2 IMPLANT
WATER STERILE IRR 500ML POUR (IV SOLUTION) ×2 IMPLANT

## 2020-06-02 NOTE — Anesthesia Procedure Notes (Signed)
Procedure Name: LMA Insertion Date/Time: 06/02/2020 1:10 PM Performed by: Eulas Post, Nochum Fenter W, CRNA Pre-anesthesia Checklist: Patient identified, Emergency Drugs available, Suction available and Patient being monitored Patient Re-evaluated:Patient Re-evaluated prior to induction Oxygen Delivery Method: Circle system utilized Preoxygenation: Pre-oxygenation with 100% oxygen Induction Type: IV induction Ventilation: Mask ventilation without difficulty LMA: LMA inserted LMA Size: 5.0 Number of attempts: 1 Placement Confirmation: positive ETCO2 and breath sounds checked- equal and bilateral Tube secured with: Tape Dental Injury: Teeth and Oropharynx as per pre-operative assessment

## 2020-06-02 NOTE — Transfer of Care (Signed)
Immediate Anesthesia Transfer of Care Note  Patient: Donald Klein  Procedure(s) Performed: RADIOACTIVE SEED IMPLANT/BRACHYTHERAPY IMPLANT (N/A Perineum) SPACE OAR INSTILLATION (N/A Perineum)  Patient Location: PACU  Anesthesia Type:General  Level of Consciousness: awake  Airway & Oxygen Therapy: Patient Spontanous Breathing and Patient connected to face mask oxygen  Post-op Assessment: Report given to RN and Post -op Vital signs reviewed and stable  Post vital signs: Reviewed and stable  Last Vitals:  Vitals Value Taken Time  BP 174/78 06/02/20 1443  Temp    Pulse 92 06/02/20 1444  Resp 17 06/02/20 1444  SpO2 100 % 06/02/20 1444  Vitals shown include unvalidated device data.  Last Pain:  Vitals:   06/02/20 1141  TempSrc: Oral  PainSc: 3       Patients Stated Pain Goal: 3 (76/54/65 0354)  Complications: No complications documented.

## 2020-06-02 NOTE — H&P (Signed)
prostate cancer  HPI: [name] is a 75yo here for followup for prostate cancer. He met with Dr. Tammi Klippel and has elected to proceed with brachytherapy. He underwent CT which showed no evidence of metastatic disease. Bone scan shows a possible right rib lesion which was benign on rib film. No hx of trauma. No significant LUTS   PMH:     Past Medical History:  Diagnosis Date  . Anemia   . Anxiety   . Barrett esophagus    Last EGD 05/27/08 Barrett's without dyplasia. EGD due 05/2011  . Chronic back pain   . Colitis    In 1980s, dx with UC by Dr. Humphrey Rolls at time of TCS, 1992 TCS, chronic colitis  . Depression   . Diabetes mellitus   . GERD (gastroesophageal reflux disease)   . Helicobacter pylori gastritis 2009   treated  . HTN (hypertension)   . Prostate cancer ([place])   . PTSD (post-traumatic stress disorder)   . Sleep apnea   . 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 Tubular adenoma of colon 10/08/10   Last colonosocpy 1 cecal TA    Surgical History:      Past Surgical History:  Procedure Laterality Date  . APPENDECTOMY  July 2014   at time of small bowel resection  . BIOPSY  01/06/2018   Procedure: BIOPSY;  Surgeon: Daneil Dolin, MD;  [facility];  Service: Endoscopy;;  gastric biopsy , esophageal biopsy  . BIOPSY  10/29/2018   Procedure: BIOPSY;  Surgeon: Daneil Dolin, MD;  [facility];  Service: Endoscopy;;  gastric, esophageal   . CHOLECYSTECTOMY    . COLONOSCOPY  10/08/2010   Normal rectum/tubular adenoma/normal random biopsy. Next TCS due 10/2015.  Marland Kitchen COLONOSCOPY N/A 09/30/2014   Dr. Gala Romney: Redunant colon . Status post segmental biopsy and stool sampling. Negative stool samples and colonic biopsies.   . COLONOSCOPY WITH PROPOFOL N/A 10/29/2018   Procedure: COLONOSCOPY WITH PROPOFOL;  Surgeon: Daneil Dolin, MD;  [facility];  Service:  Endoscopy;  Laterality: N/A;  12:00pm  . ESOPHAGOGASTRODUODENOSCOPY  07/31/2011   Dr. Doyce Para segment Barrett's esophagus s/p bx Hiatal hernia. Duodenal diverticulum. No dysplasia.Next EGD 07/2014.  Marland Kitchen ESOPHAGOGASTRODUODENOSCOPY N/A 09/30/2014   Dr. Gala Romney: Abnormal distal esophagus consistant with prior diagnosis of short segment Barrett's esophagus status post biopsy. 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 Hiatal hernia. Abnormal Gastric mucosa of uncertain significance status post gastric biopsy. path with +Barrett's esophagus but no dysplasia, mild chronic gastritis. Surveillance for Barrett's due in 2019  . ESOPHAGOGASTRODUODENOSCOPY N/A 01/06/2018   Procedure: ESOPHAGOGASTRODUODENOSCOPY (EGD);  Surgeon: Daneil Dolin, MD;  [facility];  Service: Endoscopy;  Laterality: N/A;  12:00pm  . ESOPHAGOGASTRODUODENOSCOPY (EGD) WITH PROPOFOL N/A 10/29/2018   Procedure: ESOPHAGOGASTRODUODENOSCOPY (EGD) WITH PROPOFOL;  Surgeon: Daneil Dolin, MD;  [facility];  Service: Endoscopy;  Laterality: N/A;  . EXPLORATORY LAPAROTOMY W/ BOWEL RESECTION  July 2014   [facility]: with lysis of adhesions, small bowel resection X 2, resection of mesenteric mass, appendectomy, path: fibromatosis   . 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 PROSTATE BIOPSY      Home Medications:  Allergies as  of 03/08/2020      Reactions   Lisinopril Other (See Comments)   Pt unsure    Metformin Diarrhea   Enoxaparin Rash   Other reaction(s): Eruption of skin   Lovenox [enoxaparin Sodium] Hives, Rash   Penicillins Rash    Has patient had a PCN reaction causing immediate rash, facial/tongue/throat swelling, SOB or lightheadedness with hypotension: Yes Has  patient had a PCN reaction causing severe rash involving mucus membranes or skin necrosis: No Has patient had a PCN reaction that required hospitalization No Has patient had a PCN reaction occurring within the last 10 years: No If all of the above answers are "NO", then may proceed with Cephalosporin use.         Medication List       Accurate as of March 08, 2020  3:24 PM. If you have any questions, ask your nurse or doctor.        alfuzosin 10 MG 24 hr tablet Commonly known as: UROXATRAL Take 1 tablet (10 mg total) by mouth daily with breakfast.   amLODipine 5 MG tablet Commonly known as: NORVASC Take 5 mg by mouth daily.   aspirin 81 MG EC tablet   carboxymethylcellulose 0.5 % Soln Commonly known as: REFRESH PLUS INSTILL ONE DROP IN BOTH EYES THREE TIMES A DAY   Cholecalciferol 25 MCG (1000 UT) tablet   diazepam 5 MG tablet Commonly known as: VALIUM Take 5 mg by mouth daily as needed for anxiety.   dicyclomine 10 MG capsule Commonly known as: BENTYL Take 1 capsule (10 mg total) by mouth 4 (four) times daily as needed for spasms (diarrhea).   DRY EYES OP Place 1 drop into both eyes 2 (two) times daily as needed (dry eyes).   ferrous sulfate 325 (65 FE) MG tablet Take 325 mg by mouth daily with breakfast.   Fish Oil 1000 MG Caps Take by mouth.   gabapentin 400 MG capsule Commonly known as: NEURONTIN Take 400 mg by mouth daily.   hydrocortisone 2.5 % rectal cream Commonly known as: ANUSOL-HC Place 1 application rectally 2 (two) times daily. For up to 10 days at a time   imatinib 400 MG tablet Commonly known as: GLEEVEC Take 1 tablet by mouth daily.   Januvia 100 MG tablet Generic drug: sitaGLIPtin Take 100 mg by mouth daily.   losartan 100 MG tablet Commonly known as: COZAAR Take 50 mg by mouth daily.   Lovaza 1 g capsule Generic drug: omega-3 acid ethyl esters Take 1 capsule by mouth 2 (two) times daily.   metoprolol  tartrate 50 MG tablet Commonly known as: LOPRESSOR Take 25 mg by mouth daily.   nitroGLYCERIN 0.4 MG SL tablet Commonly known as: NITROSTAT Place 0.4 mg under the tongue every 5 (five) minutes as needed for chest pain.   omeprazole 20 MG capsule Commonly known as: PRILOSEC Take 20 mg by mouth 2 (two) times daily before a meal.   QUEtiapine 50 MG tablet Commonly known as: SEROQUEL Take 50 mg by mouth at bedtime as needed for depression.   rosuvastatin 10 MG tablet Commonly known as: CRESTOR Take 10 mg by mouth daily.   sertraline 100 MG tablet Commonly known as: ZOLOFT Take 100 mg by mouth daily as needed (PTSD).   sildenafil 100 MG tablet Commonly known as: VIAGRA Take by mouth.   tamsulosin 0.4 MG Caps capsule Commonly known as: FLOMAX Take 1 capsule (0.4 mg total) by mouth at bedtime.   vitamin C 250 MG  tablet Commonly known as: ASCORBIC ACID Take 250 mg by mouth daily.       Allergies:       Allergies  Allergen Reactions  . Lisinopril Other (See Comments)    Pt unsure   . Metformin Diarrhea  . Enoxaparin Rash    Other reaction(s): Eruption of skin  . Lovenox [Enoxaparin Sodium] Hives and Rash  . Penicillins Rash     Has patient had a PCN reaction causing immediate rash, facial/tongue/throat swelling, SOB or lightheadedness with hypotension: Yes Has patient had a PCN reaction causing severe rash involving mucus membranes or skin necrosis: No Has patient had a PCN reaction that required hospitalization No Has patient had a PCN reaction occurring within the last 10 years: No If all of the above answers are "NO", then may proceed with Cephalosporin use.     Family History:      Family History  Problem Relation Age of Onset  . Heart disease Mother   . Suicidality Mother   . Heart attack Father        44  . Heart disease Father   . Heart disease Sister   . Prostate cancer Brother   . Colon cancer Neg Hx   . Breast cancer  Neg Hx   . Pancreatic cancer Neg Hx     Social History:  reports that he has never smoked. He has never used smokeless tobacco. He reports that he does not drink alcohol and does not use drugs.  ROS: All other review of systems were reviewed and are negative except what is noted above in HPI  Physical Exam: BP (!) 163/72   Pulse 65   Temp 98.4 F (36.9 C)   Ht _0  (1.753 m)   Wt 177 lb (80.3 kg)   BMI 26.14 kg/m   Constitutional:  Alert and oriented, No acute distress. HEENT: [place] AT, moist mucus membranes.  Trachea midline, no masses. Cardiovascular: No clubbing, cyanosis, or edema. Respiratory: Normal respiratory effort, no increased work of breathing. GI: Abdomen is soft, nontender, nondistended, no abdominal masses GU: No CVA tenderness.  Lymph: No cervical or inguinal lymphadenopathy. Skin: No rashes, bruises or suspicious lesions. Neurologic: Grossly intact, no focal deficits, moving all 4 extremities. Psychiatric: Normal mood and affect.  Laboratory Data: Recent Labs       Lab Results  Component Value Date   WBC 8.9 07/12/2018   HGB 13.4 07/12/2018   HCT 41.4 07/12/2018   MCV 86.8 07/12/2018   PLT 191 07/12/2018      Recent Labs       Lab Results  Component Value Date   CREATININE 1.24 02/09/2020      Recent Labs       Lab Results  Component Value Date   PSA 12.0 (H) 11/22/2019      Recent Labs  No results found for: TESTOSTERONE    Recent Labs  No results found for: HGBA1C    Urinalysis Labs (Brief)          Component Value Date/Time   COLORURINE YELLOW 07/12/2018 [place] 07/12/2018 1158   LABSPEC 1.012 07/12/2018 1158   PHURINE 5.0 07/12/2018 [place] 07/12/2018 1158   [place] 07/12/2018 1158   [place] 07/12/2018 1158   KETONESUR NEGATIVE 07/12/2018 1158   PROTEINUR NEGATIVE 07/12/2018 1158   UROBILINOGEN 0.2 03/29/2010 0803   NITRITE  NEGATIVE 07/12/2018 1158   LEUKOCYTESUR NEGATIVE 07/12/2018 1158  Recent Labs       Lab Results  Component Value Date   BACTERIA RARE (A) 05/16/2018      Pertinent Imaging: Ct 10/27 and bone scan 10/19: Images reviewed and discussed with the patient No results found for this or any previous visit.  No results found for this or any previous visit.  No results found for this or any previous visit.  No results found for this or any previous visit.  No results found for this or any previous visit.  No results found for this or any previous visit.  No results found for this or any previous visit.  No results found for this or any previous visit.   Assessment & Plan:    1. Prostate cancer ([place]) -We will proceed with brachytherapy with SpaceOAR. Risks/benefits/alternatives discussed

## 2020-06-02 NOTE — Op Note (Signed)
PRE-OPERATIVE DIAGNOSIS:  Adenocarcinoma of the prostate  POST-OPERATIVE DIAGNOSIS:  Same  PROCEDURE:  Procedure(s): 1. I-125 radioactive seed implantation 2. Cystoscopy 3. SpaceOAR placement  SURGEON:  Surgeon(s): Nicolette Bang, MD  Radiation oncologist: Tyler Pita, MD  ANESTHESIA:  General  EBL:  Minimal  DRAINS: 62 French Foley catheter  INDICATION: Donald Klein is a 75 year old with a history of T1c prostate cancer. After discussing treatment options he has elected to proceed with brachytherapy  Description of procedure: After informed consent the patient was brought to the major OR, placed on the table and administered general anesthesia. He was then moved to the modified lithotomy position with his perineum perpendicular to the floor. His perineum and genitalia were then sterilely prepped. An official timeout was then performed. A 16 French Foley catheter was then placed in the bladder and filled with dilute contrast, a rectal tube was placed in the rectum and the transrectal ultrasound probe was placed in the rectum and affixed to the stand. He was then sterilely draped.  Real time ultrasonography was used along with the seed planning software [employer] Pro[employer] vs. 4.2.21. This was used to develop the seed plan including the number of needles as well as number of seeds required for complete and adequate coverage. Real-time ultrasonography was then used along with the previously developed plan and the Nucletron device to implant a total of 72 seeds using 21 needles. This proceeded without difficulty or complication.  We then proceeded to mix the SpaceOAR using the kit supplied from the manufacturer. Once this was complete we placed a sinal needle into the perirectal fat between the rectum and the prostate. Once this was accomplished we injected 2cc of normal saline to hydrodissect the plain. We then instilled the the SpaceOAR through the spinal needle and noted good  distribution in the perirectal fat.    A Foley catheter was then removed as well as the transrectal ultrasound probe and rectal probe. Flexible cystoscopy was then performed using the 17 French flexible scope which revealed a normal urethra throughout its length down to the sphincter which appeared intact. The prostatic urethra revealed bilobar hypertrophy but no evidence of obstruction, seeds, spacers or lesions. The bladder was then entered and fully and systematically inspected. The ureteral orifices were noted to be of normal configuration and position. The mucosa revealed no evidence of tumors. There were also no stones identified within the bladder. I noted no seeds or spacers on the floor of the bladder and retroflexion of the scope revealed no seeds protruding from the base of the prostate.  The cystoscope was then removed and a new 35 French Foley catheter was then inserted and the balloon was filled with 10 cc of sterile water. This was connected to closed system drainage and the patient was awakened and taken to recovery room in stable and satisfactory condition. He tolerated procedure well and there were no intraoperative complications.

## 2020-06-02 NOTE — Discharge Instructions (Signed)
Indwelling Urinary Catheter Care, Adult °An indwelling urinary catheter is a thin tube that is put into your bladder. The tube helps to drain pee (urine) out of your body. The tube goes in through your urethra. Your urethra is where pee comes out of your body. Your pee will come out through the catheter, then it will go into a bag (drainage bag). °Take good care of your catheter so it will work well. °How to wear your catheter and bag °Supplies needed °· Sticky tape (adhesive tape) or a leg strap. °· Alcohol wipe or soap and water (if you use tape). °· A clean towel (if you use tape). °· Large overnight bag. °· Smaller bag (leg bag). °Wearing your catheter °Attach your catheter to your leg with tape or a leg strap. °· Make sure the catheter is not pulled tight. °· If a leg strap gets wet, take it off and put on a dry strap. °· If you use tape to hold the bag on your leg: °1. Use an alcohol wipe or soap and water to wash your skin where the tape made it sticky before. °2. Use a clean towel to pat-dry that skin. °3. Use new tape to make the bag stay on your leg. °Wearing your bags °You should have been given a large overnight bag. °· You may wear the overnight bag in the day or night. °· Always have the overnight bag lower than your bladder.  Do not let the bag touch the floor. °· Before you go to sleep, put a clean plastic bag in a wastebasket. Then hang the overnight bag inside the wastebasket. °You should also have a smaller leg bag that fits under your clothes. °· Always wear the leg bag below your knee. °· Do not wear your leg bag at night. °How to care for your skin and catheter °Supplies needed °· A clean washcloth. °· Water and mild soap. °· A clean towel. °Caring for your skin and catheter °· Clean the skin around your catheter every day: °1. Wash your hands with soap and water. °2. Wet a clean washcloth in warm water and mild soap. °3. Clean the skin around your urethra. °§ If you are male: °§ Gently  spread the folds of skin around your vagina (labia). °§ With the washcloth in your other hand, wipe the inner side of your labia on each side. Wipe from front to back. °§ If you are male: °§ Pull back any skin that covers the end of your penis (foreskin). °§ With the washcloth in your other hand, wipe your penis in small circles. Start wiping at the tip of your penis, then move away from the catheter. °§ Move the foreskin back in place, if needed. °4. With your free hand, hold the catheter close to where it goes into your body. °§ Keep holding the catheter during cleaning so it does not get pulled out. °5. With the washcloth in your other hand, clean the catheter. °§ Only wipe downward on the catheter. °§ Do not wipe upward toward your body. Doing this may push germs into your urethra and cause infection. °6. Use a clean towel to pat-dry the catheter and the skin around it. Make sure to wipe off all soap. °7. Wash your hands with soap and water. °· Shower every day. Do not take baths. °· Do not use cream, ointment, or lotion on the area where the catheter goes into your body, unless your doctor tells you to. °· Do not   use powders, sprays, or lotions on your genital area. °· Check your skin around the catheter every day for signs of infection. Check for: °? Redness, swelling, or pain. °? Fluid or blood. °? Warmth. °? Pus or a bad smell.  °  °  °How to empty the bag °Supplies needed °· Rubbing alcohol. °· Gauze pad or cotton ball. °· Tape or a leg strap. °Emptying the bag °Pour the pee out of your bag when it is ?-½ full, or at least 2-3 times a day. Do this for your overnight bag and your leg bag. °1. Wash your hands with soap and water. °2. Separate (detach) the bag from your leg. °3. Hold the bag over the toilet or a clean pail. Keep the bag lower than your hips and bladder. This is so the pee (urine) does not go back into the tube. °4. Open the pour spout. It is at the bottom of the bag. °5. Empty the pee into the  toilet or pail. Do not let the pour spout touch any surface. °6. Put rubbing alcohol on a gauze pad or cotton ball. °7. Use the gauze pad or cotton ball to clean the pour spout. °8. Close the pour spout. °9. Attach the bag to your leg with tape or a leg strap. °10. Wash your hands with soap and water. °Follow instructions for cleaning the drainage bag: °· From the product maker. °· As told by your doctor. °How to change the bag °Supplies needed °· Alcohol wipes. °· A clean bag. °· Tape or a leg strap. °Changing the bag °Replace your bag when it starts to leak, smell bad, or look dirty. °1. Wash your hands with soap and water. °2. Separate the dirty bag from your leg. °3. Pinch the catheter with your fingers so that pee does not spill out. °4. Separate the catheter tube from the bag tube where these tubes connect (at the connection valve). Do not let the tubes touch any surface. °5. Clean the end of the catheter tube with an alcohol wipe. Use a different alcohol wipe to clean the end of the bag tube. °6. Connect the catheter tube to the tube of the clean bag. °7. Attach the clean bag to your leg with tape or a leg strap. Do not make the bag tight on your leg. °8. Wash your hands with soap and water. °General rules °· Never pull on your catheter. Never try to take it out. Doing that can hurt you. °· Always wash your hands before and after you touch your catheter or bag. Use a mild, fragrance-free soap. If you do not have soap and water, use hand sanitizer. °· Always make sure there are no twists or bends (kinks) in the catheter tube. °· Always make sure there are no leaks in the catheter or bag. °· Drink enough fluid to keep your pee pale yellow. °· Do not take baths, swim, or use a hot tub. °· If you are male, wipe from front to back after you poop (have a bowel movement).   °Contact a doctor if: °· Your pee is cloudy. °· Your pee smells worse than usual. °· Your catheter gets clogged. °· Your catheter  leaks. °· Your bladder feels full. °Get help right away if: °· You have redness, swelling, or pain where the catheter goes into your body. °· You have fluid, blood, pus, or a bad smell coming from the area where the catheter goes into your body. °· Your skin feels   warm where the catheter goes into your body. °· You have a fever. °· You have pain in your: °? Belly (abdomen). °? Legs. °? Lower back. °? Bladder. °· You see blood in the catheter. °· Your pee is pink or red. °· You feel sick to your stomach (nauseous). °· You throw up (vomit). °· You have chills. °· Your pee is not draining into the bag. °· Your catheter gets pulled out. °Summary °· An indwelling urinary catheter is a thin tube that is placed into the bladder to help drain pee (urine) out of the body. °· The catheter is placed into the part of the body that drains pee from the bladder (urethra). °· Taking good care of your catheter will keep it working properly and help prevent problems. °· Always wash your hands before and after touching your catheter or bag. °· Never pull on your catheter or try to take it out. °This information is not intended to replace advice given to you by your health care provider. Make sure you discuss any questions you have with your health care provider. °Document Revised: 08/14/2018 Document Reviewed: 12/06/2016 °Elsevier Patient Education © 2021 [employer] ° °

## 2020-06-02 NOTE — Anesthesia Preprocedure Evaluation (Addendum)
Anesthesia Evaluation  Patient identified by MRN, date of birth, ID band Patient awake    Reviewed: Allergy & Precautions, NPO status , Patient's Chart, lab work & pertinent test results  Airway Mallampati: II  TM Distance: >3 FB Neck ROM: Full    Dental no notable dental hx.    Pulmonary sleep apnea (does not use cpap) ,    Pulmonary exam normal breath sounds clear to auscultation       Cardiovascular hypertension, Pt. on medications and Pt. on home beta blockers Normal cardiovascular exam Rhythm:Regular Rate:Normal  Normal sinus rhythm Inferior infarct , age undetermined T wave abnormality, consider lateral ischemia vs. early repolarization Abnormal ECG Since previous tracing T wave abnormality, consider lateral ischemia NOW PRESENT Confirmed by Glenetta Hew [phone]) on 04/21/2020 11:19:40 PM   Neuro/Psych PSYCHIATRIC DISORDERS Anxiety Depression  Neuromuscular disease    GI/Hepatic hiatal hernia, GERD  ,Ulcerative colitis   Endo/Other  diabetes  Renal/GU negative Renal ROS   Prostate cancer    Musculoskeletal  (+) Fibromyalgia -  Abdominal   Peds negative pediatric ROS (+)  Hematology negative hematology ROS (+)   Anesthesia Other Findings   Reproductive/Obstetrics                          Anesthesia Physical Anesthesia Plan  ASA: III  Anesthesia Plan: General   Post-op Pain Management:    Induction:   PONV Risk Score and Plan: 2  Airway Management Planned: Oral ETT  Additional Equipment: None  Intra-op Plan:   Post-operative Plan: Extubation in OR  Informed Consent: I have reviewed the patients History and Physical, chart, labs and discussed the procedure including the risks, benefits and alternatives for the proposed anesthesia with the patient or authorized representative who has indicated his/her understanding and acceptance.     Dental advisory given  Plan  Discussed with: Anesthesiologist and CRNA  Anesthesia Plan Comments: (SBP elevated. Patient took metoprolol today. Losartan held on day of surgery. Will treat with IV anti-hypertensives perioperatively. Norton Blizzard, MD  )      Anesthesia Quick Evaluation

## 2020-06-03 ENCOUNTER — Encounter (HOSPITAL_COMMUNITY): Payer: Self-pay

## 2020-06-03 ENCOUNTER — Other Ambulatory Visit: Payer: Self-pay

## 2020-06-03 NOTE — ED Triage Notes (Addendum)
Pt states that he had surgery on his prostate this morning and a catheter was placed. Pt states that he has been peeing around his catheter since. Pt complaining of pain at site and pain when urinating.

## 2020-06-03 NOTE — ED Provider Notes (Signed)
[facility] EMERGENCY DEPARTMENT Provider Note   CSN: [id number] Arrival date & time: 06/02/20  2354   History Chief complaint: Problems with urinary catheter  Donald Klein is a 75 y.o. male.  The history is provided by the patient.  He has history of hypertension, diabetes, hyperlipidemia and had surgery today to have radiation seeds placed for prostate cancer.  He was discharged with a Foley catheter in place, but states he is now urinating around the catheter.  He had not noticed any blood in the urine.  Denies any pain.  Past Medical History:  Diagnosis Date  . Anemia   . Anxiety   . Barrett esophagus    Last EGD 05/27/08 Barrett's without dyplasia. EGD due 05/2011  . Chronic back pain   . Colitis    In 1980s, dx with UC by Dr. Humphrey Rolls at time of TCS, 1992 TCS, chronic colitis  . Depression   . DM type 2 (diabetes mellitus, type 2) ([place])   . Fatty liver     small spots on liver also  . Fibromyalgia   . GERD (gastroesophageal reflux disease)   . Helicobacter pylori gastritis 2009   treated  . History of hiatal hernia   . HTN (hypertension)   . Hydrocele   . Hyperlipidemia   . IBS (irritable bowel syndrome)   . 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 Perceptive hearing loss, both sides    weras hearing aides both ears  . Prostate cancer ([place])   . PTSD (post-traumatic stress disorder)   . PTSD (post-traumatic stress disorder)   . Pulmonary nodules chest ct 08-25-2019   stable per chest ct  . Skin abnormality 05/31/2020   sore on right shin x 1 week no drainage healing wife putting neosporin on daily  . Sleep apnea    no cpap did not tolerate cpap   . 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 Tubular adenoma of colon 10/08/10   Last colonosocpy 1 cecal TA  . Wears glasses     Patient Active Problem List   Diagnosis Date Noted  . Prostate cancer ([place]) 02/09/2020  . Nocturia 11/22/2019  . Benign  prostatic hyperplasia with urinary obstruction 11/22/2019  . Elevated PSA 11/22/2019  . Rectal itching 12/17/2018  . Pancreatic lesion 06/07/2015  . Mucosal abnormality of stomach   . Abdominal pain, epigastric 09/29/2012  . 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 Tubular adenoma of colon 10/08/2010  . Diarrhea 08/21/2010  . Colitis 08/21/2010  . Barrett esophagus 08/21/2010  . DM 08/16/2008  . ANXIETY 08/16/2008  . POSTTRAUMATIC STRESS DISORDER 08/16/2008  . DEPRESSION 08/16/2008  . HYPERTENSION 08/16/2008  . GASTROESOPHAGEAL REFLUX DISEASE, CHRONIC 08/16/2008  . BACK PAIN, CHRONIC 08/16/2008  . Personal history of other diseases of digestive system 08/16/2008    Past Surgical History:  Procedure Laterality Date  . APPENDECTOMY  July 2014   at time of small bowel resection  . BIOPSY  01/06/2018   Procedure: BIOPSY;  Surgeon: Daneil Dolin, MD;  Location: [facility];  Service: Endoscopy;;  gastric biopsy , esophageal biopsy  . BIOPSY  10/29/2018   Procedure: BIOPSY;  Surgeon: Daneil Dolin, MD;  Location: [facility];  Service: Endoscopy;;  gastric, esophageal  . CARDIAC CATHETERIZATION  2001   saw cardiology for 1 year and released  . CHOLECYSTECTOMY  1980's or 1990's  .  COLONOSCOPY  10/08/2010   Normal rectum/tubular adenoma/normal random biopsy. Next TCS due 10/2015.  Marland Kitchen COLONOSCOPY N/A 09/30/2014   Dr. Gala Romney: Redunant colon . Status post segmental biopsy and stool sampling. Negative stool samples and colonic biopsies.   . COLONOSCOPY WITH PROPOFOL N/A 10/29/2018   Procedure: COLONOSCOPY WITH PROPOFOL;  Surgeon: Daneil Dolin, MD;  Location: [facility];  Service: Endoscopy;  Laterality: N/A;  12:00pm  . ESOPHAGOGASTRODUODENOSCOPY  07/31/2011   Dr. Doyce Para segment Barrett's esophagus s/p bx Hiatal hernia. Duodenal diverticulum. No dysplasia.Next EGD 07/2014.  Marland Kitchen ESOPHAGOGASTRODUODENOSCOPY N/A 09/30/2014   Dr. Gala Romney: Abnormal distal esophagus  consistant with prior diagnosis of short segment Barrett's esophagus status post biopsy. 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 Hiatal hernia. Abnormal Gastric mucosa of uncertain significance status post gastric biopsy. path with +Barrett's esophagus but no dysplasia, mild chronic gastritis. Surveillance for Barrett's due in 2019  . ESOPHAGOGASTRODUODENOSCOPY N/A 01/06/2018   Procedure: ESOPHAGOGASTRODUODENOSCOPY (EGD);  Surgeon: Daneil Dolin, MD;  [facility];  Service: Endoscopy;  Laterality: N/A;  12:00pm  . ESOPHAGOGASTRODUODENOSCOPY (EGD) WITH PROPOFOL N/A 10/29/2018   Procedure: ESOPHAGOGASTRODUODENOSCOPY (EGD) WITH PROPOFOL;  Surgeon: Daneil Dolin, MD;  [facility];  Service: Endoscopy;  Laterality: N/A;  . EXPLORATORY LAPAROTOMY W/ BOWEL RESECTION  July 2014   [facility]: with lysis of adhesions, small bowel resection X 2, resection of mesenteric mass, appendectomy, path: fibromatosis   . 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 PROSTATE BIOPSY  2021       Family History  Problem Relation Age of Onset  . Heart disease Mother   . Suicidality Mother   . Heart attack Father        48  . Heart disease Father   . Heart disease Sister   . Prostate cancer Brother   . Colon cancer Neg Hx   . Breast cancer Neg Hx   . Pancreatic cancer Neg Hx     Social History   Tobacco Use  . Smoking status: Never Smoker  . Smokeless tobacco: Never Used  Vaping Use  . Vaping Use: Never used  Substance Use Topics  . Alcohol use: No    Alcohol/week: 0.0 standard drinks  . Drug use: No    Home Medications Prior to Admission medications   Medication Sig Start Date End Date Taking? Authorizing Provider  alfuzosin (UROXATRAL) 10 MG 24 hr tablet  Take 1 tablet (10 mg total) by mouth daily with breakfast. 11/22/19   McKenzie, Candee Furbish, MD  Artificial Tear Ointment (DRY EYES OP) Place 1 drop into both eyes 2 (two) times daily as needed (dry eyes).     [provider]  aspirin 81 MG EC tablet  02/26/19   [provider]  carboxymethylcellulose (REFRESH PLUS) 0.5 % SOLN INSTILL ONE DROP IN BOTH EYES THREE TIMES A DAY Patient not taking: Reported on 05/31/2020 06/17/18   [provider]  Cholecalciferol 25 MCG (1000 UT) tablet  02/26/19   [provider]  diazepam (VALIUM) 5 MG tablet Take 5 mg by mouth daily as needed for anxiety. 07/15/18   [provider]  dicyclomine (BENTYL) 10 MG capsule Take 1 capsule (10 mg total) by mouth 4 (four) times daily as needed for spasms (diarrhea). Patient not  taking: Reported on 05/31/2020 12/17/18   Carlis Stable, NP  ferrous sulfate 325 (65 FE) MG tablet Take 325 mg by mouth. Monday Wednesday Friday    [provider]  gabapentin (NEURONTIN) 400 MG capsule Take 400 mg by mouth at bedtime. 06/23/19   [provider]  hydrocortisone (ANUSOL-HC) 2.5 % rectal cream Place 1 application rectally 2 (two) times daily. For up to 10 days at a time 12/17/18   Carlis Stable, NP  imatinib (GLEEVEC) 400 MG tablet Take 1 tablet by mouth at bedtime. 10/30/19   [provider]  JANUVIA 100 MG tablet Take 100 mg by mouth daily. 09/15/19   [provider]  losartan (COZAAR) 100 MG tablet Take 100 mg by mouth daily. 09/08/19   [provider]  LOVAZA 1 G capsule Take 1 capsule by mouth 2 (two) times daily.  08/05/10   [provider]  Magnesium 400 MG CAPS Take by mouth daily.    [provider]  metoprolol (LOPRESSOR) 50 MG tablet Take 25 mg by mouth 2 (two) times daily. 07/31/10   [provider]  Multiple Vitamins-Minerals (MULTIVITAMIN WITH MINERALS) tablet Take 1 tablet by mouth daily. Centrum adult 50 plus    [provider]  neomycin-bacitracin-polymyxin (NEOSPORIN) ointment Apply 1 application topically every 12 (twelve) hours. To right shin daily    [provider]  nitroGLYCERIN (NITROSTAT) 0.4 MG SL tablet Place 0.4 mg under the tongue every 5 (five) minutes as needed for chest pain.     [provider]  NON FORMULARY Eye drop lantaprost 125 mg 1 drop both eyes at hs    [provider]  Omega-3 Fatty Acids (FISH OIL) 1000 MG CAPS Take by mouth. 09/11/16   [provider]  omeprazole (PRILOSEC) 20 MG capsule Take 20 mg by mouth 2 (two) times daily before a meal.    [provider]  QUEtiapine (SEROQUEL) 50 MG tablet Take 50 mg by mouth at bedtime as needed for depression. Ordered to take 1 and 1/2 tabs prn qhs Taking 25 mg prn qhs prn 07/15/18   [provider]  rosuvastatin (CRESTOR) 10 MG tablet Take 10 mg by mouth daily.  09/26/19   [provider]  sertraline (ZOLOFT) 100 MG tablet Take 100 mg by mouth daily as needed (PTSD).  07/15/18   [provider]  sildenafil (VIAGRA) 100 MG tablet Take by mouth. Patient not taking: Reported on 05/31/2020 07/10/19   [provider]  traMADol (ULTRAM) 50 MG tablet Take 1 tablet (50 mg total) by mouth every 6 (six) hours as needed. 06/02/20 06/02/21  [name], MD  vitamin C (ASCORBIC ACID) 250 MG tablet Take 250 mg by mouth daily.    [provider]    Allergies    Lisinopril, Metformin, Enoxaparin, Lovenox [enoxaparin sodium], and Penicillins  Review of Systems   Review of Systems  All other systems reviewed and are negative.   Physical Exam Updated Vital Signs Ht 5\' 9"  (1.753 m)   Wt 77.1 kg   BMI 25.10 kg/m   Physical Exam Vitals and nursing note reviewed.   75 year old male, resting comfortably and in no acute distress. Vital signs are significant for elevated blood pressure. Oxygen saturation is 99%, which is normal. Head is normocephalic and  atraumatic. PERRLA, EOMI. Oropharynx is clear. Neck is nontender and supple without adenopathy or JVD. Back is nontender and there is no CVA tenderness. Lungs are clear  without rales, wheezes, or rhonchi. Chest is nontender. Heart has regular rate and rhythm without murmur. Abdomen is soft, flat, nontender without masses or hepatosplenomegaly and peristalsis is normoactive. Genitalia: Uncircumcised penis with Foley catheter in place. Extremities have no cyanosis or edema, full range of motion is present. Skin is warm and dry without rash. Neurologic: Mental status is normal, cranial nerves are intact, there are no motor or sensory deficits.  ED Results / Procedures / Treatments    Procedures Procedures   Medications Ordered in ED Medications - No data to display  ED Course  I have reviewed the triage vital signs and the nursing notes.  MDM Rules/Calculators/A&P Foley catheter dysfunction, probable occluded catheter.  I suspect there is a blood clot that is occluding the catheter.  Foley catheter is replaced.  Old records are reviewed confirming surgery earlier today for placement of radiation seeds for prostate cancer.  Foley catheter was replaced and is functioning well.  He is discharged with instructions to follow-up with his urologist.  Final Clinical Impression(s) / ED Diagnoses Final diagnoses:  Complication of Foley catheter, initial encounter ([place])  Elevated blood pressure reading with diagnosis of hypertension    Rx / DC Orders ED Discharge Orders    None       Delora Fuel, MD 48/18/56 [phone]

## 2020-06-05 ENCOUNTER — Encounter (HOSPITAL_BASED_OUTPATIENT_CLINIC_OR_DEPARTMENT_OTHER): Payer: Self-pay | Admitting: Urology

## 2020-06-05 ENCOUNTER — Telehealth: Payer: Self-pay

## 2020-06-05 ENCOUNTER — Telehealth: Payer: Self-pay | Admitting: *Deleted

## 2020-06-05 NOTE — Telephone Encounter (Signed)
Returned patient's phone call, spoke with patient 

## 2020-06-05 NOTE — Anesthesia Postprocedure Evaluation (Signed)
Anesthesia Post Note  Patient: Donald Klein  Procedure(s) Performed: RADIOACTIVE SEED IMPLANT/BRACHYTHERAPY IMPLANT (N/A Perineum) SPACE OAR INSTILLATION (N/A Perineum)     Patient location during evaluation: PACU Anesthesia Type: General Level of consciousness: awake and alert Pain management: pain level controlled Vital Signs Assessment: post-procedure vital signs reviewed and stable Respiratory status: spontaneous breathing, nonlabored ventilation and respiratory function stable Cardiovascular status: blood pressure returned to baseline and stable Postop Assessment: no apparent nausea or vomiting Anesthetic complications: no   No complications documented.  Last Vitals:  Vitals:   06/02/20 1615 06/02/20 1700  BP: (!) 183/76 (!) 170/75  Pulse: 77 78  Resp: 17 16  Temp:  36.6 C  SpO2: 99% 99%    Last Pain:  Vitals:   06/02/20 1700  TempSrc:   PainSc: 0-No pain                 Merlinda Frederick

## 2020-06-07 ENCOUNTER — Ambulatory Visit (INDEPENDENT_AMBULATORY_CARE_PROVIDER_SITE_OTHER): Payer: Medicare PPO | Admitting: Urology

## 2020-06-07 ENCOUNTER — Other Ambulatory Visit: Payer: Self-pay

## 2020-06-07 ENCOUNTER — Encounter: Payer: Self-pay | Admitting: Urology

## 2020-06-07 VITALS — BP 171/77 | HR 67 | Temp 98.3°F

## 2020-06-07 DIAGNOSIS — C61 Malignant neoplasm of prostate: Secondary | ICD-10-CM | POA: Diagnosis not present

## 2020-06-07 NOTE — Patient Instructions (Signed)
Prostate Cancer  The prostate is a male gland that helps make semen. It is located below a man's bladder, in front of the rectum. Prostate cancer is when abnormal cells grow in this gland. What are the causes? The cause of this condition is not known. What increases the risk? You are more likely to develop this condition if:  You are 77 years of age or older.  You are African American.  You have a family history of prostate cancer.  You have a family history of breast cancer. What are the signs or symptoms? Symptoms of this condition include:  A need to pee often.  Peeing that is weak, or pee that stops and starts.  Trouble starting or stopping your pee.  Inability to pee.  Blood in your pee or semen.  Pain in the lower back, lower belly (abdomen), hips, or upper thighs.  Trouble getting an erection.  Trouble emptying all of your pee. How is this treated? Treatment for this condition depends on your age, your health, the kind of treatment you like, and how far the cancer has spread. Treatments include:  Being watched. This is called observation. You will be tested from time to time, but you will not get treated. Tests are to make sure that the cancer is not growing.  Surgery. This may be done to remove the prostate, to remove the testicles, or to freeze or kill cancer cells.  Radiation. This uses a strong beam to kill cancer cells.  Ultrasound energy. This uses strong sound waves to kill cancer cells.  Chemotherapy. This uses medicines that stop cancer cells from increasing. This kills cancer cells and healthy cells.  Targeted therapy. This kills cancer cells only. Healthy cells are not affected.  Hormone treatment. This stops the body from making hormones that help the cancer cells to grow. Follow these instructions at home:  Take over-the-counter and prescription medicines only as told by your doctor.  Eat a healthy diet.  Get plenty of sleep.  Ask your  doctor for help to find a support group for men with prostate cancer.  If you have to go to the hospital, let your cancer doctor (oncologist) know.  Treatment may affect your ability to have sex. Touch, hold, hug, and caress your partner to have intimate moments.  Keep all follow-up visits as told by your doctor. This is important. Contact a doctor if:  You have new or more trouble peeing.  You have new or more blood in your pee.  You have new or more pain in your hips, back, or chest. Get help right away if:  You have weakness in your legs.  You lose feeling in your legs.  You cannot control your pee or your poop (stool).  You have chills or a fever. Summary  The prostate is a male gland that helps make semen.  Prostate cancer is when abnormal cells grow in this gland.  Treatment includes doing surgery, using medicines, using very strong beams, or watching without treatment.  Ask your doctor for help to find a support group for men with prostate cancer.  Contact a doctor if you have problems peeing or have any new pain that you did not have before. This information is not intended to replace advice given to you by your health care provider. Make sure you discuss any questions you have with your health care provider. Document Revised: 04/06/2019 Document Reviewed: 04/06/2019 Elsevier Patient Education  2021 [employer]  

## 2020-06-07 NOTE — Progress Notes (Signed)
06/07/2020 11:50 AM   Donald Klein Aug 11, 1945 [id number]  Referring provider: [name], MD [place],  [place] [address]  followup after brachytherapy  HPI: Mr Paddock is a 75yo here for followup after brachytherapy. Voiding trial passed today. NO LUTS. No hematuria. NO pain.    PMH: Past Medical History:  Diagnosis Date  . Anemia   . Anxiety   . Barrett esophagus    Last EGD 05/27/08 Barrett's without dyplasia. EGD due 05/2011  . Chronic back pain   . Colitis    In 1980s, dx with UC by Dr. Humphrey Rolls at time of TCS, 1992 TCS, chronic colitis  . Depression   . DM type 2 (diabetes mellitus, type 2) ([place])   . Fatty liver     small spots on liver also  . Fibromyalgia   . GERD (gastroesophageal reflux disease)   . Helicobacter pylori gastritis 2009   treated  . History of hiatal hernia   . HTN (hypertension)   . Hydrocele   . Hyperlipidemia   . IBS (irritable bowel syndrome)   . 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 Perceptive hearing loss, both sides    weras hearing aides both ears  . Prostate cancer ([place])   . PTSD (post-traumatic stress disorder)   . PTSD (post-traumatic stress disorder)   . Pulmonary nodules chest ct 08-25-2019   stable per chest ct  . Skin abnormality 05/31/2020   sore on right shin x 1 week no drainage healing wife putting neosporin on daily  . Sleep apnea    no cpap did not tolerate cpap   . 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 Tubular adenoma of colon 10/08/10   Last colonosocpy 1 cecal TA  . Wears glasses     Surgical History: Past Surgical History:  Procedure Laterality Date  . APPENDECTOMY  July 2014   at time of small bowel resection  . BIOPSY  01/06/2018   Procedure: BIOPSY;  Surgeon: Daneil Dolin, MD;  [facility];  Service: Endoscopy;;  gastric biopsy , esophageal biopsy  . BIOPSY  10/29/2018   Procedure: BIOPSY;  Surgeon:  Daneil Dolin, MD;  [facility];  Service: Endoscopy;;  gastric, esophageal  . CARDIAC CATHETERIZATION  2001   saw cardiology for 1 year and released  . CHOLECYSTECTOMY  1980's or 1990's  . COLONOSCOPY  10/08/2010   Normal rectum/tubular adenoma/normal random biopsy. Next TCS due 10/2015.  Marland Kitchen COLONOSCOPY N/A 09/30/2014   Dr. Gala Romney: Redunant colon . Status post segmental biopsy and stool sampling. Negative stool samples and colonic biopsies.   . COLONOSCOPY WITH PROPOFOL N/A 10/29/2018   Procedure: COLONOSCOPY WITH PROPOFOL;  Surgeon: Daneil Dolin, MD;  [facility];  Service: Endoscopy;  Laterality: N/A;  12:00pm  . ESOPHAGOGASTRODUODENOSCOPY  07/31/2011   Dr. Doyce Para segment Barrett's esophagus s/p bx Hiatal hernia. Duodenal diverticulum. No dysplasia.Next EGD 07/2014.  Marland Kitchen ESOPHAGOGASTRODUODENOSCOPY N/A 09/30/2014   Dr. Gala Romney: Abnormal distal esophagus consistant with prior diagnosis of short segment Barrett's esophagus status post biopsy. 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 Hiatal hernia. Abnormal Gastric mucosa of uncertain significance status post gastric biopsy. path with +Barrett's esophagus but no dysplasia, mild chronic gastritis. Surveillance for Barrett's due in 2019  . ESOPHAGOGASTRODUODENOSCOPY N/A 01/06/2018   Procedure: ESOPHAGOGASTRODUODENOSCOPY (EGD);  Surgeon: Daneil Dolin, MD;  [facility];  Service: Endoscopy;  Laterality: N/A;  12:00pm  . ESOPHAGOGASTRODUODENOSCOPY (EGD) WITH PROPOFOL N/A 10/29/2018   Procedure: ESOPHAGOGASTRODUODENOSCOPY (EGD) WITH PROPOFOL;  Surgeon: Daneil Dolin, MD;  [facility];  Service: Endoscopy;  Laterality: N/A;  . EXPLORATORY LAPAROTOMY W/ BOWEL RESECTION  July 2014   [facility]: with lysis of adhesions, small bowel resection X 2, resection of mesenteric mass, appendectomy, path: fibromatosis   . 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 PROSTATE BIOPSY  2021  . RADIOACTIVE SEED IMPLANT N/A 06/02/2020   Procedure: RADIOACTIVE SEED IMPLANT/BRACHYTHERAPY IMPLANT;  Surgeon: [name], MD;  [facility]  Service: Urology;  Laterality: N/A;  . SPACE OAR INSTILLATION N/A 06/02/2020   Procedure: SPACE OAR INSTILLATION;  Surgeon: [name], MD;  Location: [facility];  Service: Urology;  Laterality: N/A;    Home Medications:  Allergies as of 06/07/2020      Reactions   Lisinopril Other (See Comments)   Pt unsure    Metformin Diarrhea   Enoxaparin Rash   Other reaction(s): Eruption of skin   Lovenox [enoxaparin Sodium] Hives, Rash   Penicillins Rash    Has patient had a PCN reaction causing immediate rash, facial/tongue/throat swelling, SOB or lightheadedness with hypotension: Yes Has patient had a PCN reaction causing severe rash involving mucus membranes or skin necrosis: No Has patient had a PCN reaction that required hospitalization No Has patient had a PCN reaction occurring within the last 10 years: No If all of the above answers are "NO", then may proceed with Cephalosporin use.      Medication List       Accurate as of June 07, 2020 11:50 AM. If you have any questions, ask your nurse or doctor.        alfuzosin 10 MG 24 hr tablet Commonly known as: UROXATRAL Take 1 tablet (10 mg total) by mouth daily with breakfast.   aspirin 81 MG EC tablet   Cholecalciferol 25 MCG (1000 UT) tablet   diazepam 5 MG tablet Commonly known as: VALIUM Take 5 mg by mouth daily as needed for anxiety.   DRY EYES OP Place 1 drop into both eyes 2 (two) times daily as needed (dry eyes).   ferrous sulfate 325 (65 FE) MG tablet Take 325 mg by mouth. Monday Wednesday Friday   Fish Oil 1000 MG  Caps Take by mouth.   gabapentin 400 MG capsule Commonly known as: NEURONTIN Take 400 mg by mouth at bedtime.   hydrocortisone 2.5 % rectal cream Commonly known as: ANUSOL-HC Place 1 application rectally 2 (two) times daily. For up to 10 days at a time   imatinib 400 MG tablet Commonly known as: GLEEVEC Take 1 tablet by mouth at bedtime.   Januvia 100 MG tablet Generic drug: sitaGLIPtin Take 100 mg by mouth daily.   latanoprost 0.005 % ophthalmic solution Commonly known as: XALATAN SMARTSIG:In Eye(s)   losartan 100 MG tablet Commonly known as: COZAAR Take 100 mg by mouth daily.   Lovaza 1 g capsule Generic drug: omega-3 acid ethyl esters Take 1 capsule by mouth 2 (two) times daily.   Magnesium 400 MG Caps Take by mouth daily.   metoprolol tartrate 50 MG tablet Commonly known as: LOPRESSOR Take 25 mg by mouth  2 (two) times daily.   multivitamin with minerals tablet Take 1 tablet by mouth daily. Centrum adult 50 plus   neomycin-bacitracin-polymyxin ointment Commonly known as: NEOSPORIN Apply 1 application topically every 12 (twelve) hours. To right shin daily   nitroGLYCERIN 0.4 MG SL tablet Commonly known as: NITROSTAT Place 0.4 mg under the tongue every 5 (five) minutes as needed for chest pain.   NON FORMULARY Eye drop lantaprost 125 mg 1 drop both eyes at hs   omeprazole 20 MG capsule Commonly known as: PRILOSEC Take 20 mg by mouth 2 (two) times daily before a meal.   QUEtiapine 50 MG tablet Commonly known as: SEROQUEL Take 50 mg by mouth at bedtime as needed for depression. Ordered to take 1 and 1/2 tabs prn qhs Taking 25 mg prn qhs prn   rosuvastatin 10 MG tablet Commonly known as: CRESTOR Take 10 mg by mouth daily.   sertraline 100 MG tablet Commonly known as: ZOLOFT Take 100 mg by mouth daily as needed (PTSD).   sildenafil 100 MG tablet Commonly known as: VIAGRA Take by mouth.   traMADol 50 MG tablet Commonly known as: Ultram Take 1  tablet (50 mg total) by mouth every 6 (six) hours as needed.   vitamin C 250 MG tablet Commonly known as: ASCORBIC ACID Take 250 mg by mouth daily.       Allergies:  Allergies  Allergen Reactions  . Lisinopril Other (See Comments)    Pt unsure   . Metformin Diarrhea  . Enoxaparin Rash    Other reaction(s): Eruption of skin  . Lovenox [Enoxaparin Sodium] Hives and Rash  . Penicillins Rash     Has patient had a PCN reaction causing immediate rash, facial/tongue/throat swelling, SOB or lightheadedness with hypotension: Yes Has patient had a PCN reaction causing severe rash involving mucus membranes or skin necrosis: No Has patient had a PCN reaction that required hospitalization No Has patient had a PCN reaction occurring within the last 10 years: No If all of the above answers are "NO", then may proceed with Cephalosporin use.     Family History: Family History  Problem Relation Age of Onset  . Heart disease Mother   . Suicidality Mother   . Heart attack Father        8  . Heart disease Father   . Heart disease Sister   . Prostate cancer Brother   . Colon cancer Neg Hx   . Breast cancer Neg Hx   . Pancreatic cancer Neg Hx     Social History:  reports that he has never smoked. He has never used smokeless tobacco. He reports that he does not drink alcohol and does not use drugs.  ROS: All other review of systems were reviewed and are negative except what is noted above in HPI  Physical Exam: BP (!) 171/77   Pulse 67   Temp 98.3 F (36.8 C)   Constitutional:  Alert and oriented, No acute distress. HEENT: [place] AT, moist mucus membranes.  Trachea midline, no masses. Cardiovascular: No clubbing, cyanosis, or edema. Respiratory: Normal respiratory effort, no increased work of breathing. GI: Abdomen is soft, nontender, nondistended, no abdominal masses GU: No CVA tenderness.  Lymph: No cervical or inguinal lymphadenopathy. Skin: No rashes, bruises or suspicious  lesions. Neurologic: Grossly intact, no focal deficits, moving all 4 extremities. Psychiatric: Normal mood and affect.  Laboratory Data: Lab Results  Component Value Date   WBC 4.4 05/30/2020   HGB 12.5 (L) 05/30/2020  HCT 36.6 (L) 05/30/2020   MCV 97.1 05/30/2020   PLT 170 05/30/2020    Lab Results  Component Value Date   CREATININE 1.29 (H) 05/30/2020    Lab Results  Component Value Date   PSA 12.0 (H) 11/22/2019    No results found for: TESTOSTERONE  No results found for: HGBA1C  Urinalysis    Component Value Date/Time   COLORURINE YELLOW 07/12/2018 1158   APPEARANCEUR Clear 03/08/2020 1632   LABSPEC 1.012 07/12/2018 1158   PHURINE 5.0 07/12/2018 1158   GLUCOSEU Negative 03/08/2020 1632   HGBUR NEGATIVE 07/12/2018 1158   BILIRUBINUR Negative 03/08/2020 1632   KETONESUR NEGATIVE 07/12/2018 1158   PROTEINUR 2+ (A) 03/08/2020 1632   PROTEINUR NEGATIVE 07/12/2018 1158   UROBILINOGEN 0.2 03/29/2010 0803   NITRITE Negative 03/08/2020 1632   NITRITE NEGATIVE 07/12/2018 1158   LEUKOCYTESUR Negative 03/08/2020 1632   LEUKOCYTESUR NEGATIVE 07/12/2018 1158    Lab Results  Component Value Date   LABMICR See below: 03/08/2020   WBCUA None seen 03/08/2020   LABEPIT None seen 03/08/2020   MUCUS Present 03/08/2020   BACTERIA None seen 03/08/2020    Pertinent Imaging:  No results found for this or any previous visit.  No results found for this or any previous visit.  No results found for this or any previous visit.  No results found for this or any previous visit.  No results found for this or any previous visit.  No results found for this or any previous visit.  No results found for this or any previous visit.  No results found for this or any previous visit.   Assessment & Plan:    1. Prostate cancer ([place]) -RTC 1 month to assess LUTS -RTC 3 months with PSA   Return in about 4 weeks (around 07/05/2020).  Nicolette Bang, MD  [facility]  Urology [place]

## 2020-06-07 NOTE — Progress Notes (Signed)
Fill and Pull Catheter Removal  Patient is present today for a catheter removal.  Patient was cleaned and prepped in a sterile fashion 160ml of sterile water/ saline was instilled into the bladder when the patient felt the urge to urinate. 166ml of water was then drained from the balloon.  A 16FR foley cath was removed from the bladder no complications were noted .  Patient as then given some time to void on their own.  Patient can void  139ml on their own after some time.  Patient tolerated well.  Performed by: Estill Bamberg RN    Uroflow  Peak Flow: 57ml Average Flow: 25ml Voided Volume: 170ml Voiding Time: 28sec Flow Time: 25sec Time to Peak Flow: 8sec   Urological Symptom Review  Patient is experiencing the following symptoms:    Review of Systems  Gastrointestinal (upper)  : Indigestion/heartburn  Gastrointestinal (lower) : Diarrhea Constipation  Constitutional : Fatigue  Skin: Skin rash/lesion Itching  Eyes: Blurred vision  Ear/Nose/Throat : Sinus problems  Hematologic/Lymphatic: Easy bruising  Cardiovascular : Leg swelling  Respiratory : Negative for respiratory symptoms  Endocrine: Negative for endocrine symptoms  Musculoskeletal: Joint pain  Neurological: Negative for neurological symptoms  Psychologic: Depression Anxiety

## 2020-06-19 NOTE — Telephone Encounter (Signed)
resloved

## 2020-06-28 ENCOUNTER — Telehealth: Payer: Self-pay | Admitting: *Deleted

## 2020-06-28 NOTE — Telephone Encounter (Signed)
CALLED PATIENT TO REMIND OF POST SEED APPTS. FOR 06-29-20, LVM FOR A RETURN CALL

## 2020-06-28 NOTE — Progress Notes (Signed)
Patient is here today for a follow-up post seed appointment. Patient reports: Dysuria None Hematuria None Nocturia 4-5 Leakage sometime Urgency Yes Emptying bladder States that he empties his bladder Stream weak Push or strain to start stream Sometime Bowels States that he has constipation and diarrhea. States that he take imodium for relief. Next appointment with urologist Seen february 1 ,will see again in March IPPS 18 Patients blood pressure is elevated  Denies any s/s of hypertension. States that he has  Not taken his medication this morning.

## 2020-06-29 ENCOUNTER — Ambulatory Visit
Admission: RE | Admit: 2020-06-29 | Discharge: 2020-06-29 | Disposition: A | Discharge status: Home / Self Care | Payer: Medicare PPO | Source: Ambulatory Visit | Attending: Radiation Oncology | Admitting: Radiation Oncology

## 2020-06-29 ENCOUNTER — Other Ambulatory Visit: Payer: Self-pay

## 2020-06-29 ENCOUNTER — Encounter: Payer: Self-pay | Admitting: Urology

## 2020-06-29 ENCOUNTER — Ambulatory Visit
Admission: RE | Admit: 2020-06-29 | Discharge: 2020-06-29 | Disposition: A | Discharge status: Home / Self Care | Payer: Medicare PPO | Source: Ambulatory Visit | Attending: Urology | Admitting: Urology

## 2020-06-29 VITALS — BP 192/89 | HR 63 | Temp 97.0°F | Resp 18 | Wt 176.0 lb

## 2020-06-29 DIAGNOSIS — Z923 Personal history of irradiation: Secondary | ICD-10-CM | POA: Insufficient documentation

## 2020-06-29 DIAGNOSIS — C61 Malignant neoplasm of prostate: Secondary | ICD-10-CM | POA: Insufficient documentation

## 2020-06-29 DIAGNOSIS — Z7982 Long term (current) use of aspirin: Secondary | ICD-10-CM | POA: Insufficient documentation

## 2020-06-29 DIAGNOSIS — Z79899 Other long term (current) drug therapy: Secondary | ICD-10-CM | POA: Diagnosis not present

## 2020-06-29 NOTE — Progress Notes (Signed)
  Radiation Oncology         [phone]) [phone] ________________________________  Name: Donald Klein MRN: [id number]  Date: 06/29/2020  DOB: Sep 30, 1945  COMPLEX SIMULATION NOTE  NARRATIVE:  The patient was brought to the [facility] today following prostate seed implantation approximately one month ago.  Identity was confirmed.  All relevant records and images related to the planned course of therapy were reviewed.  Then, the patient was set-up supine.  CT images were obtained.  The CT images were loaded into the planning software.  Then the prostate and rectum were contoured.  Treatment planning then occurred.  The implanted iodine 125 seeds were identified by the physics staff for projection of radiation distribution  I have requested : 3D Simulation  I have requested a DVH of the following structures: Prostate and rectum.    ________________________________  Sheral Apley Tammi Klippel, M.D.

## 2020-06-29 NOTE — Progress Notes (Signed)
Radiation Oncology         [phone]) [phone] ________________________________  Name: Donald Klein MRN: [id number]  Date: 06/29/2020  DOB: [birth year]  Post-Seed Follow-Up Visit Note  CC: Donald Gravel, MD  Donald Gustin, MD  Diagnosis:    DIAGNOSIS: 75 y.o. gentleman with Stage T1c adenocarcinoma of the prostate with Gleason score of 3+4, and PSA of 12.    ICD-10-CM   1. Prostate cancer ([name])  C61     Interval Since Last Radiation:  4 weeks 06/02/20:  Insertion of radioactive I-125 seeds into the prostate gland; 145 Gy, definitive therapy with placement of SpaceOAR gel.  Narrative:  The patient returns today for routine follow-up.  He is complaining of increased urinary frequency and urinary hesitation symptoms. He filled out a questionnaire regarding urinary function today providing and overall IPSS score of 18 characterizing his symptoms as moderate with nocturia x3-5, weak stream, frequency, urgency and feelings of incomplete emptying.  He is continue taking Uroxatrol daily as prescribed.  He specifically denies dysuria, gross hematuria, fever, chills or night sweats.  His pre-implant score was 13. He denies any abdominal pain but has had occasional loose stools/diarrhea that is managed with Imodium as needed.  He reports a healthy appetite and is maintaining his weight.  He denies any significant fatigue and overall, is pleased with his progress to date.  ALLERGIES:  is allergic to lisinopril, metformin, enoxaparin, lovenox [enoxaparin sodium], and penicillins.  Meds: Current Outpatient Medications  Medication Sig Dispense Refill  . alfuzosin (UROXATRAL) 10 MG 24 hr tablet Take 1 tablet (10 mg total) by mouth daily with breakfast. 30 tablet 11  . Artificial Tear Ointment (DRY EYES OP) Place 1 drop into both eyes 2 (two) times daily as needed (dry eyes).     . diazepam (VALIUM) 5 MG tablet Take 5 mg by mouth daily as needed for anxiety.    . ferrous sulfate 325 (65 FE) MG tablet Take  325 mg by mouth. Monday Wednesday Friday    . gabapentin (NEURONTIN) 400 MG capsule Take 400 mg by mouth at bedtime.    . hydrocortisone (ANUSOL-HC) 2.5 % rectal cream Place 1 application rectally 2 (two) times daily. For up to 10 days at a time 30 g 2  . imatinib (GLEEVEC) 400 MG tablet Take 1 tablet by mouth at bedtime.    Marland Kitchen JANUVIA 100 MG tablet Take 100 mg by mouth daily.    Marland Kitchen latanoprost (XALATAN) 0.005 % ophthalmic solution SMARTSIG:In Eye(s)    . losartan (COZAAR) 100 MG tablet Take 100 mg by mouth daily.    Marland Kitchen LOVAZA 1 G capsule Take 1 capsule by mouth 2 (two) times daily.     . metoprolol (LOPRESSOR) 50 MG tablet Take 25 mg by mouth 2 (two) times daily.    . NON FORMULARY Eye drop lantaprost 125 mg 1 drop both eyes at hs    . Omega-3 Fatty Acids (FISH OIL) 1000 MG CAPS Take by mouth.    Marland Kitchen omeprazole (PRILOSEC) 20 MG capsule Take 20 mg by mouth 2 (two) times daily before a meal.    . QUEtiapine (SEROQUEL) 50 MG tablet Take 50 mg by mouth at bedtime as needed for depression. Ordered to take 1 and 1/2 tabs prn qhs Taking 25 mg prn qhs prn    . rosuvastatin (CRESTOR) 10 MG tablet Take 10 mg by mouth daily.     . sildenafil (VIAGRA) 100 MG tablet Take by mouth.    . traMADol (  ULTRAM) 50 MG tablet Take 1 tablet (50 mg total) by mouth every 6 (six) hours as needed. 30 tablet 0  . vitamin C (ASCORBIC ACID) 250 MG tablet Take 250 mg by mouth daily.    Marland Kitchen aspirin 81 MG EC tablet  (Patient not taking: Reported on 06/29/2020)    . Cholecalciferol 25 MCG (1000 UT) tablet  (Patient not taking: Reported on 06/29/2020)    . Magnesium 400 MG CAPS Take by mouth daily. (Patient not taking: Reported on 06/29/2020)    . Multiple Vitamins-Minerals (MULTIVITAMIN WITH MINERALS) tablet Take 1 tablet by mouth daily. Centrum adult 50 plus (Patient not taking: Reported on 06/29/2020)    . neomycin-bacitracin-polymyxin (NEOSPORIN) ointment Apply 1 application topically every 12 (twelve) hours. To right shin daily  (Patient not taking: Reported on 06/29/2020)    . nitroGLYCERIN (NITROSTAT) 0.4 MG SL tablet Place 0.4 mg under the tongue every 5 (five) minutes as needed for chest pain.  (Patient not taking: Reported on 06/29/2020)    . sertraline (ZOLOFT) 100 MG tablet Take 100 mg by mouth daily as needed (PTSD).  (Patient not taking: Reported on 06/29/2020)    . tamsulosin (FLOMAX) 0.4 MG CAPS capsule TAKE ONE CAPSULE BY MOUTH AT BEDTIME FOR PROSTATE(TO HELP WITH URINATION) (Patient not taking: Reported on 06/29/2020)     No current facility-administered medications for this encounter.    Physical Findings: In general this is a well appearing African-American male in no acute distress. He's alert and oriented x4 and appropriate throughout the examination. Cardiopulmonary assessment is negative for acute distress and he exhibits normal effort.   Lab Findings: Lab Results  Component Value Date   WBC 4.4 05/30/2020   HGB 12.5 (L) 05/30/2020   HCT 36.6 (L) 05/30/2020   MCV 97.1 05/30/2020   PLT 170 05/30/2020    Radiographic Findings:  Patient underwent CT imaging in our clinic for post implant dosimetry. The CT will be reviewed by Dr. Tammi Klippel to confirm there is an adequate distribution of radioactive seeds throughout the prostate gland and ensure that there are no seeds in or near the rectum. We suspect the final radiation plan and dosimetry will show appropriate coverage of the prostate gland. He understands that we will call and inform him of any unexpected findings on further review of his imaging and dosimetry.  Impression/Plan:  75 y.o. gentleman with Stage T1c adenocarcinoma of the prostate with Gleason score of 3+4, and PSA of 12. The patient is recovering from the effects of radiation. His urinary symptoms should gradually improve over the next 4-6 months. We talked about this today. He is encouraged by his improvement already and is otherwise pleased with his outcome. We also talked about long-term  follow-up for prostate cancer following seed implant. He understands that ongoing PSA determinations and digital rectal exams will help perform surveillance to rule out disease recurrence. He has a follow up appointment scheduled with Dr. Alyson Ingles on 07/19/2020. He understands what to expect with his PSA measures. Patient was also educated today about some of the long-term effects from radiation including a small risk for rectal bleeding and possibly erectile dysfunction. We talked about some of the general management approaches to these potential complications. However, I did encourage the patient to contact our office or return at any point if he has questions or concerns related to his previous radiation and prostate cancer.    Nicholos Johns, PA-C

## 2020-06-30 ENCOUNTER — Encounter (HOSPITAL_BASED_OUTPATIENT_CLINIC_OR_DEPARTMENT_OTHER): Payer: Self-pay | Admitting: Urology

## 2020-07-10 ENCOUNTER — Ambulatory Visit
Admission: RE | Admit: 2020-07-10 | Discharge: 2020-07-10 | Disposition: A | Discharge status: Home / Self Care | Payer: Medicare PPO | Source: Ambulatory Visit | Attending: Radiation Oncology | Admitting: Radiation Oncology

## 2020-07-10 ENCOUNTER — Encounter: Payer: Self-pay | Admitting: Radiation Oncology

## 2020-07-10 DIAGNOSIS — C61 Malignant neoplasm of prostate: Secondary | ICD-10-CM | POA: Insufficient documentation

## 2020-07-11 NOTE — Progress Notes (Signed)
  Radiation Oncology         [phone]) [phone] ________________________________  Name: Tjay Velazquez MRN: [id number]  Date: 07/10/2020  DOB: [birth year]  3D Planning Note   Prostate Brachytherapy Post-Implant Dosimetry  Diagnosis: 75 y.o. gentleman with Stage T1c adenocarcinoma of the prostate with Gleason score of 3+4, and PSA of 12.  Narrative: On a previous date, Reynol Arnone returned following prostate seed implantation for post implant planning. He underwent CT scan complex simulation to delineate the three-dimensional structures of the pelvis and demonstrate the radiation distribution.  Since that time, the seed localization, and complex isodose planning with dose volume histograms have now been completed.  Results:   Prostate Coverage - The dose of radiation delivered to the 90% or more of the prostate gland (D90) was 92.64% of the prescription dose. This exceeds our goal of greater than 90%. Rectal Sparing - The volume of rectal tissue receiving the prescription dose or higher was 0.0 cc. This falls under our thresholds tolerance of 1.0 cc.  Impression: The prostate seed implant appears to show adequate target coverage and appropriate rectal sparing.  Plan:  The patient will continue to follow with urology for ongoing PSA determinations. I would anticipate a high likelihood for local tumor control with minimal risk for rectal morbidity.  ________________________________  Sheral Apley Tammi Klippel, M.D.

## 2020-07-19 ENCOUNTER — Encounter: Payer: Self-pay | Admitting: Urology

## 2020-07-19 ENCOUNTER — Ambulatory Visit (INDEPENDENT_AMBULATORY_CARE_PROVIDER_SITE_OTHER): Payer: Medicare PPO | Admitting: Urology

## 2020-07-19 ENCOUNTER — Other Ambulatory Visit: Payer: Self-pay

## 2020-07-19 VITALS — BP 159/72 | HR 66 | Temp 98.3°F | Ht 69.0 in | Wt 167.0 lb

## 2020-07-19 DIAGNOSIS — R351 Nocturia: Secondary | ICD-10-CM | POA: Diagnosis not present

## 2020-07-19 DIAGNOSIS — N401 Enlarged prostate with lower urinary tract symptoms: Secondary | ICD-10-CM

## 2020-07-19 DIAGNOSIS — N138 Other obstructive and reflux uropathy: Secondary | ICD-10-CM | POA: Diagnosis not present

## 2020-07-19 DIAGNOSIS — C61 Malignant neoplasm of prostate: Secondary | ICD-10-CM | POA: Diagnosis not present

## 2020-07-19 LAB — URINALYSIS, ROUTINE W REFLEX MICROSCOPIC
Bilirubin, UA: NEGATIVE
Glucose, UA: NEGATIVE
Ketones, UA: NEGATIVE
Leukocytes,UA: NEGATIVE
Nitrite, UA: NEGATIVE
Specific Gravity, UA: 1.02 (ref 1.005–1.030)
Urobilinogen, Ur: 0.2 mg/dL (ref 0.2–1.0)
pH, UA: 6.5 (ref 5.0–7.5)

## 2020-07-19 LAB — BLADDER SCAN AMB NON-IMAGING: Scan Result: 49

## 2020-07-19 LAB — MICROSCOPIC EXAMINATION
Renal Epithel, UA: NONE SEEN /hpf
WBC, UA: NONE SEEN /hpf (ref 0–5)

## 2020-07-19 MED ORDER — ALFUZOSIN HCL ER 10 MG PO TB24: 10.0000 mg | ORAL_TABLET | Freq: Every day | ORAL | 11 refills | Status: DC

## 2020-07-19 NOTE — Progress Notes (Signed)
Bladder Scan Patient can void: 49 ml Performed By: Durenda Guthrie, lpn Urological Symptom Review  Patient is experiencing the following symptoms: Frequent urination Get up at night to urinate Leakage of urine Stream starts and stops Trouble starting stream Blood in urine Weak stream Erection problems (male only)   Review of Systems  Gastrointestinal (upper)  : Indigestion/heartburn  Gastrointestinal (lower) : Diarrhea Constipation  Constitutional : Negative for symptoms  Skin: Skin rash/lesion  Eyes: Negative for eye symptoms  Ear/Nose/Throat : Sinus problems  Hematologic/Lymphatic: Negative for Hematologic/Lymphatic symptoms  Cardiovascular : Leg swelling  Respiratory : Negative for respiratory symptoms  Endocrine: Negative for endocrine symptoms  Musculoskeletal: Joint pain  Neurological: Negative for neurological symptoms  Psychologic: Negative for psychiatric symptoms

## 2020-07-19 NOTE — Patient Instructions (Signed)

## 2020-07-19 NOTE — Progress Notes (Signed)
07/19/2020 10:21 AM   Donald Klein 06-Dec-1945 [id number]  Referring provider: [name], MD [place],  [place] [address]  followup BPH and nocturia  HPI: [name] is a 75yo here for followup for [facility] with Nocturia. IPSS 17 with QOL 3. Stream is strong. He has bothersome urinary frequency and urgency. He stopped the flomax and uroxatral. He is unhappy with his urination. No other complaints today. He has a stronger urine stream when he drinks water   PMH: Past Medical History:  Diagnosis Date  . Anemia   . Anxiety   . Barrett esophagus    Last EGD 05/27/08 Barrett's without dyplasia. EGD due 05/2011  . Chronic back pain   . Colitis    In 1980s, dx with UC by Dr. Humphrey Klein at time of TCS, 1992 TCS, chronic colitis  . Depression   . DM type 2 (diabetes mellitus, type 2) ([place])   . Fatty liver     small spots on liver also  . Fibromyalgia   . GERD (gastroesophageal reflux disease)   . Helicobacter pylori gastritis 2009   treated  . History of hiatal hernia   . HTN (hypertension)   . Hydrocele   . Hyperlipidemia   . IBS (irritable bowel syndrome)   . 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 Perceptive hearing loss, both sides    weras hearing aides both ears  . Prostate cancer ([place])   . PTSD (post-traumatic stress disorder)   . PTSD (post-traumatic stress disorder)   . Pulmonary nodules chest ct 08-25-2019   stable per chest ct  . Skin abnormality 05/31/2020   sore on right shin x 1 week no drainage healing wife putting neosporin on daily  . Sleep apnea    no cpap did not tolerate cpap   . 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 Tubular adenoma of colon 10/08/10   Last colonosocpy 1 cecal TA  . Wears glasses     Surgical History: Past Surgical History:  Procedure Laterality Date  . APPENDECTOMY  July 2014   at time of small bowel resection  . BIOPSY  01/06/2018   Procedure:  BIOPSY;  Surgeon: Donald Dolin, MD;  [facility];  Service: Endoscopy;;  gastric biopsy , esophageal biopsy  . BIOPSY  10/29/2018   Procedure: BIOPSY;  Surgeon: Donald Dolin, MD;  [facility];  Service: Endoscopy;;  gastric, esophageal  . CARDIAC CATHETERIZATION  2001   saw cardiology for 1 year and released  . CHOLECYSTECTOMY  1980's or 1990's  . COLONOSCOPY  10/08/2010   Normal rectum/tubular adenoma/normal random biopsy. Next TCS due 10/2015.  Marland Kitchen COLONOSCOPY N/A 09/30/2014   Dr. Gala Klein: Redunant colon . Status post segmental biopsy and stool sampling. Negative stool samples and colonic biopsies.   . COLONOSCOPY WITH PROPOFOL N/A 10/29/2018   Procedure: COLONOSCOPY WITH PROPOFOL;  Surgeon: Donald Dolin, MD;  [facility];  Service: Endoscopy;  Laterality: N/A;  12:00pm  . ESOPHAGOGASTRODUODENOSCOPY  07/31/2011   Dr. Doyce Klein segment Barrett's esophagus s/p bx Hiatal hernia. Duodenal diverticulum. No dysplasia.Next EGD 07/2014.  Marland Kitchen ESOPHAGOGASTRODUODENOSCOPY N/A 09/30/2014   Dr. Gala Klein: Abnormal distal esophagus consistant with prior diagnosis of short segment Barrett's esophagus status post biopsy. 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 Hiatal hernia. Abnormal Gastric mucosa of uncertain significance status post gastric biopsy. path with +Barrett's esophagus but no dysplasia,  mild chronic gastritis. Surveillance for Barrett's due in 2019  . ESOPHAGOGASTRODUODENOSCOPY N/A 01/06/2018   Procedure: ESOPHAGOGASTRODUODENOSCOPY (EGD);  Surgeon: Donald Dolin, MD;  [facility];  Service: Endoscopy;  Laterality: N/A;  12:00pm  . ESOPHAGOGASTRODUODENOSCOPY (EGD) WITH PROPOFOL N/A 10/29/2018   Procedure: ESOPHAGOGASTRODUODENOSCOPY (EGD) WITH PROPOFOL;  Surgeon: Donald Dolin, MD;  [facility];  Service: Endoscopy;  Laterality: N/A;  . EXPLORATORY LAPAROTOMY W/ BOWEL RESECTION  July 2014   [facility]: with lysis of adhesions, small bowel  resection X 2, resection of mesenteric mass, appendectomy, path: fibromatosis   . 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 PROSTATE BIOPSY  2021  . RADIOACTIVE SEED IMPLANT N/A 06/02/2020   Procedure: RADIOACTIVE SEED IMPLANT/BRACHYTHERAPY IMPLANT;  Surgeon: Donald Gustin, MD;  Location: [facility];  Service: Urology;  Laterality: N/A;  . SPACE OAR INSTILLATION N/A 06/02/2020   Procedure: SPACE OAR INSTILLATION;  Surgeon: Donald Gustin, MD;  Location[facility];  Service: Urology;  Laterality: N/A;    Home Medications:  Allergies as of 07/19/2020      Reactions   Lisinopril Other (See Comments)   Pt unsure    Metformin Diarrhea   Enoxaparin Rash   Other reaction(s): Eruption of skin   Lovenox [enoxaparin Sodium] Hives, Rash   Penicillins Rash    Has patient had a PCN reaction causing immediate rash, facial/tongue/throat swelling, SOB or lightheadedness with hypotension: Yes Has patient had a PCN reaction causing severe rash involving mucus membranes or skin necrosis: No Has patient had a PCN reaction that required hospitalization No Has patient had a PCN reaction occurring within the last 10 years: No If all of the above answers are "NO", then may proceed with Cephalosporin use.      Medication List       Accurate as of July 19, 2020 10:21 AM. If you have any questions, ask your nurse or doctor.        alfuzosin 10 MG 24 hr tablet Commonly known as: UROXATRAL Take 1 tablet (10 mg total) by mouth daily with breakfast.   aspirin 81 MG EC tablet   Cholecalciferol 25 MCG (1000 UT) tablet   diazepam 5 MG tablet Commonly known as: VALIUM Take 5 mg by mouth daily as needed for anxiety.   DRY EYES OP Place 1 drop  into both eyes 2 (two) times daily as needed (dry eyes).   ferrous sulfate 325 (65 FE) MG tablet Take 325 mg by mouth. Monday Wednesday Friday   Fish Oil 1000 MG Caps Take by mouth.   gabapentin 400 MG capsule Commonly known as: NEURONTIN Take 400 mg by mouth at bedtime.   hydrocortisone 2.5 % rectal cream Commonly known as: ANUSOL-HC Place 1 application rectally 2 (two) times daily. For up to 10 days at a time   imatinib 400 MG tablet Commonly known as: GLEEVEC Take 1 tablet by mouth at bedtime.   Januvia 100 MG tablet Generic drug: sitaGLIPtin Take 100 mg by mouth daily.   latanoprost 0.005 % ophthalmic solution Commonly known as: XALATAN SMARTSIG:In Eye(s)   losartan 100 MG tablet Commonly known as: COZAAR Take 100 mg by mouth daily.   Lovaza 1 g capsule Generic drug: omega-3 acid ethyl esters Take  1 capsule by mouth 2 (two) times daily.   Magnesium 400 MG Caps Take by mouth daily.   metoprolol tartrate 50 MG tablet Commonly known as: LOPRESSOR Take 25 mg by mouth 2 (two) times daily.   multivitamin with minerals tablet Take 1 tablet by mouth daily. Centrum adult 50 plus   neomycin-bacitracin-polymyxin ointment Commonly known as: NEOSPORIN Apply 1 application topically every 12 (twelve) hours. To right shin daily   nitroGLYCERIN 0.4 MG SL tablet Commonly known as: NITROSTAT Place 0.4 mg under the tongue every 5 (five) minutes as needed for chest pain.   NON FORMULARY Eye drop lantaprost 125 mg 1 drop both eyes at hs   omeprazole 20 MG capsule Commonly known as: PRILOSEC Take 20 mg by mouth 2 (two) times daily before a meal.   QUEtiapine 50 MG tablet Commonly known as: SEROQUEL Take 50 mg by mouth at bedtime as needed for depression. Ordered to take 1 and 1/2 tabs prn qhs Taking 25 mg prn qhs prn   rosuvastatin 10 MG tablet Commonly known as: CRESTOR Take 10 mg by mouth daily.   sertraline 100 MG tablet Commonly known as: ZOLOFT Take 100 mg by  mouth daily as needed (PTSD).   sildenafil 100 MG tablet Commonly known as: VIAGRA Take by mouth.   tamsulosin 0.4 MG Caps capsule Commonly known as: FLOMAX TAKE ONE CAPSULE BY MOUTH AT BEDTIME FOR PROSTATE(TO HELP WITH URINATION)   traMADol 50 MG tablet Commonly known as: Ultram Take 1 tablet (50 mg total) by mouth every 6 (six) hours as needed.   vitamin C 250 MG tablet Commonly known as: ASCORBIC ACID Take 250 mg by mouth daily.       Allergies:  Allergies  Allergen Reactions  . Lisinopril Other (See Comments)    Pt unsure   . Metformin Diarrhea  . Enoxaparin Rash    Other reaction(s): Eruption of skin  . Lovenox [Enoxaparin Sodium] Hives and Rash  . Penicillins Rash     Has patient had a PCN reaction causing immediate rash, facial/tongue/throat swelling, SOB or lightheadedness with hypotension: Yes Has patient had a PCN reaction causing severe rash involving mucus membranes or skin necrosis: No Has patient had a PCN reaction that required hospitalization No Has patient had a PCN reaction occurring within the last 10 years: No If all of the above answers are "NO", then may proceed with Cephalosporin use.     Family History: Family History  Problem Relation Age of Onset  . Heart disease Mother   . Suicidality Mother   . Heart attack Father        38  . Heart disease Father   . Heart disease Sister   . Prostate cancer Brother   . Colon cancer Neg Hx   . Breast cancer Neg Hx   . Pancreatic cancer Neg Hx     Social History:  reports that he has never smoked. He has never used smokeless tobacco. He reports that he does not drink alcohol and does not use drugs.  ROS: All other review of systems were reviewed and are negative except what is noted above in HPI  Physical Exam: BP (!) 159/72   Pulse 66   Temp 98.3 F (36.8 C)   Ht 5\' 9"  (1.753 m)   Wt 167 lb (75.8 kg)   BMI 24.66 kg/m   Constitutional:  Alert and oriented, No acute distress. HEENT:  AT,  moist mucus membranes.  Trachea midline, no masses. Cardiovascular: No  clubbing, cyanosis, or edema. Respiratory: Normal respiratory effort, no increased work of breathing. GI: Abdomen is soft, nontender, nondistended, no abdominal masses GU: No CVA tenderness.  Lymph: No cervical or inguinal lymphadenopathy. Skin: No rashes, bruises or suspicious lesions. Neurologic: Grossly intact, no focal deficits, moving all 4 extremities. Psychiatric: Normal mood and affect.  Laboratory Data: Lab Results  Component Value Date   WBC 4.4 05/30/2020   HGB 12.5 (L) 05/30/2020   HCT 36.6 (L) 05/30/2020   MCV 97.1 05/30/2020   PLT 170 05/30/2020    Lab Results  Component Value Date   CREATININE 1.29 (H) 05/30/2020    Lab Results  Component Value Date   PSA 12.0 (H) 11/22/2019    No results found for: TESTOSTERONE  No results found for: HGBA1C  Urinalysis    Component Value Date/Time   COLORURINE YELLOW 07/12/2018 1158   APPEARANCEUR Clear 03/08/2020 1632   LABSPEC 1.012 07/12/2018 1158   PHURINE 5.0 07/12/2018 1158   GLUCOSEU Negative 03/08/2020 1632   HGBUR NEGATIVE 07/12/2018 1158   BILIRUBINUR Negative 03/08/2020 1632   KETONESUR NEGATIVE 07/12/2018 1158   PROTEINUR 2+ (A) 03/08/2020 1632   PROTEINUR NEGATIVE 07/12/2018 1158   UROBILINOGEN 0.2 03/29/2010 0803   NITRITE Negative 03/08/2020 1632   NITRITE NEGATIVE 07/12/2018 1158   LEUKOCYTESUR Negative 03/08/2020 1632   LEUKOCYTESUR NEGATIVE 07/12/2018 1158    Lab Results  Component Value Date   LABMICR See below: 03/08/2020   WBCUA None seen 03/08/2020   LABEPIT None seen 03/08/2020   MUCUS Present 03/08/2020   BACTERIA None seen 03/08/2020    Pertinent Imaging:  No results found for this or any previous visit.  No results found for this or any previous visit.  No results found for this or any previous visit.  No results found for this or any previous visit.  No results found for this or any previous  visit.  No results found for this or any previous visit.  No results found for this or any previous visit.  No results found for this or any previous visit.   Assessment & Plan:    1. Prostate cancer ([place]) -RTC 3 months with PSA - BLADDER SCAN AMB NON-IMAGING - Urinalysis, Routine w reflex microscopic  2. Benign prostatic hyperplasia with urinary obstruction -restart uroxatral 10mg   3. Nocturia -restart uroxatral 10mg  qhs   No follow-ups on file.  Nicolette Bang, MD  [facility] [place]

## 2020-07-26 DIAGNOSIS — E119 Type 2 diabetes mellitus without complications: Secondary | ICD-10-CM | POA: Insufficient documentation

## 2020-08-09 ENCOUNTER — Ambulatory Visit: Payer: Medicare PPO | Admitting: Gastroenterology

## 2020-08-09 NOTE — Progress Notes (Signed)
Referring Provider: [name], MD Primary Care Physician:  [name], MD Primary GI Physician: Dr. Gala Romney  Chief Complaint  Patient presents with  . Abdominal Pain    Mid upper abd/chest    HPI:   Donald Klein is a 75 y.o. male presenting today with chief complaint of upper abdominal pain.  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 Also with history of GERD, Barrett's esophagus, chronic diarrhea, and colonic adenomas.  Last EGD and colonoscopy in June 2020.  Colonoscopy with markedly redundant colon with benign-appearing noncritical colonic stricture, inability to identify landmarks of cecum and ileocecal valve.  EGD with appearance of Barrett's esophagus though biopsy with reactive/regenerative changes, moderate gastric mucosa with some deformity likely related to prior surgery s/p biopsy (chronic gastritis), normal examined duodenum.  Last seen in our office 04/13/2019.  He apparently had a virtual colonoscopy which demonstrated no concerning findings.  Diarrhea had resolved since stopping Metformin.  GERD well controlled on omeprazole 20 mg BID.  Did not recommend repeat EGD or colonoscopy.  Today: Developed epigastric abdominal pain after he ran out of omeprazole for several weeks.  He recently started back on omeprazole 20 mg daily rather than twice daily as twice daily worsened his chronic diarrhea.  Since resuming omeprazole daily, he has had improvement in upper abdominal pain.  Also states he was eating a lot of tomatoes, ketchup, and onions and noticed this worsened his pain.  He felt well last night and this morning and does not have any pain at this time.  He was also having a lot of GERD symptoms while off omeprazole, but this seems to be doing okay right now.  Denies bright red  blood per rectum or melena.  States his stools can get dark as he is on iron.  Denies nausea, vomiting, dysphagia.  Denies NSAIDs.  He has chronic history of diarrhea dating back to the 74s.  Can have 6 or 7 bowel movements daily that are watery.  Diarrhea is worsened by salads, omeprazole, and he thinks Gleevec may also be contributing.  Diarrhea has been even worse when he used to be on Metformin.  Reports when he was off of omeprazole, his diarrhea improved.  Also reports when he does any salads, his stools become more solid.  He limits dairy.  He will use 1 Imodium if going out or traveling and this cuts back his bowel movements to 1 a day.  CT scheduled for 4/12 with [facility].     Past Medical History:  Diagnosis Date  . Anemia   . Anxiety   . Barrett esophagus    Last EGD 05/27/08 Barrett's without dyplasia. EGD due 05/2011  . Chronic back pain   . Colitis    In 1980s, dx with UC by Dr. Humphrey Rolls at time of TCS, 1992 TCS, chronic colitis  . Depression   . DM type 2 (diabetes mellitus, type 2) ([place])   . Fatty liver     small spots on liver also  . Fibromyalgia   . GERD (gastroesophageal reflux disease)   . GIST, malignant ([place])    Recurrent, on Gleevec, followed by [facility]  . Helicobacter pylori gastritis 2009   treated  . History of hiatal hernia   . HTN (hypertension)   . Hydrocele   . Hyperlipidemia   .  IBS (irritable bowel syndrome)   . 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 Perceptive hearing loss, both sides    weras hearing aides both ears  . Prostate cancer ([place])   . PTSD (post-traumatic stress disorder)   . PTSD (post-traumatic stress disorder)   . Pulmonary nodules chest ct 08-25-2019   stable per chest ct  . Skin abnormality 05/31/2020   sore on right shin x 1 week no drainage healing wife putting neosporin on daily  . Sleep apnea    no cpap did not tolerate cpap   . Small bowel tumor    presented with SBO (distal-near TI), path showed spindle cell  neoplasm 6.5cm  . Tubular adenoma of colon 10/08/10   Last colonosocpy 1 cecal TA  . Wears glasses     Past Surgical History:  Procedure Laterality Date  . APPENDECTOMY  July 2014   at time of small bowel resection  . BIOPSY  01/06/2018   Procedure: BIOPSY;  Surgeon: Daneil Dolin, MD;  [facility]: [facility];  Service: Endoscopy;;  gastric biopsy , esophageal biopsy  . BIOPSY  10/29/2018   Procedure: BIOPSY;  Surgeon: Daneil Dolin, MD;  Location: [facility];  Service: Endoscopy;;  gastric, esophageal  . CARDIAC CATHETERIZATION  2001   saw cardiology for 1 year and released  . CHOLECYSTECTOMY  1980's or 1990's  . COLONOSCOPY  10/08/2010   Normal rectum/tubular adenoma/normal random biopsy. Next TCS due 10/2015.  Marland Kitchen COLONOSCOPY N/A 09/30/2014   Dr. Gala Romney: Redunant colon . Status post segmental biopsy and stool sampling. Negative stool samples and colonic biopsies.   . COLONOSCOPY WITH PROPOFOL N/A 10/29/2018   Surgeon: Daneil Dolin, MD; markedly redundant colon with benign-appearing noncritical colonic stricture, inability to identify landmarks of cecum and ileocecal valve.  Apparently patient had virtual colonoscopy thereafter.  Dr. Gala Romney did not recommend repeating colonoscopy.  . ESOPHAGOGASTRODUODENOSCOPY  07/31/2011   Dr. Doyce Para segment Barrett's esophagus s/p bx Hiatal hernia. Duodenal diverticulum. No dysplasia.Next EGD 07/2014.  Marland Kitchen ESOPHAGOGASTRODUODENOSCOPY N/A 09/30/2014   Dr. Gala Romney: Abnormal distal esophagus consistant with prior diagnosis of short segment Barrett's esophagus status post biopsy. 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 Hiatal hernia. Abnormal Gastric mucosa of uncertain significance status post gastric biopsy. path with +Barrett's esophagus but no dysplasia, mild chronic gastritis. Surveillance for Barrett's due in 2019  . ESOPHAGOGASTRODUODENOSCOPY N/A 01/06/2018   Procedure: ESOPHAGOGASTRODUODENOSCOPY (EGD);  Surgeon: Daneil Dolin, MD;  Location[facility];  Service: Endoscopy;  Laterality: N/A;  12:00pm  . ESOPHAGOGASTRODUODENOSCOPY (EGD) WITH PROPOFOL N/A 10/29/2018    Surgeon: Daneil Dolin, MD;  appearance of Barrett's esophagus though biopsy with reactive/regenerative changes, moderate gastric mucosa with some deformity likely related to prior surgery s/p biopsy (chronic gastritis), normal examined duodenum.  . EXPLORATORY LAPAROTOMY W/ BOWEL RESECTION  July 2014   [facility]: with lysis of adhesions, small bowel resection X 2, resection of mesenteric mass, appendectomy, path: fibromatosis   . 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 PROSTATE BIOPSY  2021  . RADIOACTIVE SEED IMPLANT N/A 06/02/2020   Procedure: RADIOACTIVE SEED IMPLANT/BRACHYTHERAPY IMPLANT;  Surgeon: [name], MD;  Location: [facility];  Service: Urology;  Laterality: N/A;  . SPACE OAR INSTILLATION N/A 06/02/2020  Procedure: SPACE OAR INSTILLATION;  Surgeon: [name], MD;  Location: [facility];  Service: Urology;  Laterality: N/A;    Current Outpatient Medications  Medication Sig Dispense Refill  . alfuzosin (UROXATRAL) 10 MG 24 hr tablet Take 1 tablet (10 mg total) by mouth daily with breakfast. 30 tablet 11  . Artificial Tear Ointment (DRY EYES OP) Place 1 drop into both eyes 2 (two) times daily as needed (dry eyes).     Marland Kitchen aspirin 81 MG EC tablet     . Cholecalciferol 25 MCG (1000 UT) tablet Take by mouth daily.    . diazepam (VALIUM) 5 MG tablet Take 5 mg by mouth daily as needed for anxiety.    Marland Kitchen esomeprazole (NEXIUM) 20 MG capsule Take 1 capsule (20 mg total) by mouth 2 (two) times daily before a meal. 60 capsule 5  . ferrous sulfate 325 (65 FE) MG tablet Take 325 mg by mouth. Monday  Wednesday Friday    . gabapentin (NEURONTIN) 400 MG capsule Take 400 mg by mouth at bedtime.    . hydrocortisone (ANUSOL-HC) 2.5 % rectal cream Place 1 application rectally 2 (two) times daily. For up to 10 days at a time 30 g 2  . imatinib (GLEEVEC) 400 MG tablet Take 1 tablet by mouth at bedtime.    Marland Kitchen JANUVIA 100 MG tablet Take 100 mg by mouth daily.    Marland Kitchen latanoprost (XALATAN) 0.005 % ophthalmic solution SMARTSIG:In Eye(s)    . loperamide (IMODIUM) 2 MG capsule Take by mouth as needed for diarrhea or loose stools.    Marland Kitchen losartan (COZAAR) 100 MG tablet Take 100 mg by mouth daily.    Marland Kitchen LOVAZA 1 G capsule Take 1 capsule by mouth 2 (two) times daily.     . Magnesium 400 MG CAPS Take by mouth daily.    . metoprolol (LOPRESSOR) 50 MG tablet Take 25 mg by mouth 2 (two) times daily.    . Multiple Vitamins-Minerals (MULTIVITAMIN WITH MINERALS) tablet Take 1 tablet by mouth daily. Centrum adult 50 plus    . neomycin-bacitracin-polymyxin (NEOSPORIN) ointment Apply 1 application topically every 12 (twelve) hours. To right shin daily    . nitroGLYCERIN (NITROSTAT) 0.4 MG SL tablet Place 0.4 mg under the tongue every 5 (five) minutes as needed for chest pain.    . NON FORMULARY Eye drop lantaprost 125 mg 1 drop both eyes at hs    . Omega-3 Fatty Acids (FISH OIL) 1000 MG CAPS Take by mouth in the morning and at bedtime.    Marland Kitchen QUEtiapine (SEROQUEL) 50 MG tablet Take 50 mg by mouth at bedtime as needed for depression. Ordered to take 1 and 1/2 tabs prn qhs Taking 25 mg prn qhs prn    . rosuvastatin (CRESTOR) 10 MG tablet Take 10 mg by mouth daily.     . sertraline (ZOLOFT) 100 MG tablet Take 100 mg by mouth daily as needed (PTSD).    Marland Kitchen sildenafil (VIAGRA) 100 MG tablet Take by mouth.    . traMADol (ULTRAM) 50 MG tablet Take 1 tablet (50 mg total) by mouth every 6 (six) hours as needed. 30 tablet 0  . vitamin C (ASCORBIC ACID) 250 MG tablet Take 250 mg by mouth daily.     No current facility-administered  medications for this visit.    Allergies as of 08/10/2020 - Review Complete 08/10/2020  Allergen Reaction Noted  . Lisinopril Other (See Comments) 12/29/2017  . Metformin Diarrhea 03/05/2019  . Enoxaparin  Rash 01/01/2013  . Lovenox [enoxaparin sodium] Hives and Rash 09/30/2014  . Penicillins Rash     Family History  Problem Relation Age of Onset  . Heart disease Mother   . Suicidality Mother   . Heart attack Father        72  . Heart disease Father   . Heart disease Sister   . Prostate cancer Brother   . Colon cancer Neg Hx   . Breast cancer Neg Hx   . Pancreatic cancer Neg Hx     Social History   Socioeconomic History  . Marital status: Married    Spouse name: Not on file  . Number of children: 1  . Years of education: Not on file  . Highest education level: Not on file  Occupational History  . Occupation: disability  Tobacco Use  . Smoking status: Never Smoker  . Smokeless tobacco: Never Used  Vaping Use  . Vaping Use: Never used  Substance and Sexual Activity  . Alcohol use: No    Alcohol/week: 0.0 standard drinks  . Drug use: No  . Sexual activity: Yes    Partners: Female    Birth control/protection: None    Comment: spouse  Other Topics Concern  . Not on file  Social History Narrative  . Not on file   Social Determinants of Health   Financial Resource Strain: Not on file  Food Insecurity: Not on file  Transportation Needs: Not on file  Physical Activity: Not on file  Stress: Not on file  Social Connections: Not on file    Review of Systems: Gen: Denies fever, chills, cold or flulike symptoms, lightheadedness, dizziness, presyncope, syncope..  CV: Denies chest pain or palpitations. Resp: Denies dyspnea or cough. GI: See HPI Heme: See HPI  Physical Exam: BP (!) 149/67   Pulse 63   Temp (!) 96.9 F (36.1 C) (Temporal)   Ht 5\' 9"  (1.753 m)   Wt 174 lb 3.2 oz (79 kg)   BMI 25.72 kg/m  General:   Alert and oriented. No distress noted.  Pleasant and cooperative.  Head:  Normocephalic and atraumatic. Eyes:  Conjuctiva clear without scleral icterus. Heart:  S1, S2 present without murmurs appreciated. Lungs:  Clear to auscultation bilaterally. No wheezes, rales, or rhonchi. No distress.  Abdomen:  +BS, soft, non-tender and non-distended. No rebound or guarding. No HSM or masses noted. Msk:  Symmetrical without gross deformities. Normal posture. Extremities:  Without edema. Neurologic:  Alert and  oriented x4 Psych: Normal mood and affect.   Assessment: 75 year old male with history of recurrent and at least locally metastatic GIST tumor followed by [facility] and currently on [place], GERD, Barrett's esophagus though no Barrett's on EGD pathology in June 2020, and chronic diarrhea who is presenting today with chief complaint of epigastric abdominal pain after running out of omeprazole, 20 mg twice daily, for several weeks.  He also had significant GERD symptoms during this time.  He has resumed taking omeprazole 20 mg daily rather than twice daily, as twice daily worsens his chronic diarrhea, and since then, his epigastric pain and GERD symptoms are improving.  Today, he has not had any epigastric pain and his abdominal exam is benign.  Denies BRBPR or melena though stools can be dark at times on iron.  Denies NSAIDs.  He has upcoming CT on 4/12 with [facility] to follow-up on GIST.  Suspect patient was likely dealing with a GERD flare and possibly gastritis.  As  he reports omeprazole worsens his chronic diarrhea, we will try him on Nexium instead. Hold off on EGD unless symptoms do not improve.   Chronic diarrhea: Patient reports watery diarrhea dating back to the 86s.  Suspect this is likely influenced by bile salt diarrhea s/p cholecystectomy, dietary intolerances, and med effect with omeprazole, magnesium, Gleevec, and Januvia. No alarm symptoms. Responds well to 1 imodium daily if needed. Colonoscopy up to date  in 2020 with no recommendations to repeat.  Doubt infectious diarrhea.  Last stool studies in our system in 2016 were negative.  We will focus on dietary changes, try adjusting PPI, and see how he does.  If persistent watery diarrhea, consider updating stool studies, TSH, and screening for celiac disease for completeness.   Plan: 1.  Stop omeprazole and start Nexium 20 mg twice daily 30 minutes before breakfast and dinner.  2.  Counseled on GERD diet/lifestyle.  Written instructions provided.  3.  Recommended low-fat diet.  No fried foods.  All meats should be baked, boiled, or broiled.  4.  Try lactose-free diet or take Lactaid tablets prior to dairy consumption.  Lactose-free handout provided.  5.  Avoid eating a lot of high fibrous foods on a daily basis as he reports this worsens his diarrhea.  6.  Requested progress report in 4 weeks on upper abdominal pain and diarrhea.  7.  If persistent upper abdominal pain, consider EGD.  8.  If persistent diarrhea, we will update stool studies, TSH, and screen for celiac disease for completeness.  9.  Follow-up in 4 months or sooner if needed.    Aliene Altes, PA-C [facility] 08/10/2020

## 2020-08-10 ENCOUNTER — Other Ambulatory Visit: Payer: Self-pay

## 2020-08-10 ENCOUNTER — Ambulatory Visit (INDEPENDENT_AMBULATORY_CARE_PROVIDER_SITE_OTHER): Payer: Medicare PPO | Admitting: Gastroenterology

## 2020-08-10 ENCOUNTER — Encounter: Payer: Self-pay | Admitting: Gastroenterology

## 2020-08-10 VITALS — BP 149/67 | HR 63 | Temp 96.9°F | Ht 69.0 in | Wt 174.2 lb

## 2020-08-10 DIAGNOSIS — K529 Noninfective gastroenteritis and colitis, unspecified: Secondary | ICD-10-CM | POA: Diagnosis not present

## 2020-08-10 DIAGNOSIS — K219 Gastro-esophageal reflux disease without esophagitis: Secondary | ICD-10-CM | POA: Diagnosis not present

## 2020-08-10 DIAGNOSIS — R1013 Epigastric pain: Secondary | ICD-10-CM

## 2020-08-10 MED ORDER — ESOMEPRAZOLE MAGNESIUM 20 MG PO CPDR: 20.0000 mg | DELAYED_RELEASE_CAPSULE | Freq: Two times a day (BID) | ORAL | 5 refills | Status: DC

## 2020-08-10 NOTE — Patient Instructions (Addendum)
Stop omeprazole and start Nexium 20 mg twice daily 30 minutes before breakfast and dinner.  Follow a GERD diet:  Avoid fried, fatty, greasy, spicy, citrus foods. Avoid caffeine and carbonated beverages. Avoid chocolate. Try eating 4-6 small meals a day rather than 3 large meals. Do not eat within 3 hours of laying down. Prop head of bed up on wood or bricks to create a 6 inch incline.  To help limit diarrhea: Follow a low-fat diet.  All meats should be baked, boiled, or broiled.  No fried foods. Follow lactose-free diet or take Lactaid prior to dairy consumption. Avoid eating a lot of high fibrous foods.  Specifically, limit green leafy vegetables as this seems to worsen your diarrhea. We are changing your omeprazole to Nexium to see if this helps.  Please call us in 4 weeks to give [place] a progress report on how Nexium is working for you and let us know if you have any persistent diarrhea.  If so, we can order stool studies at that time.  We will follow-up in 4 months or sooner if needed.   Aliene Altes, PA-C [facility]    Food Choices for Gastroesophageal Reflux Disease, Adult When you have gastroesophageal reflux disease (GERD), the foods you eat and your eating habits are very important. Choosing the right foods can help ease the discomfort of GERD. Consider working with a dietitian to help you make healthy food choices. What are tips for following this plan? Reading food labels  Look for foods that are low in saturated fat. Foods that have less than 5% of daily value (DV) of fat and 0 g of trans fats may help with your symptoms. Cooking  Cook foods using methods other than frying. This may include baking, steaming, grilling, or broiling. These are all methods that do not need a lot of fat for cooking.  To add flavor, try to use herbs that are low in spice and acidity. Meal planning  Choose healthy foods that are low in fat, such as fruits, vegetables, whole  grains, low-fat dairy products, lean meats, fish, and poultry.  Eat frequent, small meals instead of three large meals each day. Eat your meals slowly, in a relaxed setting. Avoid bending over or lying down until 2-3 hours after eating.  Limit high-fat foods such as fatty meats or fried foods.  Limit your intake of fatty foods, such as oils, butter, and shortening.  Avoid the following as told by your health care provider: ? Foods that cause symptoms. These may be different for different people. Keep a food diary to keep track of foods that cause symptoms. ? Alcohol. ? Drinking large amounts of liquid with meals. ? Eating meals during the 2-3 hours before bed.   Lifestyle  Maintain a healthy weight. Ask your health care provider what weight is healthy for you. If you need to lose weight, work with your health care provider to do so safely.  Exercise for at least 30 minutes on 5 or more days each week, or as told by your health care provider.  Avoid wearing clothes that fit tightly around your waist and chest.  Do not use any products that contain nicotine or tobacco. These products include cigarettes, chewing tobacco, and vaping devices, such as e-cigarettes. If you need help quitting, ask your health care provider.  Sleep with the head of your bed raised. Use a wedge under the mattress or blocks under the bed frame to raise the head of the bed.  Chew sugar-free gum after mealtimes. What foods should I eat? Eat a healthy, well-balanced diet of fruits, vegetables, whole grains, low-fat dairy products, lean meats, fish, and poultry. Each person is different. Foods that may trigger symptoms in one person may not trigger any symptoms in another person. Work with your health care provider to identify foods that are safe for you. The items listed above may not be a complete list of recommended foods and beverages. Contact a dietitian for more information.   What foods should I avoid? Limiting  some of these foods may help manage the symptoms of GERD. Everyone is different. Consult a dietitian or your health care provider to help you identify the exact foods to avoid, if any. Fruits Any fruits prepared with added fat. Any fruits that cause symptoms. For some people this may include citrus fruits, such as oranges, grapefruit, pineapple, and lemons. Vegetables Deep-fried vegetables. [place] fries. Any vegetables prepared with added fat. Any vegetables that cause symptoms. For some people, this may include tomatoes and tomato products, chili peppers, onions and garlic, and horseradish. Grains Pastries or quick breads with added fat. Meats and other proteins High-fat meats, such as fatty beef or pork, hot dogs, ribs, ham, sausage, salami, and bacon. Fried meat or protein, including fried fish and fried chicken. Nuts and nut butters, in large amounts. Dairy Whole milk and chocolate milk. Sour cream. Cream. Ice cream. Cream cheese. Milkshakes. Fats and oils Butter. Margarine. Shortening. Ghee. Beverages Coffee and tea, with or without caffeine. Carbonated beverages. Sodas. Energy drinks. Fruit juice made with acidic fruits, such as orange or grapefruit. Tomato juice. Alcoholic drinks. Sweets and desserts Chocolate and cocoa. Donuts. Seasonings and condiments Pepper. Peppermint and spearmint. Added salt. Any condiments, herbs, or seasonings that cause symptoms. For some people, this may include curry, hot sauce, or vinegar-based salad dressings. The items listed above may not be a complete list of foods and beverages to avoid. Contact a dietitian for more information. Questions to ask your health care provider Diet and lifestyle changes are usually the first steps that are taken to manage symptoms of GERD. If diet and lifestyle changes do not improve your symptoms, talk with your health care provider about taking medicines. Where to find more information  International Foundation for  Gastrointestinal Disorders: aboutgerd.org Summary  When you have gastroesophageal reflux disease (GERD), food and lifestyle choices may be very helpful in easing the discomfort of GERD.  Eat frequent, small meals instead of three large meals each day. Eat your meals slowly, in a relaxed setting. Avoid bending over or lying down until 2-3 hours after eating.  Limit high-fat foods such as fatty meats or fried foods. This information is not intended to replace advice given to you by your health care provider. Make sure you discuss any questions you have with your health care provider. Document Revised: 11/01/2019 Document Reviewed: 11/01/2019 Elsevier Patient Education  [place].  Lactose-Free Diet, Adult If you have lactose intolerance, you are not able to digest lactose. Lactose is a natural sugar found mainly in dairy milk and dairy products. You may need to avoid all foods and beverages that contain lactose. A lactose-free diet can help you do this. Which foods have lactose? Lactose is found in dairy milk and dairy products, such as:  Yogurt.  Cheese.  Butter.  Margarine.  Sour cream.  Cream.  Whipped toppings and nondairy creamers.  Ice cream and other dairy-based desserts. Lactose is also found in foods or products made  with dairy milk or milk ingredients. To find out whether a food contains dairy milk or a milk ingredient, look at the ingredients list. Avoid foods with the statement "May contain milk" and foods that contain:  Milk powder.  Whey.  Curd.  Caseinate.  Lactose.  Lactalbumin.  Lactoglobulin. What are alternatives to dairy milk and foods made with milk products?  Lactose-free milk.  Soy milk with added calcium and vitamin D.  Almond milk, coconut milk, rice milk, or other nondairy milk alternatives with added calcium and vitamin D. Note that these are low in protein.  Soy products, such as soy yogurt, soy cheese, soy ice cream, and  soy-based sour cream.  Other nut milk products, such as almond yogurt, almond cheese, cashew yogurt, cashew cheese, cashew ice cream, coconut yogurt, and coconut ice cream. What are tips for following this plan?  Do not consume foods, beverages, vitamins, minerals, or medicines containing lactose. Read ingredient lists carefully.  Look for the words "lactose-free" on labels.  Use lactase enzyme drops or tablets as directed by your health care provider.  Use lactose-free milk or a milk alternative, such as soy milk or almond milk, for drinking and cooking.  Make sure you get enough calcium and vitamin D in your diet. A lactose-free eating plan can be lacking in these important nutrients.  Take calcium and vitamin D supplements as directed by your health care provider. Talk to your health care provider about supplements if you are not able to get enough calcium and vitamin D from food. What foods can I eat? Fruits All fresh, canned, frozen, or dried fruits that are not processed with lactose. Vegetables All fresh, frozen, and canned vegetables without cheese, cream, or butter sauces. Grains Any that are not made with dairy milk or dairy products. Meats and other proteins Any meat, fish, poultry, and other protein sources that are not made with dairy milk or dairy products. Soy cheese and yogurt. Fats and oils Any that are not made with dairy milk or dairy products. Beverages Lactose-free milk. Soy, rice, or almond milk with added calcium and vitamin D. Fruit and vegetable juices. Sweets and desserts Any that are not made with dairy milk or dairy products. Seasonings and condiments Any that are not made with dairy milk or dairy products. Calcium Calcium is found in many foods that contain lactose and is important for bone health. The amount of calcium you need depends on your age:  Adults younger than 50 years: 1,000 mg of calcium a day.  Adults older than 50 years: 1,200 mg of  calcium a day. If you are not getting enough calcium, you may get it from other sources, including:  Orange juice with calcium added. There are 300-350 mg of calcium in 1 cup of orange juice.  Calcium-fortified soy milk. There are 300-400 mg of calcium in 1 cup of calcium-fortified soy milk.  Calcium-fortified rice or almond milk. There are 300 mg of calcium in 1 cup of calcium-fortified rice or almond milk.  Calcium-fortified breakfast cereals. There are 100-1,000 mg of calcium in calcium-fortified breakfast cereals.  Spinach, cooked. There are 145 mg of calcium in  cup of cooked spinach.  Edamame, cooked. There are 130 mg of calcium in  cup of cooked edamame.  Collard greens, cooked. There are 125 mg of calcium in  cup of cooked collard greens.  Kale, frozen or cooked. There are 90 mg of calcium in  cup of cooked or frozen kale.  Almonds. There  are 95 mg of calcium in  cup of almonds.  Broccoli, cooked. There are 60 mg of calcium in 1 cup of cooked broccoli. The items listed above may not be a complete list of recommended foods and beverages. Contact a dietitian for more options.   What foods are not recommended? Fruits None, unless they are made with dairy milk or dairy products. Vegetables None, unless they are made with dairy milk or dairy products. Grains Any grains that are made with dairy milk or dairy products. Meats and other proteins None, unless they are made with dairy milk or dairy products. Dairy All dairy products, including milk, goat's milk, buttermilk, kefir, acidophilus milk, flavored milk, evaporated milk, condensed milk, dulce de [place], eggnog, yogurt, cheese, and cheese spreads. Fats and oils Any that are made with milk or milk products. Margarines and salad dressings that contain milk or cheese. Cream. Half and half. Cream cheese. Sour cream. Chip dips made with sour cream or yogurt. Beverages Hot chocolate. Cocoa with lactose. Instant iced teas.  Powdered fruit drinks. Smoothies made with dairy milk or yogurt. Sweets and desserts Any that are made with milk or milk products. Seasonings and condiments Chewing gum that has lactose. Spice blends if they contain lactose. Artificial sweeteners that contain lactose. Nondairy creamers. The items listed above may not be a complete list of foods and beverages to avoid. Contact a dietitian for more information. Summary  If you are lactose intolerant, it means that you have a hard time digesting lactose, a natural sugar found in milk and milk products.  Following a lactose-free diet can help you manage this condition.  Calcium is important for bone health and is found in many foods that contain lactose. Talk with your health care provider about other sources of calcium. This information is not intended to replace advice given to you by your health care provider. Make sure you discuss any questions you have with your health care provider. Document Revised: 05/20/2017 Document Reviewed: 05/20/2017 Elsevier Patient Education  2021 [employer].

## 2020-10-11 ENCOUNTER — Other Ambulatory Visit: Payer: Self-pay

## 2020-10-11 ENCOUNTER — Other Ambulatory Visit: Payer: Medicare PPO

## 2020-10-11 DIAGNOSIS — C61 Malignant neoplasm of prostate: Secondary | ICD-10-CM

## 2020-10-12 LAB — PSA: Prostate Specific Ag, Serum: 1.1 ng/mL (ref 0.0–4.0)

## 2020-10-18 ENCOUNTER — Other Ambulatory Visit: Payer: Self-pay

## 2020-10-18 ENCOUNTER — Encounter: Payer: Self-pay | Admitting: Urology

## 2020-10-18 ENCOUNTER — Ambulatory Visit (INDEPENDENT_AMBULATORY_CARE_PROVIDER_SITE_OTHER): Payer: Medicare PPO | Admitting: Urology

## 2020-10-18 VITALS — BP 198/78 | HR 51 | Ht 69.0 in | Wt 169.0 lb

## 2020-10-18 DIAGNOSIS — N138 Other obstructive and reflux uropathy: Secondary | ICD-10-CM

## 2020-10-18 DIAGNOSIS — R351 Nocturia: Secondary | ICD-10-CM | POA: Diagnosis not present

## 2020-10-18 DIAGNOSIS — N5201 Erectile dysfunction due to arterial insufficiency: Secondary | ICD-10-CM

## 2020-10-18 DIAGNOSIS — N401 Enlarged prostate with lower urinary tract symptoms: Secondary | ICD-10-CM | POA: Diagnosis not present

## 2020-10-18 DIAGNOSIS — C61 Malignant neoplasm of prostate: Secondary | ICD-10-CM

## 2020-10-18 MED ORDER — SILDENAFIL CITRATE 100 MG PO TABS: 100.0000 mg | ORAL_TABLET | ORAL | 5 refills | Status: DC | PRN

## 2020-10-18 MED ORDER — ALFUZOSIN HCL ER 10 MG PO TB24: 10.0000 mg | ORAL_TABLET | Freq: Every day | ORAL | 11 refills | Status: DC

## 2020-10-18 NOTE — Progress Notes (Signed)
Urological Symptom Review  Patient is experiencing the following symptoms: Frequent urination Hard to postpone urination Get up at night to urinate Leakage of urine Weak stream Erection problems (male only)   Review of Systems  Gastrointestinal (upper)  : Indigestion/heartburn  Gastrointestinal (lower) : Diarrhea Constipation  Constitutional : Fatigue  Skin: Skin rash/lesion Itching  Eyes: Negative for eye symptoms  Ear/Nose/Throat : Sinus problems  Hematologic/Lymphatic: Negative for Hematologic/Lymphatic symptoms  Cardiovascular : Leg swelling  Respiratory : Negative for respiratory symptoms  Endocrine: Negative for endocrine symptoms  Musculoskeletal: Joint pain  Neurological: Negative for neurological symptoms  Psychologic: Depression Anxiety

## 2020-10-18 NOTE — Patient Instructions (Signed)
Prostate Cancer  The prostate is a male gland that helps make semen. It is located below a man's bladder, in front of the rectum. Prostate cancer is when abnormal cells grow inthis gland. What are the causes? The cause of this condition is not known. What increases the risk? You are more likely to develop this condition if: You are 77 years of age or older. You are African American. You have a family history of prostate cancer. You have a family history of breast cancer. What are the signs or symptoms? Symptoms of this condition include: A need to pee often. Peeing that is weak, or pee that stops and starts. Trouble starting or stopping your pee. Inability to pee. Blood in your pee or semen. Pain in the lower back, lower belly (abdomen), hips, or upper thighs. Trouble getting an erection. Trouble emptying all of your pee. How is this treated? Treatment for this condition depends on your age, your health, the kind of treatment you like, and how far the cancer has spread. Treatments include: Being watched. This is called observation. You will be tested from time to time, but you will not get treated. Tests are to make sure that the cancer is not growing. Surgery. This may be done to remove the prostate, to remove the testicles, or to freeze or kill cancer cells. Radiation. This uses a strong beam to kill cancer cells. Ultrasound energy. This uses strong sound waves to kill cancer cells. Chemotherapy. This uses medicines that stop cancer cells from increasing. This kills cancer cells and healthy cells. Targeted therapy. This kills cancer cells only. Healthy cells are not affected. Hormone treatment. This stops the body from making hormones that help the cancer cells to grow. Follow these instructions at home: Take over-the-counter and prescription medicines only as told by your doctor. Eat a healthy diet. Get plenty of sleep. Ask your doctor for help to find a support group for men  with prostate cancer. If you have to go to the hospital, let your cancer doctor (oncologist) know. Treatment may affect your ability to have sex. Touch, hold, hug, and caress your partner to have intimate moments. Keep all follow-up visits as told by your doctor. This is important. Contact a doctor if: You have new or more trouble peeing. You have new or more blood in your pee. You have new or more pain in your hips, back, or chest. Get help right away if: You have weakness in your legs. You lose feeling in your legs. You cannot control your pee or your poop (stool). You have chills or a fever. Summary The prostate is a male gland that helps make semen. Prostate cancer is when abnormal cells grow in this gland. Treatment includes doing surgery, using medicines, using very strong beams, or watching without treatment. Ask your doctor for help to find a support group for men with prostate cancer. Contact a doctor if you have problems peeing or have any new pain that you did not have before. This information is not intended to replace advice given to you by your health care provider. Make sure you discuss any questions you have with your healthcare provider. Document Revised: 05/25/2020 Document Reviewed: 04/06/2019 Elsevier Patient Education  2022 [employer] Inc.  

## 2020-10-18 NOTE — Addendum Note (Signed)
Addended by: [name] on: 10/18/2020 10:23 AM   Modules accepted: Orders

## 2020-10-18 NOTE — Progress Notes (Signed)
10/18/2020 10:11 AM   Donald Klein 25-Jul-1945 [id number]  Referring provider: [name], MD [facility] [place],  [place] [address]  Followup prostate cancer and BPH   HPI: [name] is a 75yo here for followup for BPH and Prostatwe Cancer. PSA 1.1.  IPSS 15 QOL 4. He remains on uroxatral 10mg . Urine stream strong. His most bothersome LUTS are nicturia 3x and urgency with urge incontinence. He uses sildenafil prn with good results   PMH: Past Medical History:  Diagnosis Date   Anemia    Anxiety    Barrett esophagus    Last EGD 05/27/08 Barrett's without dyplasia. EGD due 05/2011   Chronic back pain    Colitis    In 1980s, dx with UC by Dr. Humphrey Klein at time of TCS, 1992 TCS, chronic colitis   Depression    DM type 2 (diabetes mellitus, type 2) ([place])    Fatty liver     small spots on liver also   Fibromyalgia    GERD (gastroesophageal reflux disease)    GIST, malignant (HCC)    Recurrent, on Gleevec, followed by [facility]   Helicobacter pylori gastritis 2009   treated   History of hiatal hernia    HTN (hypertension)    Hydrocele    Hyperlipidemia    IBS (irritable bowel syndrome)    Malignant stromal tumor of stomach (HCC)    Perceptive hearing loss, both sides    weras hearing aides both ears   Prostate cancer ([place])    PTSD (post-traumatic stress disorder)    PTSD (post-traumatic stress disorder)    Pulmonary nodules chest ct 08-25-2019   stable per chest ct   Skin abnormality 05/31/2020   sore on right shin x 1 week no drainage healing wife putting neosporin on daily   Sleep apnea    no cpap did not tolerate cpap    Small bowel tumor    presented with SBO (distal-near TI), path showed spindle cell neoplasm 6.5cm   Tubular adenoma of colon 10/08/10   Last colonosocpy 1 cecal TA   Wears glasses     Surgical History: Past Surgical History:  Procedure Laterality Date   APPENDECTOMY  July 2014   at time of small bowel resection   BIOPSY  01/06/2018    Procedure: BIOPSY;  Surgeon: Donald Dolin, MD;  [facility];  Service: Endoscopy;;  gastric biopsy , esophageal biopsy   BIOPSY  10/29/2018   Procedure: BIOPSY;  Surgeon: Donald Dolin, MD;  [facility];  Service: Endoscopy;;  gastric, esophageal   CARDIAC CATHETERIZATION  2001   saw cardiology for 1 year and released   CHOLECYSTECTOMY  1980's or 1990's   COLONOSCOPY  10/08/2010   Normal rectum/tubular adenoma/normal random biopsy. Next TCS due 10/2015.   COLONOSCOPY N/A 09/30/2014   Dr. Gala Klein: Redunant colon . Status post segmental biopsy and stool sampling. Negative stool samples and colonic biopsies.    COLONOSCOPY WITH PROPOFOL N/A 10/29/2018   Surgeon: Donald Dolin, MD; markedly redundant colon with benign-appearing noncritical colonic stricture, inability to identify landmarks of cecum and ileocecal valve.  Apparently patient had virtual colonoscopy thereafter.  Dr. Gala Klein did not recommend repeating colonoscopy.   ESOPHAGOGASTRODUODENOSCOPY  07/31/2011   Dr. Doyce Klein segment Barrett's esophagus s/p bx Hiatal hernia. Duodenal diverticulum. No dysplasia.Next EGD 07/2014.   ESOPHAGOGASTRODUODENOSCOPY N/A 09/30/2014   Dr. Gala Klein: Abnormal distal esophagus consistant with prior diagnosis of short segment Barrett's esophagus status post biopsy.  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 Hiatal hernia. Abnormal Gastric mucosa of uncertain significance status post gastric biopsy. path with +Barrett's esophagus but no dysplasia, mild chronic gastritis. Surveillance for Barrett's due in 2019   ESOPHAGOGASTRODUODENOSCOPY N/A 01/06/2018   Procedure: ESOPHAGOGASTRODUODENOSCOPY (EGD);  Surgeon: Donald Dolin, MD;  [facility];  Service: Endoscopy;  Laterality: N/A;  12:00pm   ESOPHAGOGASTRODUODENOSCOPY (EGD) WITH PROPOFOL N/A 10/29/2018    Surgeon: Donald Dolin, MD;  appearance of Barrett's esophagus though biopsy with reactive/regenerative changes, moderate  gastric mucosa with some deformity likely related to prior surgery s/p biopsy (chronic gastritis), normal examined duodenum.   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)   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 on 07/2015    PROSTATE BIOPSY  2021   RADIOACTIVE SEED IMPLANT N/A 06/02/2020   Procedure: RADIOACTIVE SEED IMPLANT/BRACHYTHERAPY IMPLANT;  Surgeon: Donald Gustin, MD;  Location: [facility];  Service: Urology;  Laterality: N/A;   SPACE OAR INSTILLATION N/A 06/02/2020   Procedure: SPACE OAR INSTILLATION;  Surgeon: Donald Gustin, MD;  Location: [facility];  Service: Urology;  Laterality: N/A;    Home Medications:  Allergies as of 10/18/2020       Reactions   Lisinopril Other (See Comments)   Pt unsure    Metformin Diarrhea   Enoxaparin Rash   Other reaction(s): Eruption of skin   Lovenox [enoxaparin Sodium] Hives, Rash   Penicillins Rash    Has patient had a PCN reaction causing immediate rash, facial/tongue/throat swelling, SOB or lightheadedness with hypotension: Yes Has patient had a PCN reaction causing severe rash involving mucus membranes or skin necrosis: No Has patient had a PCN reaction that required hospitalization No Has patient had a PCN reaction occurring within the last 10 years: No If all of the above answers are "NO", then may proceed with Cephalosporin use.        Medication List        Accurate as of October 18, 2020 10:11 AM. If you have any questions, ask your nurse or doctor.          alfuzosin 10 MG 24 hr tablet Commonly known as: UROXATRAL Take 1 tablet (10 mg total)  by mouth daily with breakfast.   aspirin 81 MG EC tablet   Cholecalciferol 25 MCG (1000 UT) tablet Take by mouth daily.   diazepam 5 MG tablet Commonly known as: VALIUM Take 5 mg by mouth daily as needed for anxiety.   DRY EYES OP Place 1 drop into both eyes 2 (two) times daily as needed (dry eyes).   esomeprazole 20 MG capsule Commonly known as: NexIUM Take 1 capsule (20 mg total) by mouth 2 (two) times daily before a meal.   ferrous sulfate 325 (65 FE) MG tablet Take 325 mg by mouth. Monday Wednesday Friday   Fish Oil 1000 MG Caps Take by mouth in the morning and at bedtime.   gabapentin 400 MG capsule Commonly known as: NEURONTIN Take 400 mg by mouth at bedtime.   hydrocortisone 2.5 % rectal cream Commonly known as: ANUSOL-HC Place 1 application rectally 2 (two) times daily. For up to 10 days  at a time   imatinib 400 MG tablet Commonly known as: GLEEVEC Take 1 tablet by mouth at bedtime.   Januvia 100 MG tablet Generic drug: sitaGLIPtin Take 100 mg by mouth daily.   latanoprost 0.005 % ophthalmic solution Commonly known as: XALATAN SMARTSIG:In Eye(s)   loperamide 2 MG capsule Commonly known as: IMODIUM Take by mouth as needed for diarrhea or loose stools.   losartan 100 MG tablet Commonly known as: COZAAR Take 100 mg by mouth daily.   Lovaza 1 g capsule Generic drug: omega-3 acid ethyl esters Take 1 capsule by mouth 2 (two) times daily.   Magnesium 400 MG Caps Take by mouth daily.   metoprolol tartrate 50 MG tablet Commonly known as: LOPRESSOR Take 25 mg by mouth 2 (two) times daily.   multivitamin with minerals tablet Take 1 tablet by mouth daily. Centrum adult 50 plus   neomycin-bacitracin-polymyxin ointment Commonly known as: NEOSPORIN Apply 1 application topically every 12 (twelve) hours. To right shin daily   nitroGLYCERIN 0.4 MG SL tablet Commonly known as: NITROSTAT Place 0.4 mg under the tongue every 5 (five) minutes as needed for  chest pain.   NON FORMULARY Eye drop lantaprost 125 mg 1 drop both eyes at hs   QUEtiapine 50 MG tablet Commonly known as: SEROQUEL Take 50 mg by mouth at bedtime as needed for depression. Ordered to take 1 and 1/2 tabs prn qhs Taking 25 mg prn qhs prn   rosuvastatin 10 MG tablet Commonly known as: CRESTOR Take 10 mg by mouth daily.   sertraline 100 MG tablet Commonly known as: ZOLOFT Take 100 mg by mouth daily as needed (PTSD).   sildenafil 100 MG tablet Commonly known as: VIAGRA Take by mouth.   traMADol 50 MG tablet Commonly known as: Ultram Take 1 tablet (50 mg total) by mouth every 6 (six) hours as needed.   vitamin C 250 MG tablet Commonly known as: ASCORBIC ACID Take 250 mg by mouth daily.        Allergies:  Allergies  Allergen Reactions   Lisinopril Other (See Comments)    Pt unsure    Metformin Diarrhea   Enoxaparin Rash    Other reaction(s): Eruption of skin   Lovenox [Enoxaparin Sodium] Hives and Rash   Penicillins Rash     Has patient had a PCN reaction causing immediate rash, facial/tongue/throat swelling, SOB or lightheadedness with hypotension: Yes Has patient had a PCN reaction causing severe rash involving mucus membranes or skin necrosis: No Has patient had a PCN reaction that required hospitalization No Has patient had a PCN reaction occurring within the last 10 years: No If all of the above answers are "NO", then may proceed with Cephalosporin use.     Family History: Family History  Problem Relation Age of Onset   Heart disease Mother    Suicidality Mother    Heart attack Father        46   Heart disease Father    Heart disease Sister    Prostate cancer Brother    Colon cancer Neg Hx    Breast cancer Neg Hx    Pancreatic cancer Neg Hx     Social History:  reports that he has never smoked. He has never used smokeless tobacco. He reports that he does not drink alcohol and does not use drugs.  ROS: All other review of systems  were reviewed and are negative except what is noted above in HPI  Physical Exam: BP (!) 198/78  Pulse (!) 51   Ht 5\' 9"  (1.753 m)   Wt 169 lb (76.7 kg)   BMI 24.96 kg/m   Constitutional:  Alert and oriented, No acute distress. HEENT: [place] AT, moist mucus membranes.  Trachea midline, no masses. Cardiovascular: No clubbing, cyanosis, or edema. Respiratory: Normal respiratory effort, no increased work of breathing. GI: Abdomen is soft, nontender, nondistended, no abdominal masses GU: No CVA tenderness.  Lymph: No cervical or inguinal lymphadenopathy. Skin: No rashes, bruises or suspicious lesions. Neurologic: Grossly intact, no focal deficits, moving all 4 extremities. Psychiatric: Normal mood and affect.  Laboratory Data: Lab Results  Component Value Date   WBC 4.4 05/30/2020   HGB 12.5 (L) 05/30/2020   HCT 36.6 (L) 05/30/2020   MCV 97.1 05/30/2020   PLT 170 05/30/2020    Lab Results  Component Value Date   CREATININE 1.29 (H) 05/30/2020    Lab Results  Component Value Date   PSA 12.0 (H) 11/22/2019    No results found for: TESTOSTERONE  No results found for: HGBA1C  Urinalysis    Component Value Date/Time   COLORURINE YELLOW 07/12/2018 1158   APPEARANCEUR Clear 07/19/2020 1023   LABSPEC 1.012 07/12/2018 1158   PHURINE 5.0 07/12/2018 1158   GLUCOSEU Negative 07/19/2020 1023   HGBUR NEGATIVE 07/12/2018 1158   BILIRUBINUR Negative 07/19/2020 1023   KETONESUR NEGATIVE 07/12/2018 1158   PROTEINUR 1+ (A) 07/19/2020 1023   PROTEINUR NEGATIVE 07/12/2018 1158   UROBILINOGEN 0.2 03/29/2010 0803   NITRITE Negative 07/19/2020 1023   NITRITE NEGATIVE 07/12/2018 1158   LEUKOCYTESUR Negative 07/19/2020 1023   LEUKOCYTESUR NEGATIVE 07/12/2018 1158    Lab Results  Component Value Date   LABMICR See below: 07/19/2020   WBCUA None seen 07/19/2020   LABEPIT 0-10 07/19/2020   MUCUS Present 07/19/2020   BACTERIA Few 07/19/2020    Pertinent Imaging:  No results found  for this or any previous visit.  No results found for this or any previous visit.  No results found for this or any previous visit.  No results found for this or any previous visit.  No results found for this or any previous visit.  No results found for this or any previous visit.  No results found for this or any previous visit.  No results found for this or any previous visit.   Assessment & Plan:    1. Prostate cancer ([place]) -RTC 6 months with PSA - Urinalysis, Routine w reflex microscopic  2. BPH with LUTS, nocturia -Continue uroxatral 10mg   3. Erectile dysfunction -sildenafil prn   No follow-ups on file.  Nicolette Bang, MD  [facility] [place]

## 2020-10-19 LAB — MICROSCOPIC EXAMINATION: Bacteria, UA: NONE SEEN

## 2020-10-19 LAB — URINALYSIS, ROUTINE W REFLEX MICROSCOPIC
Bilirubin, UA: NEGATIVE
Glucose, UA: NEGATIVE
Leukocytes,UA: NEGATIVE
Nitrite, UA: NEGATIVE
RBC, UA: NEGATIVE
Specific Gravity, UA: 1.03 (ref 1.005–1.030)
Urobilinogen, Ur: 0.2 mg/dL (ref 0.2–1.0)
pH, UA: 5.5 (ref 5.0–7.5)

## 2020-10-31 NOTE — Progress Notes (Signed)
Results mailed 

## 2020-12-09 NOTE — Progress Notes (Signed)
Referring Provider: [name], MD Primary Care Physician:  [name], MD Primary GI Physician: Dr. Gala Romney  Chief Complaint  Patient presents with   Diarrhea   Gastroesophageal Reflux    Ok as long as he takes med    HPI:   Donald Klein is a 75 y.o. male presenting today for follow-up of GERD and chronic diarrhea.   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 Also with history of GERD, Barrett's esophagus, chronic diarrhea and colonic adenomas.  Last EGD and colonoscopy in June 2020.  Colonoscopy with markedly redundant colon with benign-appearing noncritical colonic stricture, inability to identify landmarks of cecum and ileocecal valve.  EGD with appearance of Barrett's esophagus though biopsy with reactive/regenerative changes, moderate gastric mucosa with some deformity likely related to prior surgery s/p biopsy (chronic gastritis), normal examined duodenum.  Virtual colonoscopy August 2020 was normal.  No recommendations to repeat TCS or EGD.   Last seen in our office April 2022.  Reported epigastric abdominal pain after running out of omeprazole for several weeks and eating a lot of tomatoes, ketchup, and onions.  Recently resumed 20 mg once daily rather than twice daily as twice daily worsened chronic diarrhea.  Upper abdominal pain improved with resuming omeprazole.  GERD doing well with omeprazole daily.  Chronic diarrhea dating back to the 39s with 6-7 watery bowel movements daily.  Uses 1 Imodium if going out for traveling as this reduces BMs to once daily.  Denied BRBPR, melena, or any other significant symptoms.  He was scheduled for [facility] with [facility] on 4/12.  Plan to stop omeprazole and try Nexium, hold off on EGD unless symptoms do not  improve.  Suspected chronic diarrhea likely multifactorial with bile salt diarrhea, dietary intolerances, and med effect in the setting of omeprazole, magnesium, Gleevec, and Januvia.  Plan to focus on dietary adjustments, changing PPI.  Consider updating stool studies, TSH, and screening for celiac disease if watery diarrhea persists. Requested progress report in 4 weeks.  No progress report received.  CT chest abdomen and pelvis completed 08/15/2020 with minimally decreased size of exophytic soft tissue mass along greater curvature of stomach and adjacent mesocolon. Slightly increased prominence of a few scattered mesenteric nodes within thegastrohepatic ligament. Stable size of the hypodense 9 mm lesion within the uncinate process of pancreas.  Today: States he is doing well overall.   GERD: Doing ok with Nexium BID. Will take OTC antiacid if he eats late at night which works well for breakthrough symptoms. No nausea or vomiting.   Chronic Diarrhea: 7-8 Bms daily or more. Nocturnal stools. If taking imodium, diarrhea improves. He will take 2 in the morning if going somewhere. Metamucil worsened diarrhea.  No brbpr. Dark stools only when taking iron M, W, and F. Drinking almond milk 2 days a week. Eating cheese some time during the week. Cramping prior to BMs intermittently that resolves thereafter.  Just mild, nothing significant.  States he is used to this.   Also get some abdominal pain if taking medications on an empty stomach.  Otherwise, no significant abdominal pain.  Patient states oncology is not recommending surgery for GIST. Monitoring on Rives.   Past Medical History:  Diagnosis Date   Anemia    Anxiety    Barrett esophagus  Last EGD 05/27/08 Barrett's without dyplasia. EGD due 05/2011   Chronic back pain    Colitis    In 1980s, dx with UC by Dr. Humphrey Rolls at time of TCS, 1992 TCS, chronic colitis   Depression    DM type 2 (diabetes mellitus, type 2) ([place])    Fatty liver      small spots on liver also   Fibromyalgia    GERD (gastroesophageal reflux disease)    GIST, malignant (HCC)    Recurrent, on Gleevec, [facility] by [facility]   Helicobacter pylori gastritis 2009   treated   History of hiatal hernia    HTN (hypertension)    Hydrocele    Hyperlipidemia    IBS (irritable bowel syndrome)    Malignant stromal tumor of stomach (HCC)    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   Perceptive hearing loss, both sides    weras hearing aides both ears   Prostate cancer ([place])    PTSD (post-traumatic stress disorder)    PTSD (post-traumatic stress disorder)    Pulmonary nodules chest ct 08-25-2019   stable per chest ct   Skin abnormality 05/31/2020   sore on right shin x 1 week no drainage healing wife putting neosporin on daily   Sleep apnea    no cpap did not tolerate cpap    Small bowel tumor    presented with SBO (distal-near TI), path showed spindle cell neoplasm 6.5cm   Tubular adenoma of colon 10/08/2010   Last colonosocpy 1 cecal TA   Wears glasses     Past Surgical History:  Procedure Laterality Date   APPENDECTOMY  July 2014   at time of small bowel resection   BIOPSY  01/06/2018   Procedure: BIOPSY;  Surgeon: Daneil Dolin, MD;  [facility];  Service: Endoscopy;;  gastric biopsy , esophageal biopsy   BIOPSY  10/29/2018   Procedure: BIOPSY;  Surgeon: Daneil Dolin, MD;  [facility];  Service: Endoscopy;;  gastric, esophageal   CARDIAC CATHETERIZATION  2001   saw cardiology for 1 year and released   CHOLECYSTECTOMY  1980's or 1990's   COLONOSCOPY  10/08/2010   Normal rectum/tubular adenoma/normal random biopsy. Next TCS due 10/2015.   COLONOSCOPY N/A 09/30/2014   Dr. Gala Romney: Redunant colon . Status post segmental biopsy and stool sampling. Negative stool samples and colonic biopsies.    COLONOSCOPY WITH PROPOFOL N/A 10/29/2018   Surgeon: Daneil Dolin, MD; markedly redundant colon with benign-appearing noncritical colonic stricture,  inability to identify landmarks of cecum and ileocecal valve.  Apparently patient had virtual colonoscopy thereafter.  Dr. Gala Romney did not recommend repeating colonoscopy.   ESOPHAGOGASTRODUODENOSCOPY  07/31/2011   Dr. Doyce Para segment Barrett's esophagus s/p bx Hiatal hernia. Duodenal diverticulum. No dysplasia.Next EGD 07/2014.   ESOPHAGOGASTRODUODENOSCOPY N/A 09/30/2014   Dr. Gala Romney: Abnormal distal esophagus consistant with prior diagnosis of short segment Barrett's esophagus status post biopsy. 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 Hiatal hernia. Abnormal Gastric mucosa of uncertain significance status post gastric biopsy. path with +Barrett's esophagus but no dysplasia, mild chronic gastritis. Surveillance for Barrett's due in 2019   ESOPHAGOGASTRODUODENOSCOPY N/A 01/06/2018   Procedure: ESOPHAGOGASTRODUODENOSCOPY (EGD);  Surgeon: Daneil Dolin, MD;  [facility];  Service: Endoscopy;  Laterality: N/A;  12:00pm   ESOPHAGOGASTRODUODENOSCOPY (EGD) WITH PROPOFOL N/A 10/29/2018    Surgeon: Daneil Dolin, MD;  appearance of Barrett's esophagus though biopsy with reactive/regenerative changes, moderate gastric mucosa with some deformity likely related to  prior surgery s/p biopsy (chronic gastritis), normal examined duodenum.   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)   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 on 07/2015    PROSTATE BIOPSY  2021   RADIOACTIVE SEED IMPLANT N/A 06/02/2020   Procedure: RADIOACTIVE SEED IMPLANT/BRACHYTHERAPY IMPLANT;  Surgeon: [name], MD;  Location[facility];  Service: Urology;   Laterality: N/A;   SPACE OAR INSTILLATION N/A 06/02/2020   Procedure: SPACE OAR INSTILLATION;  Surgeon: [name], MD;  Location: [facility];  Service: Urology;  Laterality: N/A;    Current Outpatient Medications  Medication Sig Dispense Refill   alfuzosin (UROXATRAL) 10 MG 24 hr tablet Take 1 tablet (10 mg total) by mouth daily with breakfast. 30 tablet 11   Artificial Tear Ointment (DRY EYES OP) Place 1 drop into both eyes 2 (two) times daily as needed (dry eyes).      CALCIUM PO Take 1 tablet by mouth 2 (two) times daily.     Cholecalciferol 25 MCG (1000 UT) tablet Take by mouth daily.     diazepam (VALIUM) 5 MG tablet Take 5 mg by mouth daily as needed for anxiety.     esomeprazole (NEXIUM) 20 MG capsule Take 1 capsule (20 mg total) by mouth 2 (two) times daily before a meal. 60 capsule 5   ferrous sulfate 325 (65 FE) MG tablet Take 325 mg by mouth. Monday Wednesday Friday     gabapentin (NEURONTIN) 400 MG capsule Take 400 mg by mouth at bedtime.     GARLIC PO Take by mouth daily.     hydrocortisone (ANUSOL-HC) 2.5 % rectal cream Place 1 application rectally 2 (two) times daily. For up to 10 days at a time 30 g 2   imatinib (GLEEVEC) 400 MG tablet Take 1 tablet by mouth at bedtime.     JANUVIA 100 MG tablet Take 100 mg by mouth daily.     latanoprost (XALATAN) 0.005 % ophthalmic solution SMARTSIG:In Eye(s)     loperamide (IMODIUM) 2 MG capsule Take by mouth as needed for diarrhea or loose stools.     losartan (COZAAR) 100 MG tablet Take 100 mg by mouth daily.     LOVAZA 1 G capsule Take 1 capsule by mouth 2 (two) times daily.      Magnesium 400 MG CAPS Take by mouth daily.     metoprolol (LOPRESSOR) 50 MG tablet Take 50-75 mg by mouth 2 (two) times daily. '50mg'$  in am and '75mg'$  at night     Multiple Vitamins-Minerals (MULTIVITAMIN WITH MINERALS) tablet Take 1 tablet by mouth daily. Centrum adult 50 plus     neomycin-bacitracin-polymyxin (NEOSPORIN) ointment Apply 1  application topically every 12 (twelve) hours. To right shin daily     nitroGLYCERIN (NITROSTAT) 0.4 MG SL tablet Place 0.4 mg under the tongue every 5 (five) minutes as needed for chest pain.     NON FORMULARY Eye drop lantaprost 125 mg 1 drop both eyes at hs     Omega-3 Fatty Acids (FISH OIL) 1000 MG CAPS Take by mouth in the morning and at bedtime.     QUEtiapine (SEROQUEL) 50 MG tablet Take  25 mg by mouth at bedtime as needed for depression. Ordered to take 1 and 1/2 tabs prn qhs Taking 25 mg prn qhs prn     rosuvastatin (CRESTOR) 10 MG tablet Take 10 mg by mouth daily.      sertraline (ZOLOFT) 100 MG tablet Take 100 mg by mouth daily as needed (PTSD).     sildenafil (VIAGRA) 100 MG tablet Take 1 tablet (100 mg total) by mouth as needed for erectile dysfunction. 10 tablet 5   traMADol (ULTRAM) 50 MG tablet Take 1 tablet (50 mg total) by mouth every 6 (six) hours as needed. 30 tablet 0   vitamin C (ASCORBIC ACID) 250 MG tablet Take 250 mg by mouth daily.     aspirin 81 MG EC tablet  (Patient not taking: Reported on 12/11/2020)     No current facility-administered medications for this visit.    Allergies as of 12/11/2020 - Review Complete 12/11/2020  Allergen Reaction Noted   Lisinopril Other (See Comments) 12/29/2017   Metformin Diarrhea 03/05/2019   Enoxaparin Rash 01/01/2013   Lovenox [enoxaparin sodium] Hives and Rash 09/30/2014   Penicillins Rash     Family History  Problem Relation Age of Onset   Heart disease Mother    Suicidality Mother    Heart attack Father        28   Heart disease Father    Heart disease Sister    Prostate cancer Brother    Colon cancer Neg Hx    Breast cancer Neg Hx    Pancreatic cancer Neg Hx     Social History   Socioeconomic History   Marital status: Married    Spouse name: Not on file   Number of children: 1   Years of education: Not on file   Highest education level: Not on file  Occupational History   Occupation: disability  Tobacco  Use   Smoking status: Never   Smokeless tobacco: Never  Vaping Use   Vaping Use: Never used  Substance and Sexual Activity   Alcohol use: No    Alcohol/week: 0.0 standard drinks   Drug use: No   Sexual activity: Yes    Partners: Female    Birth control/protection: None    Comment: spouse  Other Topics Concern   Not on file  Social History Narrative   Not on file   Social Determinants of Health   Financial Resource Strain: Not on file  Food Insecurity: Not on file  Transportation Needs: Not on file  Physical Activity: Not on file  Stress: Not on file  Social Connections: Not on file    Review of Systems: Gen: Denies fever, chills, cold or flulike symptoms, presyncope, syncope. CV: Denies chest pain, palpitations Resp: Denies dyspnea at rest, cough GI: See HPI Heme: See HPI  Physical Exam: BP (!) 157/71   Pulse 61   Temp 97.7 F (36.5 C) (Temporal)   Ht '5\' 9"'$  (1.753 m)   Wt 173 lb 6.4 oz (78.7 kg)   BMI 25.61 kg/m  General:   Alert and oriented. No distress noted. Pleasant and cooperative.  Head:  Normocephalic and atraumatic. Eyes:  Conjuctiva clear without scleral icterus. Heart:  S1, S2 present without murmurs appreciated. Lungs:  Clear to auscultation bilaterally. No wheezes, rales, or rhonchi. No distress.  Abdomen:  +BS, soft, non-tender and non-distended. No rebound or guarding. No HSM or masses noted. Msk:  Symmetrical without gross deformities. Normal posture. Extremities:  Without edema. Neurologic:  Alert and  oriented x4 Psych:  Normal mood and affect.    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 Also with history of GERD, Barrett's esophagus, chronic diarrhea and colonic adenomas.  Last  EGD and colonoscopy in June 2020.  Colonoscopy with markedly redundant colon with benign-appearing noncritical colonic stricture, inability to identify landmarks of cecum and ileocecal valve.  EGD with appearance of Barrett's esophagus though biopsy with reactive/regenerative changes, moderate gastric mucosa with some deformity likely related to prior surgery s/p biopsy (chronic gastritis), normal examined duodenum.  Virtual colonoscopy August 2020 was normal.  No recommendations to repeat TCS or EGD. He is presenting today for follow-up of GERD and chronic diarrhea.   GERD: Fairly well controlled on Nexium 20 mg twice daily. Breakthrough if eating late at night.  No alarm symptoms.  Chronic diarrhea: Patient reports chronic watery diarrhea dating back to the 44s. Suspect this is likely influenced by bile salt diarrhea s/p cholecystectomy, dietary intolerances, and med effect with magnesium, Gleevec, and Januvia. Clinically, diarrhea seems to be a little more severe than at his last visit in April.  Currently with 7-8+ BMs daily as well as nocturnal stools.  Continues to respond well to 2 Imodium PRN.  He switched to almond milk, but continues to eat cheese.  Denies BRBPR or melena.  Colonoscopy is up-to-date in 2020. As symptoms seem to be somewhat worse, we will update stool studies including C diff and GI pathogen panel, check TSH, and screen for celiac disease.  Pancreatic lesion: History of cyst in the pancreatic head, likely representing IPMN.  Previously, cyst measured 5 mm on MRI in January 2021 (Care Everywhere).  Most recent CT April 2022 [facility] Everywhere) with pancreatic lesion measuring 9 mm.   Clinically, patient is asymptomatic.  No significant postprandial abdominal pain, nausea, vomiting, unintentional weight loss, or jaundice.  No family history of pancreatic cancer. With increasing size per CT, query whether we need to repeat MRI for better characterization and conformation of size comparing  MRI to MRI versus EUS +/- FNA. Will discuss with Dr. Gala Romney.     Plan:  C diff GDH, toxin A/B, GI pathogen panel, TSH, IgA, TTG IgA Continue using Imodium as needed for diarrhea.  No more than 4/day. Low-fat diet. Reinforced lactose-free diet. Continue Nexium 20 mg twice daily. Do not eat within 3 hours of going to bed. Prop head of bed up on wood or bricks to create 6 inch incline. I will discuss management of pancreatic lesion with Dr. Gala Romney. Follow-up in 3-4 months.    Aliene Altes, PA-C [facility] Gastroenter[facility] 12/11/2020

## 2020-12-11 ENCOUNTER — Ambulatory Visit (INDEPENDENT_AMBULATORY_CARE_PROVIDER_SITE_OTHER): Payer: Medicare PPO | Admitting: Gastroenterology

## 2020-12-11 ENCOUNTER — Encounter: Payer: Self-pay | Admitting: Gastroenterology

## 2020-12-11 ENCOUNTER — Other Ambulatory Visit: Payer: Self-pay

## 2020-12-11 VITALS — BP 157/71 | HR 61 | Temp 97.7°F | Ht 69.0 in | Wt 173.4 lb

## 2020-12-11 DIAGNOSIS — K869 Disease of pancreas, unspecified: Secondary | ICD-10-CM

## 2020-12-11 DIAGNOSIS — K219 Gastro-esophageal reflux disease without esophagitis: Secondary | ICD-10-CM

## 2020-12-11 DIAGNOSIS — R197 Diarrhea, unspecified: Secondary | ICD-10-CM

## 2020-12-11 DIAGNOSIS — K529 Noninfective gastroenteritis and colitis, unspecified: Secondary | ICD-10-CM | POA: Diagnosis not present

## 2020-12-11 NOTE — Patient Instructions (Addendum)
Please have stool studies and blood work completed at [facility] taking Imodium as needed for diarrhea.  Take 2 Imodium each morning then 1 Imodium after each loose stool thereafter up to 4 Imodium per day.  Continue Nexium 20 mg twice daily 30 minutes before breakfast and dinner.  Do not eat within 3 hours of going to bed.  Prop head of bed up on water breaks to create a 6 inch incline.  Avoid fried, fatty, greasy, spicy, citrus foods.  Follow-up in 3-4 months.   Aliene Altes, PA-C [facility] G[facility]enter[facility]

## 2020-12-13 ENCOUNTER — Encounter: Payer: Self-pay | Admitting: Gastroenterology

## 2020-12-17 ENCOUNTER — Telehealth: Payer: Self-pay | Admitting: Gastroenterology

## 2020-12-17 NOTE — Telephone Encounter (Signed)
Error

## 2020-12-21 ENCOUNTER — Telehealth: Payer: Self-pay | Admitting: Gastroenterology

## 2020-12-21 DIAGNOSIS — K862 Cyst of pancreas: Secondary | ICD-10-CM

## 2020-12-21 DIAGNOSIS — K869 Disease of pancreas, unspecified: Secondary | ICD-10-CM

## 2020-12-21 NOTE — Telephone Encounter (Signed)
[facility] Clinical Pool:  Please let patient know I have discussed his pancreatic cyst with Dr. Gala Romney who agreed that further evaluation was warranted.  Recommended discussing with GI providers in [place] who specialize in tests that can evaluate these lesions further.  Dr. Ardis Hughs, one of the gastroenterologist in [place], reviewed his chart.  Stated there were no concerning signs on his most recent CT that would warrant sampling of this lesion.  He has recommended an MRI to evaluate further.  If patient is agreeable, please arrange MRI/MRCP with pancreatic protocol.  Dx: Pancreatic lesion.

## 2020-12-21 NOTE — Telephone Encounter (Signed)
Tried to call pt, LMOVM for return call. 

## 2020-12-25 LAB — TSH: TSH: 1.76 u[IU]/mL (ref 0.450–4.500)

## 2020-12-25 LAB — TISSUE TRANSGLUTAMINASE, IGA: Transglutaminase IgA: <2 U/mL (ref 0–3)

## 2020-12-25 LAB — IGA: IgA/Immunoglobulin A, Serum: 80 mg/dL (ref 61–437)

## 2020-12-25 NOTE — Telephone Encounter (Signed)
Tried to call pt, LMOVM for return call. 

## 2020-12-25 NOTE — Telephone Encounter (Signed)
Pt called office, informed him of recommendation. He's agreeable to MRI/MRCP with pancreatic protocol.

## 2020-12-25 NOTE — Telephone Encounter (Signed)
PA for MRI/MRCP submitted via [employer] website. Case went to clinical review. Procedure tracking# [id number].

## 2020-12-25 NOTE — Telephone Encounter (Signed)
MRI approved. [employer]# [id number], valid 01/03/21-02/02/21.

## 2020-12-25 NOTE — Addendum Note (Signed)
Addended by: Hassan Rowan on: 12/25/2020 02:10 PM   Modules accepted: Orders

## 2020-12-25 NOTE — Telephone Encounter (Signed)
MRI/MRCP scheduled for 01/03/21 at 3:00pm, arrive at 2:30pm. NPO 4 hours prior to test.  Called and informed pt of MRI/MRCP appt. Appt letter mailed.

## 2020-12-26 ENCOUNTER — Other Ambulatory Visit: Payer: Self-pay | Admitting: Gastroenterology

## 2020-12-28 LAB — GI PROFILE, STOOL, PCR

## 2021-01-03 ENCOUNTER — Other Ambulatory Visit: Payer: Self-pay | Admitting: Gastroenterology

## 2021-01-03 ENCOUNTER — Ambulatory Visit (HOSPITAL_COMMUNITY)
Admission: RE | Admit: 2021-01-03 | Discharge: 2021-01-03 | Disposition: A | Discharge status: Home / Self Care | Payer: Medicare PPO | Source: Ambulatory Visit | Attending: Gastroenterology | Admitting: Gastroenterology

## 2021-01-03 ENCOUNTER — Other Ambulatory Visit: Payer: Self-pay

## 2021-01-03 DIAGNOSIS — K869 Disease of pancreas, unspecified: Secondary | ICD-10-CM | POA: Insufficient documentation

## 2021-01-03 MED ORDER — GADOBUTROL 1 MMOL/ML IV SOLN
7.5000 mL | Freq: Once | INTRAVENOUS | Status: AC | PRN
  Administered 2021-01-03: 7.5 mL via INTRAVENOUS

## 2021-02-23 DIAGNOSIS — N183 Chronic kidney disease, stage 3 unspecified: Secondary | ICD-10-CM | POA: Insufficient documentation

## 2021-03-15 ENCOUNTER — Other Ambulatory Visit: Payer: Self-pay | Admitting: Gastroenterology

## 2021-03-15 DIAGNOSIS — R1013 Epigastric pain: Secondary | ICD-10-CM

## 2021-03-15 DIAGNOSIS — K219 Gastro-esophageal reflux disease without esophagitis: Secondary | ICD-10-CM

## 2021-04-04 NOTE — Progress Notes (Deleted)
Referring Provider: [name], MD Primary Care Physician:  [name], MD Primary GI Physician: Dr. Gala Romney  No chief complaint on file.   HPI:   Donald Klein is a 75 y.o. male presenting today for follow-up of GERD and chronic diarrhea.   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  Last EGD and colonoscopy June 2020.  Colonoscopy with markedly redundant colon and benign appearing noncritical colonic stricture, inability to identify landmarks of cecum and ileocecal valve.  EGD with appearance of Barrett's esophagus the biopsy with reactive/regenerative changes, moderate gastric gastric mucosa with some deformity likely related to prior surgery biopsied (chronic gastritis).  Virtual colonoscopy August 2020 normal.  No recommendations to repeat colonoscopy or EGD.  Last seen in our office 12/11/2020.  GERD well controlled on Nexium twice daily.  Occasionally taking OTC antacid for breakthrough symptoms, particularly if eating late.  Continued with 7-8 BMs daily, some nocturnal stools, and intermittent abdominal cramping prior to bowel movements that improved thereafter.  He was taking 2 Imodium if going out.  Reported Metamucil worsened to diarrhea.  No BRBPR or melena.  Reviewed outside CT imaging which showed increased size of pancreatic lesion to 9 mm from 5 mm previously.  He was asymptomatic to this.  Plan to update stool studies, check TSH, screen for celiac disease, continue Nexium twice daily, lactose-free and low-fat diet, and discuss possibility of repeat MRI versus EUS for pancreatic lesion evaluation with Dr. Gala Romney.  Case was discussed with Dr. Gala Romney who recommended discussing with  GI providers in [place].  Discussed with Dr. Ardis Hughs who reviewed patient's chart and stated there were no concerning signs on his most recent CT that would warrant sampling of this lesion.  He recommended MRI  MRI/MRCP 01/03/2021 revealing 8 mm likely IPMN.  Also redemonstrated known GIST tumor between the stomach and transverse colon.  Recommend repeat MRI in 2 years. He was placed on recall.   TSH within normal limits.  Celiac screen negative.  GI profile panel negative.    Today:    Consider decreasing Nexium to once daily and pancreatic enzymes.   Past Medical History:  Diagnosis Date   Anemia    Anxiety    Barrett esophagus    Last EGD 05/27/08 Barrett's without dyplasia. EGD due 05/2011   Chronic back pain    Colitis    In 1980s, dx with UC by Dr. 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[place])    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   Perceptive hearing loss, both sides    weras hearing aides both ears   Prostate cancer ([place])    PTSD (post-traumatic stress disorder)  PTSD (post-traumatic stress disorder)    Pulmonary nodules chest ct 08-25-2019   stable per chest ct   Skin abnormality 05/31/2020   sore on right shin x 1 week no drainage healing wife putting neosporin on daily   Sleep apnea    no cpap did not tolerate cpap    Small bowel tumor    presented with SBO (distal-near TI), path showed spindle cell neoplasm 6.5cm   Tubular adenoma of colon 10/08/2010   Last colonosocpy 1 cecal TA   Wears glasses     Past Surgical History:   Procedure Laterality Date   APPENDECTOMY  July 2014   at time of small bowel resection   BIOPSY  01/06/2018   Procedure: BIOPSY;  Surgeon: Daneil Dolin, MD;  [facility];  Service: Endoscopy;;  gastric biopsy , esophageal biopsy   BIOPSY  10/29/2018   Procedure: BIOPSY;  Surgeon: Daneil Dolin, MD;  [facility];  Service: Endoscopy;;  gastric, esophageal   CARDIAC CATHETERIZATION  2001   saw cardiology for 1 year and released   CHOLECYSTECTOMY  1980's or 1990's   COLONOSCOPY  10/08/2010   Normal rectum/tubular adenoma/normal random biopsy. Next TCS due 10/2015.   COLONOSCOPY N/A 09/30/2014   Dr. Gala Romney: Redunant colon . Status post segmental biopsy and stool sampling. Negative stool samples and colonic biopsies.    COLONOSCOPY WITH PROPOFOL N/A 10/29/2018   Surgeon: Daneil Dolin, MD; markedly redundant colon with benign-appearing noncritical colonic stricture, inability to identify landmarks of cecum and ileocecal valve.  Apparently patient had virtual colonoscopy thereafter.  Dr. Gala Romney did not recommend repeating colonoscopy.   ESOPHAGOGASTRODUODENOSCOPY  07/31/2011   Dr. Doyce Para segment Barrett's esophagus s/p bx Hiatal hernia. Duodenal diverticulum. No dysplasia.Next EGD 07/2014.   ESOPHAGOGASTRODUODENOSCOPY N/A 09/30/2014   Dr. Gala Romney: Abnormal distal esophagus consistant with prior diagnosis of short segment Barrett's esophagus status post biopsy. 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 Hiatal hernia. Abnormal Gastric mucosa of uncertain significance status post gastric biopsy. path with +Barrett's esophagus but no dysplasia, mild chronic gastritis. Surveillance for Barrett's due in 2019   ESOPHAGOGASTRODUODENOSCOPY N/A 01/06/2018   Procedure: ESOPHAGOGASTRODUODENOSCOPY (EGD);  Surgeon: Daneil Dolin, MD;  [facility];  Service: Endoscopy;  Laterality: N/A;  12:00pm   ESOPHAGOGASTRODUODENOSCOPY (EGD) WITH PROPOFOL N/A 10/29/2018    Surgeon:  Daneil Dolin, MD;  appearance of Barrett's esophagus though biopsy with reactive/regenerative changes, moderate gastric mucosa with some deformity likely related to prior surgery s/p biopsy (chronic gastritis), normal examined duodenum.   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;  Service: Urology;  Laterality: N/A;   SPACE OAR INSTILLATION N/A 06/02/2020   Procedure: SPACE OAR INSTILLATION;  Surgeon: [name], MD;  Location: [facility];  Service: Urology;  Laterality: N/A;    Current Outpatient Medications  Medication Sig Dispense Refill   alfuzosin (UROXATRAL) 10 MG 24 hr tablet Take  1 tablet (10 mg total) by mouth daily with breakfast. 30 tablet 11   Artificial Tear Ointment (DRY EYES OP) Place 1 drop into both eyes 2 (two) times daily as needed (dry eyes).      aspirin 81 MG EC tablet  (Patient not taking: Reported on 12/11/2020)     CALCIUM PO Take 1 tablet by mouth 2 (two) times daily.     Cholecalciferol 25 MCG (1000 UT) tablet Take by mouth daily.     diazepam (VALIUM) 5 MG tablet Take 5 mg by mouth daily as needed for anxiety.     esomeprazole (NEXIUM) 20 MG capsule TAKE 1 CAPSULE(20 MG) BY MOUTH TWICE DAILY BEFORE A MEAL 60 capsule 5   ferrous sulfate 325 (65 FE) MG tablet Take 325 mg by mouth. Monday Wednesday Friday     gabapentin (NEURONTIN) 400 MG capsule Take 400  mg by mouth at bedtime.     GARLIC PO Take by mouth daily.     hydrocortisone (ANUSOL-HC) 2.5 % rectal cream Place 1 application rectally 2 (two) times daily. For up to 10 days at a time 30 g 2   imatinib (GLEEVEC) 400 MG tablet Take 1 tablet by mouth at bedtime.     JANUVIA 100 MG tablet Take 100 mg by mouth daily.     latanoprost (XALATAN) 0.005 % ophthalmic solution SMARTSIG:In Eye(s)     loperamide (IMODIUM) 2 MG capsule Take by mouth as needed for diarrhea or loose stools.     losartan (COZAAR) 100 MG tablet Take 100 mg by mouth daily.     LOVAZA 1 G capsule Take 1 capsule by mouth 2 (two) times daily.      Magnesium 400 MG CAPS Take by mouth daily.     metoprolol (LOPRESSOR) 50 MG tablet Take 50-75 mg by mouth 2 (two) times daily. 50mg  in am and 75mg  at night     Multiple Vitamins-Minerals (MULTIVITAMIN WITH MINERALS) tablet Take 1 tablet by mouth daily. Centrum adult 50 plus     neomycin-bacitracin-polymyxin (NEOSPORIN) ointment Apply 1 application topically every 12 (twelve) hours. To right shin daily     nitroGLYCERIN (NITROSTAT) 0.4 MG SL tablet Place 0.4 mg under the tongue every 5 (five) minutes as needed for chest pain.     NON FORMULARY Eye drop lantaprost 125 mg 1 drop both eyes at hs     Omega-3 Fatty Acids (FISH OIL) 1000 MG CAPS Take by mouth in the morning and at bedtime.     QUEtiapine (SEROQUEL) 50 MG tablet Take 25 mg by mouth at bedtime as needed for depression. Ordered to take 1 and 1/2 tabs prn qhs Taking 25 mg prn qhs prn     rosuvastatin (CRESTOR) 10 MG tablet Take 10 mg by mouth daily.      sertraline (ZOLOFT) 100 MG tablet Take 100 mg by mouth daily as needed (PTSD).     sildenafil (VIAGRA) 100 MG tablet Take 1 tablet (100 mg total) by mouth as needed for erectile dysfunction. 10 tablet 5   traMADol (ULTRAM) 50 MG tablet Take 1 tablet (50 mg total) by mouth every 6 (six) hours as needed. 30 tablet 0   vitamin C (ASCORBIC ACID) 250 MG tablet Take 250 mg by mouth  daily.     No current facility-administered medications for this visit.    Allergies as of 04/05/2021 - Review Complete 12/11/2020  Allergen Reaction Noted   Lisinopril Other (See Comments) 12/29/2017   Metformin Diarrhea 03/05/2019  Enoxaparin Rash 01/01/2013   Lovenox [enoxaparin sodium] Hives and Rash 09/30/2014   Penicillins Rash     Family History  Problem Relation Age of Onset   Heart disease Mother    Suicidality Mother    Heart attack Father        2   Heart disease Father    Heart disease Sister    Prostate cancer Brother    Colon cancer Neg Hx    Breast cancer Neg Hx    Pancreatic cancer Neg Hx     Social History   Socioeconomic History   Marital status: Married    Spouse name: Not on file   Number of children: 1   Years of education: Not on file   Highest education level: Not on file  Occupational History   Occupation: disability  Tobacco Use   Smoking status: Never   Smokeless tobacco: Never  Vaping Use   Vaping Use: Never used  Substance and Sexual Activity   Alcohol use: No    Alcohol/week: 0.0 standard drinks   Drug use: No   Sexual activity: Yes    Partners: Female    Birth control/protection: None    Comment: spouse  Other Topics Concern   Not on file  Social History Narrative   Not on file   Social Determinants of Health   Financial Resource Strain: Not on file  Food Insecurity: Not on file  Transportation Needs: Not on file  Physical Activity: Not on file  Stress: Not on file  Social Connections: Not on file    Review of Systems: Gen: Denies fever, chills, cold or flulike symptoms, presyncope, syncope. CV: Denies chest pain, palpitations. Resp: Denies dyspnea or cough. GI: See HPI Heme: See HPI  Physical Exam: There were no vitals taken for this visit. General:   Alert and oriented. No distress noted. Pleasant and cooperative.  Head:  Normocephalic and atraumatic. Eyes:  Conjuctiva clear without scleral icterus. Heart:   S1, S2 present without murmurs appreciated. Lungs:  Clear to auscultation bilaterally. No wheezes, rales, or rhonchi. No distress.  Abdomen:  +BS, soft, non-tender and non-distended. No rebound or guarding. No HSM or masses noted. Msk:  Symmetrical without gross deformities. Normal posture. Extremities:  Without edema. Neurologic:  Alert and  oriented x4 Psych:  Normal mood and affect.    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  EGD and colonoscopy up-to-date in June 2020 detailed above with no recommendations to repeat EGD or colonoscopy.  He is presenting today for follow-up of GERD and chronic diarrhea.  GERD:  Chronic diarrhea: Chronic watery diarrhea since the 1980s.  Prior random colon biopsies negative for microscopic colitis, TSH within normal limits, celiac screen negative, stool studies negative.  Symptoms likely multifactorial in the setting of bile salt diarrhea s/p cholecystectomy, dietary intolerances, med effect with magnesium, Gleevec, and Januvia, malabsorption related to prior small bowel resections, and could also have a component of pancreatic insufficiency. ***

## 2021-04-05 ENCOUNTER — Encounter: Payer: Self-pay | Admitting: Internal Medicine

## 2021-04-05 ENCOUNTER — Ambulatory Visit: Payer: Medicare PPO | Admitting: Gastroenterology

## 2021-04-13 ENCOUNTER — Other Ambulatory Visit: Payer: Self-pay

## 2021-04-13 ENCOUNTER — Other Ambulatory Visit: Payer: Medicare PPO

## 2021-04-13 DIAGNOSIS — C61 Malignant neoplasm of prostate: Secondary | ICD-10-CM

## 2021-04-14 LAB — PSA: Prostate Specific Ag, Serum: 0.8 ng/mL (ref 0.0–4.0)

## 2021-04-18 ENCOUNTER — Ambulatory Visit (INDEPENDENT_AMBULATORY_CARE_PROVIDER_SITE_OTHER): Payer: Medicare PPO | Admitting: Urology

## 2021-04-18 ENCOUNTER — Other Ambulatory Visit: Payer: Self-pay

## 2021-04-18 ENCOUNTER — Encounter: Payer: Self-pay | Admitting: Urology

## 2021-04-18 VITALS — BP 195/66 | HR 64

## 2021-04-18 DIAGNOSIS — N5201 Erectile dysfunction due to arterial insufficiency: Secondary | ICD-10-CM | POA: Diagnosis not present

## 2021-04-18 DIAGNOSIS — N401 Enlarged prostate with lower urinary tract symptoms: Secondary | ICD-10-CM | POA: Diagnosis not present

## 2021-04-18 DIAGNOSIS — C61 Malignant neoplasm of prostate: Secondary | ICD-10-CM | POA: Diagnosis not present

## 2021-04-18 DIAGNOSIS — R351 Nocturia: Secondary | ICD-10-CM

## 2021-04-18 DIAGNOSIS — N138 Other obstructive and reflux uropathy: Secondary | ICD-10-CM

## 2021-04-18 LAB — URINALYSIS, ROUTINE W REFLEX MICROSCOPIC
Bilirubin, UA: NEGATIVE
Glucose, UA: NEGATIVE
Ketones, UA: NEGATIVE
Leukocytes,UA: NEGATIVE
Nitrite, UA: NEGATIVE
RBC, UA: NEGATIVE
Specific Gravity, UA: >1.03 — ABNORMAL HIGH (ref 1.005–1.030)
Urobilinogen, Ur: 0.2 mg/dL (ref 0.2–1.0)
pH, UA: 5.5 (ref 5.0–7.5)

## 2021-04-18 LAB — MICROSCOPIC EXAMINATION
RBC, Urine: NONE SEEN /hpf (ref 0–2)
Renal Epithel, UA: NONE SEEN /hpf
WBC, UA: NONE SEEN /hpf (ref 0–5)

## 2021-04-18 LAB — BLADDER SCAN AMB NON-IMAGING: Scan Result: 48

## 2021-04-18 MED ORDER — SILODOSIN 8 MG PO CAPS
8.0000 mg | ORAL_CAPSULE | Freq: Every day | ORAL | 11 refills | Status: DC
Start: 2021-04-18 — End: 2021-10-22

## 2021-04-18 NOTE — Patient Instructions (Signed)

## 2021-04-18 NOTE — Progress Notes (Signed)
post void residual=48  Urological Symptom Review  Patient is experiencing the following symptoms: Get up at night to urinate Leakage of urine Stream starts and stops Erection problems (male only)   Review of Systems  Gastrointestinal (upper)  : Indigestion/heartburn  Gastrointestinal (lower) : Diarrhea Constipation  Constitutional : Fatigue  Skin: Skin rash/lesion Itching  Eyes: Blurred vision  Ear/Nose/Throat : Sinus problems  Hematologic/Lymphatic: Easy bruising  Cardiovascular : Leg swelling  Respiratory : Negative for respiratory symptoms  Endocrine: Negative for endocrine symptoms  Musculoskeletal: Back pain Joint pain  Neurological: Negative for neurological symptoms  Psychologic: Depression Anxiety

## 2021-04-18 NOTE — Progress Notes (Signed)
04/18/2021 9:13 AM   Donald Klein Sep 07, 1945 [id number]  Referring provider: [name], MD [address],  [place] [address]  Followup BPH and Prostate cancer   HPI: Mr Wittmeyer is a 75yo here for followup for prostate cancer and BPH. PSA decreased to 0.8 from 1.1. IPSS 9 QOL 3. Nocturia 3x. Urine stream intermittently weak. He has urinary hesitancy on uroxatral 10mg . He has erectile dysfunction which he uses sildenafil 100mg  with good results. No other complaints today   PMH: Past Medical History:  Diagnosis Date   Anemia    Anxiety    Barrett esophagus    Last EGD 05/27/08 Barrett's without dyplasia. EGD due 05/2011   Chronic back pain    Colitis    In 1980s, dx with UC by Dr. 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   Perceptive hearing loss, both sides    weras hearing aides both ears   Prostate cancer ([place])    PTSD (post-traumatic stress disorder)    PTSD (post-traumatic stress disorder)    Pulmonary nodules chest ct 08-25-2019   stable per chest ct   Skin abnormality 05/31/2020   sore on right shin x 1 week no drainage healing wife putting neosporin on daily   Sleep apnea    no cpap did not tolerate cpap    Small bowel tumor    presented with SBO (distal-near TI), path showed spindle cell neoplasm 6.5cm   Tubular adenoma of colon 10/08/2010   Last colonosocpy 1 cecal TA   Wears glasses     Surgical History: Past Surgical History:  Procedure Laterality Date    APPENDECTOMY  July 2014   at time of small bowel resection   BIOPSY  01/06/2018   Procedure: BIOPSY;  Surgeon: Daneil Dolin, MD;  [facility];  Service: Endoscopy;;  gastric biopsy , esophageal biopsy   BIOPSY  10/29/2018   Procedure: BIOPSY;  Surgeon: Daneil Dolin, MD;  [facility];  Service: Endoscopy;;  gastric, esophageal   CARDIAC CATHETERIZATION  2001   saw cardiology for 1 year and released   CHOLECYSTECTOMY  1980's or 1990's   COLONOSCOPY  10/08/2010   Normal rectum/tubular adenoma/normal random biopsy. Next TCS due 10/2015.   COLONOSCOPY N/A 09/30/2014   Dr. Gala Romney: Redunant colon . Status post segmental biopsy and stool sampling. Negative stool samples and colonic biopsies.    COLONOSCOPY WITH PROPOFOL N/A 10/29/2018   Surgeon: Daneil Dolin, MD; markedly redundant colon with benign-appearing noncritical colonic stricture, inability to identify landmarks of cecum and ileocecal valve.  Apparently patient had virtual colonoscopy thereafter.  Dr. Gala Romney did not recommend repeating colonoscopy.   ESOPHAGOGASTRODUODENOSCOPY  07/31/2011   Dr. Doyce Para segment Barrett's esophagus s/p bx Hiatal hernia. Duodenal diverticulum. No dysplasia.Next EGD 07/2014.   ESOPHAGOGASTRODUODENOSCOPY N/A 09/30/2014   Dr. Gala Romney: Abnormal  distal esophagus consistant with prior diagnosis of short segment Barrett's esophagus status post biopsy. 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 Hiatal hernia. Abnormal Gastric mucosa of uncertain significance status post gastric biopsy. path with +Barrett's esophagus but no dysplasia, mild chronic gastritis. Surveillance for Barrett's due in 2019   ESOPHAGOGASTRODUODENOSCOPY N/A 01/06/2018   Procedure: ESOPHAGOGASTRODUODENOSCOPY (EGD);  Surgeon: Daneil Dolin, MD;  [facility];  Service: Endoscopy;  Laterality: N/A;  12:00pm   ESOPHAGOGASTRODUODENOSCOPY (EGD) WITH PROPOFOL N/A 10/29/2018    Surgeon: Daneil Dolin, MD;  appearance  of Barrett's esophagus though biopsy with reactive/regenerative changes, moderate gastric mucosa with some deformity likely related to prior surgery s/p biopsy (chronic gastritis), normal examined duodenum.   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)   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 on 07/2015    PROSTATE BIOPSY  2021   RADIOACTIVE SEED IMPLANT N/A 06/02/2020   Procedure: RADIOACTIVE SEED IMPLANT/BRACHYTHERAPY IMPLANT;  Surgeon: [name], MD;  Location: [facility];  Service: Urology;  Laterality: N/A;   SPACE OAR INSTILLATION N/A 06/02/2020   Procedure: SPACE OAR INSTILLATION;  Surgeon: [name], MD;  Location: [facility];  Service: Urology;  Laterality: N/A;    Home Medications:  Allergies as of 04/18/2021       Reactions   Lisinopril Other (See Comments)   Pt unsure    Metformin Diarrhea   Enoxaparin Rash   Other reaction(s): Eruption of skin   Lovenox [enoxaparin Sodium] Hives, Rash   Penicillins Rash    Has patient had a PCN reaction causing immediate rash, facial/tongue/throat swelling, SOB or lightheadedness with hypotension: Yes Has patient had a PCN reaction causing severe rash involving mucus membranes or skin necrosis: No Has patient had a PCN reaction that required hospitalization No Has patient had a PCN reaction occurring within the last 10 years: No If all of the above answers are "NO", then may proceed with Cephalosporin use.        Medication List        Accurate as of April 18, 2021  9:13 AM. If you have any questions, ask your nurse or doctor.           alfuzosin 10 MG 24 hr tablet Commonly known as: UROXATRAL Take 1 tablet (10 mg total) by mouth daily with breakfast.   aspirin 81 MG EC tablet   CALCIUM PO Take 1 tablet by mouth 2 (two) times daily.   Cholecalciferol 25 MCG (1000 UT) tablet Take by mouth daily.   diazepam 5 MG tablet Commonly known as: VALIUM Take 5 mg by mouth daily as needed for anxiety.   DRY EYES OP Place 1 drop into both eyes 2 (two) times daily as needed (dry eyes).   esomeprazole 20 MG capsule Commonly known as: NEXIUM TAKE 1 CAPSULE(20 MG) BY MOUTH TWICE DAILY BEFORE A MEAL   ferrous sulfate 325 (65 FE) MG tablet Take 325 mg by mouth. Monday Wednesday Friday   Fish Oil 1000 MG Caps Take by mouth in the morning and at bedtime.   gabapentin 400 MG capsule Commonly known as: NEURONTIN Take 400 mg by mouth at bedtime.  GARLIC PO Take by mouth daily.   hydrocortisone 2.5 % rectal cream Commonly known as: ANUSOL-HC Place 1 application rectally 2 (two) times daily. For up to 10 days at a time   imatinib 400 MG tablet Commonly known as: GLEEVEC Take 1 tablet by mouth at bedtime.   Januvia 100 MG tablet Generic drug: sitaGLIPtin Take 100 mg by mouth daily.   latanoprost 0.005 % ophthalmic solution Commonly known as: XALATAN SMARTSIG:In Eye(s)   loperamide 2 MG capsule Commonly known as: IMODIUM Take by mouth as needed for diarrhea or loose stools.   losartan 100 MG tablet Commonly known as: COZAAR Take 100 mg by mouth daily.   Lovaza 1 g capsule Generic drug: omega-3 acid ethyl esters Take 1 capsule by mouth 2 (two) times daily.   Magnesium 400 MG Caps Take by mouth daily.   metoprolol tartrate 50 MG tablet Commonly known as: LOPRESSOR Take 50-75 mg by mouth 2 (two) times daily. 50mg  in am and 75mg  at night   multivitamin with minerals tablet Take 1 tablet by mouth daily. Centrum adult 50 plus   neomycin-bacitracin-polymyxin ointment Commonly known as: NEOSPORIN Apply 1  application topically every 12 (twelve) hours. To right shin daily   nitroGLYCERIN 0.4 MG SL tablet Commonly known as: NITROSTAT Place 0.4 mg under the tongue every 5 (five) minutes as needed for chest pain.   NON FORMULARY Eye drop lantaprost 125 mg 1 drop both eyes at hs   QUEtiapine 50 MG tablet Commonly known as: SEROQUEL Take 25 mg by mouth at bedtime as needed for depression. Ordered to take 1 and 1/2 tabs prn qhs Taking 25 mg prn qhs prn   rosuvastatin 10 MG tablet Commonly known as: CRESTOR Take 10 mg by mouth daily.   sertraline 100 MG tablet Commonly known as: ZOLOFT Take 100 mg by mouth daily as needed (PTSD).   sildenafil 100 MG tablet Commonly known as: VIAGRA Take 1 tablet (100 mg total) by mouth as needed for erectile dysfunction.   traMADol 50 MG tablet Commonly known as: Ultram Take 1 tablet (50 mg total) by mouth every 6 (six) hours as needed.   vitamin C 250 MG tablet Commonly known as: ASCORBIC ACID Take 250 mg by mouth daily.        Allergies:  Allergies  Allergen Reactions   Lisinopril Other (See Comments)    Pt unsure    Metformin Diarrhea   Enoxaparin Rash    Other reaction(s): Eruption of skin   Lovenox [Enoxaparin Sodium] Hives and Rash   Penicillins Rash     Has patient had a PCN reaction causing immediate rash, facial/tongue/throat swelling, SOB or lightheadedness with hypotension: Yes Has patient had a PCN reaction causing severe rash involving mucus membranes or skin necrosis: No Has patient had a PCN reaction that required hospitalization No Has patient had a PCN reaction occurring within the last 10 years: No If all of the above answers are "NO", then may proceed with Cephalosporin use.     Family History: Family History  Problem Relation Age of Onset   Heart disease Mother    Suicidality Mother    Heart attack Father        25   Heart disease Father    Heart disease Sister    Prostate cancer Brother    Colon cancer Neg  Hx    Breast cancer Neg Hx    Pancreatic cancer Neg Hx     Social History:  reports that he  has never smoked. He has never used smokeless tobacco. He reports that he does not drink alcohol and does not use drugs.  ROS: All other review of systems were reviewed and are negative except what is noted above in HPI  Physical Exam: BP (!) 195/66    Pulse 64   Constitutional:  Alert and oriented, No acute distress. HEENT: [place] AT, moist mucus membranes.  Trachea midline, no masses. Cardiovascular: No clubbing, cyanosis, or edema. Respiratory: Normal respiratory effort, no increased work of breathing. GI: Abdomen is soft, nontender, nondistended, no abdominal masses GU: No CVA tenderness.  Lymph: No cervical or inguinal lymphadenopathy. Skin: No rashes, bruises or suspicious lesions. Neurologic: Grossly intact, no focal deficits, moving all 4 extremities. Psychiatric: Normal mood and affect.  Laboratory Data: Lab Results  Component Value Date   WBC 4.4 05/30/2020   HGB 12.5 (L) 05/30/2020   HCT 36.6 (L) 05/30/2020   MCV 97.1 05/30/2020   PLT 170 05/30/2020    Lab Results  Component Value Date   CREATININE 1.29 (H) 05/30/2020    Lab Results  Component Value Date   PSA 12.0 (H) 11/22/2019    No results found for: TESTOSTERONE  No results found for: HGBA1C  Urinalysis    Component Value Date/Time   COLORURINE YELLOW 07/12/2018 1158   APPEARANCEUR Clear 10/18/2020 1542   LABSPEC 1.012 07/12/2018 1158   PHURINE 5.0 07/12/2018 1158   GLUCOSEU Negative 10/18/2020 1542   HGBUR NEGATIVE 07/12/2018 1158   BILIRUBINUR Negative 10/18/2020 1542   KETONESUR NEGATIVE 07/12/2018 1158   PROTEINUR 1+ (A) 10/18/2020 1542   PROTEINUR NEGATIVE 07/12/2018 1158   UROBILINOGEN 0.2 03/29/2010 0803   NITRITE Negative 10/18/2020 1542   NITRITE NEGATIVE 07/12/2018 1158   LEUKOCYTESUR Negative 10/18/2020 1542   LEUKOCYTESUR NEGATIVE 07/12/2018 1158    Lab Results  Component Value Date    LABMICR See below: 10/18/2020   WBCUA 0-5 10/18/2020   LABEPIT 0-10 10/18/2020   MUCUS Present 10/18/2020   BACTERIA None seen 10/18/2020    Pertinent Imaging:  No results found for this or any previous visit.  No results found for this or any previous visit.  No results found for this or any previous visit.  No results found for this or any previous visit.  No results found for this or any previous visit.  No results found for this or any previous visit.  No results found for this or any previous visit.  No results found for this or any previous visit.   Assessment & Plan:    1. Nocturia -we will trial silodosin 8mg  qhs - Urinalysis, Routine w reflex microscopic - BLADDER SCAN AMB NON-IMAGING  2. Prostate cancer ([place]) -RTC 6 month with PSA  3. Benign prostatic hyperplasia with urinary obstruction -Silodosin 8mg  qhs  4. Erectile dysfunction -Sildenafil 100mg  prn   No follow-ups on file.  Nicolette Bang, MD  [facility] [place]

## 2021-06-10 ENCOUNTER — Other Ambulatory Visit: Payer: Self-pay | Admitting: Urology

## 2021-06-19 DIAGNOSIS — C49A2 Gastrointestinal stromal tumor of stomach: Secondary | ICD-10-CM | POA: Diagnosis not present

## 2021-07-31 DIAGNOSIS — Z8546 Personal history of malignant neoplasm of prostate: Secondary | ICD-10-CM | POA: Diagnosis not present

## 2021-08-09 DIAGNOSIS — K0401 Reversible pulpitis: Secondary | ICD-10-CM | POA: Diagnosis not present

## 2021-08-22 DIAGNOSIS — J069 Acute upper respiratory infection, unspecified: Secondary | ICD-10-CM | POA: Diagnosis not present

## 2021-08-22 DIAGNOSIS — I10 Essential (primary) hypertension: Secondary | ICD-10-CM | POA: Diagnosis not present

## 2021-09-10 ENCOUNTER — Other Ambulatory Visit: Payer: Self-pay | Admitting: Urology

## 2021-09-20 DIAGNOSIS — E1136 Type 2 diabetes mellitus with diabetic cataract: Secondary | ICD-10-CM | POA: Diagnosis not present

## 2021-09-20 DIAGNOSIS — H25813 Combined forms of age-related cataract, bilateral: Secondary | ICD-10-CM | POA: Diagnosis not present

## 2021-09-20 DIAGNOSIS — H524 Presbyopia: Secondary | ICD-10-CM | POA: Diagnosis not present

## 2021-09-20 DIAGNOSIS — H401131 Primary open-angle glaucoma, bilateral, mild stage: Secondary | ICD-10-CM | POA: Diagnosis not present

## 2021-10-02 DIAGNOSIS — C49A4 Gastrointestinal stromal tumor of large intestine: Secondary | ICD-10-CM | POA: Diagnosis not present

## 2021-10-02 DIAGNOSIS — C49A2 Gastrointestinal stromal tumor of stomach: Secondary | ICD-10-CM | POA: Diagnosis not present

## 2021-10-02 DIAGNOSIS — D649 Anemia, unspecified: Secondary | ICD-10-CM | POA: Diagnosis not present

## 2021-10-02 DIAGNOSIS — Z8546 Personal history of malignant neoplasm of prostate: Secondary | ICD-10-CM | POA: Diagnosis not present

## 2021-10-08 ENCOUNTER — Other Ambulatory Visit: Payer: Self-pay

## 2021-10-08 DIAGNOSIS — C61 Malignant neoplasm of prostate: Secondary | ICD-10-CM

## 2021-10-10 ENCOUNTER — Other Ambulatory Visit: Payer: Medicare PPO

## 2021-10-12 DIAGNOSIS — H53149 Visual discomfort, unspecified: Secondary | ICD-10-CM | POA: Diagnosis not present

## 2021-10-12 DIAGNOSIS — H524 Presbyopia: Secondary | ICD-10-CM | POA: Diagnosis not present

## 2021-10-15 ENCOUNTER — Other Ambulatory Visit: Payer: Medicare PPO

## 2021-10-17 ENCOUNTER — Ambulatory Visit: Payer: Medicare PPO | Admitting: Urology

## 2021-10-18 LAB — PSA: Prostate Specific Ag, Serum: 0.5 ng/mL (ref 0.0–4.0)

## 2021-10-22 ENCOUNTER — Encounter: Payer: Self-pay | Admitting: Urology

## 2021-10-22 ENCOUNTER — Ambulatory Visit (INDEPENDENT_AMBULATORY_CARE_PROVIDER_SITE_OTHER): Payer: Medicare PPO | Admitting: Urology

## 2021-10-22 VITALS — BP 172/68 | HR 62

## 2021-10-22 DIAGNOSIS — N5201 Erectile dysfunction due to arterial insufficiency: Secondary | ICD-10-CM

## 2021-10-22 DIAGNOSIS — R351 Nocturia: Secondary | ICD-10-CM

## 2021-10-22 DIAGNOSIS — N138 Other obstructive and reflux uropathy: Secondary | ICD-10-CM

## 2021-10-22 DIAGNOSIS — C61 Malignant neoplasm of prostate: Secondary | ICD-10-CM

## 2021-10-22 DIAGNOSIS — Z8546 Personal history of malignant neoplasm of prostate: Secondary | ICD-10-CM

## 2021-10-22 DIAGNOSIS — N401 Enlarged prostate with lower urinary tract symptoms: Secondary | ICD-10-CM | POA: Diagnosis not present

## 2021-10-22 LAB — URINALYSIS, ROUTINE W REFLEX MICROSCOPIC
Bilirubin, UA: NEGATIVE
Glucose, UA: NEGATIVE
Leukocytes,UA: NEGATIVE
Nitrite, UA: NEGATIVE
RBC, UA: NEGATIVE
Specific Gravity, UA: 1.025 (ref 1.005–1.030)
Urobilinogen, Ur: 0.2 mg/dL (ref 0.2–1.0)
pH, UA: 5.5 (ref 5.0–7.5)

## 2021-10-22 LAB — MICROSCOPIC EXAMINATION

## 2021-10-22 MED ORDER — MIRABEGRON ER 25 MG PO TB24: 25.0000 mg | ORAL_TABLET | Freq: Every day | ORAL | 0 refills | Status: DC

## 2021-10-22 MED ORDER — SILODOSIN 8 MG PO CAPS: 8.0000 mg | ORAL_CAPSULE | Freq: Every day | ORAL | 11 refills | Status: DC

## 2021-10-22 NOTE — Patient Instructions (Signed)

## 2021-10-22 NOTE — Progress Notes (Signed)
10/22/2021 9:07 AM   Donald Klein 1945-09-07 [id number]  Referring provider: [name], MD [address],  [place] [address]  Followup BPH and prostate cancer   HPI: [name] is a 76yo here for followup for BPH, erectile dysfunction and prostate cancer. PSa decreased to 0.5 from 0.8. IPSS 13 QOL 2. His urine stream is strong and rapaflo '8mg'$ . He has urinary urgency and daily urge incontinence. Urge incontinent episodes occur 2-3x per day.  Nocturia 2-3x. He uses sildenafil '100mg'$  prn with good results.    PMH: Past Medical History:  Diagnosis Date   Anemia    Anxiety    Barrett esophagus    Last EGD 05/27/08 Barrett's without dyplasia. EGD due 05/2011   Chronic back pain    Colitis    In 1980s, dx with UC by Dr. 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)    PTSD (post-traumatic stress disorder)    PTSD (post-traumatic stress disorder)    Pulmonary nodules chest ct 08-25-2019   stable per chest ct   Skin abnormality 05/31/2020   sore on right shin x 1 week no drainage healing wife putting neosporin on daily   Sleep apnea    no cpap did not tolerate cpap    Small bowel tumor    presented with SBO (distal-near TI), path showed spindle cell neoplasm 6.5cm   Tubular adenoma of colon 10/08/2010   Last colonosocpy 1 cecal TA   Wears glasses     Surgical History: Past Surgical  History:  Procedure Laterality Date   APPENDECTOMY  July 2014   at time of small bowel resection   BIOPSY  01/06/2018   Procedure: BIOPSY;  Surgeon: Donald Dolin, MD;  [facility];  Service: Endoscopy;;  gastric biopsy , esophageal biopsy   BIOPSY  10/29/2018   Procedure: BIOPSY;  Surgeon: Donald Dolin, MD;  [facility];  Service: Endoscopy;;  gastric, esophageal   CARDIAC CATHETERIZATION  2001   saw cardiology for 1 year and released   CHOLECYSTECTOMY  1980's or 1990's   COLONOSCOPY  10/08/2010   Normal rectum/tubular adenoma/normal random biopsy. Next TCS due 10/2015.   COLONOSCOPY N/A 09/30/2014   Dr. Gala Klein: Redunant colon . Status post segmental biopsy and stool sampling. Negative stool samples and colonic biopsies.    COLONOSCOPY WITH PROPOFOL N/A 10/29/2018   Surgeon: Donald Dolin, MD; markedly redundant colon with benign-appearing noncritical colonic stricture, inability to identify landmarks of cecum and ileocecal valve.  Apparently patient had virtual colonoscopy thereafter.  Dr. Gala Klein did not recommend repeating colonoscopy.   ESOPHAGOGASTRODUODENOSCOPY  07/31/2011   Dr. Doyce Klein segment Barrett's esophagus s/p bx Hiatal hernia. Duodenal diverticulum. No dysplasia.Next EGD 07/2014.  ESOPHAGOGASTRODUODENOSCOPY N/A 09/30/2014   Dr. Gala Klein: Abnormal distal esophagus consistant with prior diagnosis of short segment Barrett's esophagus status post biopsy. 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 Hiatal hernia. Abnormal Gastric mucosa of uncertain significance status post gastric biopsy. path with +Barrett's esophagus but no dysplasia, mild chronic gastritis. Surveillance for Barrett's due in 2019   ESOPHAGOGASTRODUODENOSCOPY N/A 01/06/2018   Procedure: ESOPHAGOGASTRODUODENOSCOPY (EGD);  Surgeon: Donald Dolin, MD;  [facility];  Service: Endoscopy;  Laterality: N/A;  12:00pm   ESOPHAGOGASTRODUODENOSCOPY (EGD) WITH PROPOFOL N/A 10/29/2018     Surgeon: Donald Dolin, MD;  appearance of Barrett's esophagus though biopsy with reactive/regenerative changes, moderate gastric mucosa with some deformity likely related to prior surgery s/p biopsy (chronic gastritis), normal examined duodenum.   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)   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 on 07/2015    PROSTATE BIOPSY  2021   RADIOACTIVE SEED IMPLANT N/A 06/02/2020   Procedure: RADIOACTIVE SEED IMPLANT/BRACHYTHERAPY IMPLANT;  Surgeon: Donald Gustin, MD;  Location: [facility];  Service: Urology;  Laterality: N/A;   SPACE OAR INSTILLATION N/A 06/02/2020   Procedure: SPACE OAR INSTILLATION;  Surgeon: Donald Gustin, MD;  Location: [facility];  Service: Urology;  Laterality: N/A;    Home Medications:  Allergies as of 10/22/2021       Reactions   Lisinopril Other (See Comments)   Pt unsure    Metformin Diarrhea   Enoxaparin Rash   Other reaction(s): Eruption of skin   Lovenox [enoxaparin Sodium] Hives, Rash   Penicillins Rash    Has patient had a PCN reaction causing immediate rash, facial/tongue/throat swelling, SOB or lightheadedness with hypotension: Yes Has patient had a PCN reaction causing severe rash involving mucus membranes or skin necrosis: No Has patient had a PCN reaction that required hospitalization No Has patient had a PCN reaction occurring within the last 10 years: No If all of the above answers are "NO", then may proceed with Cephalosporin use.        Medication List        Accurate as of October 22, 2021  9:07 AM. If you have any questions, ask  your nurse or doctor.          alfuzosin 10 MG 24 hr tablet Commonly known as: UROXATRAL TAKE 1 TABLET(10 MG) BY MOUTH DAILY WITH BREAKFAST   alfuzosin 10 MG 24 hr tablet Commonly known as: UROXATRAL TAKE 1 TABLET(10 MG) BY MOUTH DAILY WITH BREAKFAST   aspirin EC 81 MG tablet   CALCIUM PO Take 1 tablet by mouth 2 (two) times daily.   Cholecalciferol 25 MCG (1000 UT) tablet Take by mouth daily.   diazepam 5 MG tablet Commonly known as: VALIUM Take 5 mg by mouth daily as needed for anxiety.   DRY EYES OP Place 1 drop into both eyes 2 (two) times daily as needed (dry eyes).   esomeprazole 20 MG capsule Commonly known as: NEXIUM TAKE 1 CAPSULE(20 MG) BY MOUTH TWICE DAILY BEFORE A MEAL   ferrous sulfate 325 (65 FE) MG tablet Take 325 mg by mouth. Monday Wednesday Friday   Fish Oil 1000 MG  Caps Take by mouth in the morning and at bedtime.   gabapentin 400 MG capsule Commonly known as: NEURONTIN Take 400 mg by mouth at bedtime.   GARLIC PO Take by mouth daily.   hydrocortisone 2.5 % rectal cream Commonly known as: ANUSOL-HC Place 1 application rectally 2 (two) times daily. For up to 10 days at a time   imatinib 400 MG tablet Commonly known as: GLEEVEC Take 1 tablet by mouth at bedtime.   Januvia 100 MG tablet Generic drug: sitaGLIPtin Take 100 mg by mouth daily.   latanoprost 0.005 % ophthalmic solution Commonly known as: XALATAN SMARTSIG:In Eye(s)   loperamide 2 MG capsule Commonly known as: IMODIUM Take by mouth as needed for diarrhea or loose stools.   losartan 100 MG tablet Commonly known as: COZAAR Take 100 mg by mouth daily.   Lovaza 1 g capsule Generic drug: omega-3 acid ethyl esters Take 1 capsule by mouth 2 (two) times daily.   Magnesium 400 MG Caps Take by mouth daily.   metoprolol tartrate 50 MG tablet Commonly known as: LOPRESSOR Take 50-75 mg by mouth 2 (two) times daily. '50mg'$  in am and '75mg'$  at night   multivitamin with minerals  tablet Take 1 tablet by mouth daily. Centrum adult 50 plus   neomycin-bacitracin-polymyxin [id number] ointment Commonly known as: NEOSPORIN Apply 1 application topically every 12 (twelve) hours. To right shin daily   nitroGLYCERIN 0.4 MG SL tablet Commonly known as: NITROSTAT Place 0.4 mg under the tongue every 5 (five) minutes as needed for chest pain.   NON FORMULARY Eye drop lantaprost 125 mg 1 drop both eyes at hs   QUEtiapine 50 MG tablet Commonly known as: SEROQUEL Take 25 mg by mouth at bedtime as needed for depression. Ordered to take 1 and 1/2 tabs prn qhs Taking 25 mg prn qhs prn   rosuvastatin 10 MG tablet Commonly known as: CRESTOR Take 10 mg by mouth daily.   sertraline 100 MG tablet Commonly known as: ZOLOFT Take 100 mg by mouth daily as needed (PTSD).   sildenafil 100 MG tablet Commonly known as: VIAGRA TAKE 1 TABLET BY MOUTH AS NEEDED FOR ERECTILE DYSFUNCTION   silodosin 8 MG Caps capsule Commonly known as: RAPAFLO Take 1 capsule (8 mg total) by mouth at bedtime.   vitamin C 250 MG tablet Commonly known as: ASCORBIC ACID Take 250 mg by mouth daily.        Allergies:  Allergies  Allergen Reactions   Lisinopril Other (See Comments)    Pt unsure    Metformin Diarrhea   Enoxaparin Rash    Other reaction(s): Eruption of skin   Lovenox [Enoxaparin Sodium] Hives and Rash   Penicillins Rash     Has patient had a PCN reaction causing immediate rash, facial/tongue/throat swelling, SOB or lightheadedness with hypotension: Yes Has patient had a PCN reaction causing severe rash involving mucus membranes or skin necrosis: No Has patient had a PCN reaction that required hospitalization No Has patient had a PCN reaction occurring within the last 10 years: No If all of the above answers are "NO", then may proceed with Cephalosporin use.     Family History: Family History  Problem Relation Age of Onset   Heart disease Mother    Suicidality Mother     Heart attack Father        30   Heart disease Father    Heart disease Sister    Prostate cancer Brother    Colon cancer Neg Hx  Breast cancer Neg Hx    Pancreatic cancer Neg Hx     Social History:  reports that he has never smoked. He has never used smokeless tobacco. He reports that he does not drink alcohol and does not use drugs.  ROS: All other review of systems were reviewed and are negative except what is noted above in HPI  Physical Exam: BP (!) 172/68   Pulse 62   Constitutional:  Alert and oriented, No acute distress. HEENT: [place] AT, moist mucus membranes.  Trachea midline, no masses. Cardiovascular: No clubbing, cyanosis, or edema. Respiratory: Normal respiratory effort, no increased work of breathing. GI: Abdomen is soft, nontender, nondistended, no abdominal masses GU: No CVA tenderness.  Lymph: No cervical or inguinal lymphadenopathy. Skin: No rashes, bruises or suspicious lesions. Neurologic: Grossly intact, no focal deficits, moving all 4 extremities. Psychiatric: Normal mood and affect.  Laboratory Data: Lab Results  Component Value Date   WBC 4.4 05/30/2020   HGB 12.5 (L) 05/30/2020   HCT 36.6 (L) 05/30/2020   MCV 97.1 05/30/2020   PLT 170 05/30/2020    Lab Results  Component Value Date   CREATININE 1.29 (H) 05/30/2020    Lab Results  Component Value Date   PSA 12.0 (H) 11/22/2019    No results found for: "TESTOSTERONE"  No results found for: "HGBA1C"  Urinalysis    Component Value Date/Time   COLORURINE YELLOW 07/12/2018 1158   APPEARANCEUR Clear 04/18/2021 1006   LABSPEC 1.012 07/12/2018 1158   PHURINE 5.0 07/12/2018 1158   GLUCOSEU Negative 04/18/2021 1006   HGBUR NEGATIVE 07/12/2018 1158   BILIRUBINUR Negative 04/18/2021 1006   KETONESUR NEGATIVE 07/12/2018 1158   PROTEINUR 2+ (A) 04/18/2021 1006   PROTEINUR NEGATIVE 07/12/2018 1158   UROBILINOGEN 0.2 03/29/2010 0803   NITRITE Negative 04/18/2021 1006   NITRITE NEGATIVE  07/12/2018 1158   LEUKOCYTESUR Negative 04/18/2021 1006   LEUKOCYTESUR NEGATIVE 07/12/2018 1158    Lab Results  Component Value Date   LABMICR See below: 04/18/2021   WBCUA None seen 04/18/2021   LABEPIT 0-10 04/18/2021   MUCUS Present 04/18/2021   BACTERIA Few 04/18/2021    Pertinent Imaging:  No results found for this or any previous visit.  No results found for this or any previous visit.  No results found for this or any previous visit.  No results found for this or any previous visit.  No results found for this or any previous visit.  No results found for this or any previous visit.  No results found for this or any previous visit.  No results found for this or any previous visit.   Assessment & Plan:    1. Prostate cancer ([place]) -RCT  6 months with PSA - Urinalysis, Routine w reflex microscopic  2. Nocturia -Continue rapaflo '8mg'$  daily -start mirabegron '25mg'$  daily  3. Benign prostatic hyperplasia with urinary obstruction -Continue rapaflo '8mg'$  daily  4. Erectile dysfunction due to arterial insufficiency -sildenafil '100mg'$  prnb   No follow-ups on file.  Nicolette Bang, MD  [facility] [place]

## 2021-11-01 DIAGNOSIS — G4733 Obstructive sleep apnea (adult) (pediatric): Secondary | ICD-10-CM | POA: Diagnosis not present

## 2021-11-01 DIAGNOSIS — G4700 Insomnia, unspecified: Secondary | ICD-10-CM | POA: Diagnosis not present

## 2021-11-27 DIAGNOSIS — C49A4 Gastrointestinal stromal tumor of large intestine: Secondary | ICD-10-CM | POA: Diagnosis not present

## 2021-12-06 DIAGNOSIS — G4733 Obstructive sleep apnea (adult) (pediatric): Secondary | ICD-10-CM | POA: Diagnosis not present

## 2022-01-21 DIAGNOSIS — K023 Arrested dental caries: Secondary | ICD-10-CM | POA: Diagnosis not present

## 2022-01-21 DIAGNOSIS — K06020 Generalized gingival recession, unspecified: Secondary | ICD-10-CM | POA: Diagnosis not present

## 2022-01-21 DIAGNOSIS — K0510 Chronic gingivitis, plaque induced: Secondary | ICD-10-CM | POA: Diagnosis not present

## 2022-01-21 DIAGNOSIS — K036 Deposits [accretions] on teeth: Secondary | ICD-10-CM | POA: Diagnosis not present

## 2022-03-01 DIAGNOSIS — H401131 Primary open-angle glaucoma, bilateral, mild stage: Secondary | ICD-10-CM | POA: Diagnosis not present

## 2022-04-04 ENCOUNTER — Other Ambulatory Visit: Payer: Self-pay | Admitting: Gastroenterology

## 2022-04-04 DIAGNOSIS — R1013 Epigastric pain: Secondary | ICD-10-CM

## 2022-04-04 DIAGNOSIS — K219 Gastro-esophageal reflux disease without esophagitis: Secondary | ICD-10-CM

## 2022-04-16 ENCOUNTER — Other Ambulatory Visit: Payer: Medicare PPO

## 2022-04-16 DIAGNOSIS — C61 Malignant neoplasm of prostate: Secondary | ICD-10-CM

## 2022-04-17 LAB — PSA: Prostate Specific Ag, Serum: 0.3 ng/mL (ref 0.0–4.0)

## 2022-04-23 ENCOUNTER — Encounter: Payer: Self-pay | Admitting: Urology

## 2022-04-23 ENCOUNTER — Ambulatory Visit (INDEPENDENT_AMBULATORY_CARE_PROVIDER_SITE_OTHER): Payer: Medicare PPO | Admitting: Urology

## 2022-04-23 VITALS — BP 134/67 | HR 64 | Ht 69.0 in | Wt 170.0 lb

## 2022-04-23 DIAGNOSIS — N401 Enlarged prostate with lower urinary tract symptoms: Secondary | ICD-10-CM

## 2022-04-23 DIAGNOSIS — N5201 Erectile dysfunction due to arterial insufficiency: Secondary | ICD-10-CM

## 2022-04-23 DIAGNOSIS — R351 Nocturia: Secondary | ICD-10-CM | POA: Diagnosis not present

## 2022-04-23 DIAGNOSIS — C61 Malignant neoplasm of prostate: Secondary | ICD-10-CM | POA: Diagnosis not present

## 2022-04-23 DIAGNOSIS — N138 Other obstructive and reflux uropathy: Secondary | ICD-10-CM

## 2022-04-23 MED ORDER — MIRABEGRON ER 25 MG PO TB24: 25.0000 mg | ORAL_TABLET | Freq: Every day | ORAL | 11 refills | Status: DC

## 2022-04-23 MED ORDER — SILDENAFIL CITRATE 100 MG PO TABS: 100.0000 mg | ORAL_TABLET | ORAL | 5 refills | Status: DC | PRN

## 2022-04-23 MED ORDER — SILODOSIN 8 MG PO CAPS: 8.0000 mg | ORAL_CAPSULE | Freq: Every day | ORAL | 11 refills | Status: DC

## 2022-04-23 NOTE — Progress Notes (Signed)
04/23/2022 2:33 PM   Donald Klein Dec 25, 1945 [id number]  Referring provider: [name], MD [address] [place],   [address]  [place] Prostate cancer and BPH   HPI: Donald Klein is a 76yo here for followup for erectile dysfunction, BPH and prostate cancer. PSA decreased to 0.3. IPSS 12 QOL 2 on rapaflo '8mg'$ . He uses sildenafil '100mg'$  prn with good results   PMH: Past Medical History:  Diagnosis Date   Anemia    Anxiety    Barrett esophagus    Last EGD 05/27/08 Barrett's without dyplasia. EGD due 05/2011   Chronic back pain    Colitis    In 1980s, dx with UC by Dr. 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   Perceptive hearing loss, both sides    weras hearing aides both ears   Prostate cancer ([place])    PTSD (post-traumatic stress disorder)    PTSD (post-traumatic stress disorder)    Pulmonary nodules chest ct 08-25-2019   stable per chest ct   Skin abnormality 05/31/2020   sore on right shin x 1 week no drainage healing wife putting neosporin on daily   Sleep apnea    no cpap did not tolerate cpap    Small bowel tumor    presented with SBO (distal-near TI), path showed spindle cell neoplasm 6.5cm   Tubular adenoma of colon 10/08/2010   Last colonosocpy 1 cecal TA   Wears glasses     Surgical History: Past Surgical History:  Procedure Laterality Date   APPENDECTOMY  July 2014   at time of small bowel resection   BIOPSY  01/06/2018   Procedure: BIOPSY;  Surgeon: Daneil Dolin, MD;  [facility];  Service: Endoscopy;;  gastric biopsy , esophageal biopsy   BIOPSY  10/29/2018   Procedure: BIOPSY;  Surgeon: Daneil Dolin, MD;  [facility];  Service: Endoscopy;;  gastric, esophageal   CARDIAC CATHETERIZATION  2001   saw cardiology for 1 year and released   CHOLECYSTECTOMY  1980's or 1990's   COLONOSCOPY  10/08/2010   Normal rectum/tubular adenoma/normal random biopsy. Next TCS due 10/2015.   COLONOSCOPY N/A 09/30/2014   Dr. Gala Romney: Redunant colon . Status post segmental biopsy and stool sampling. Negative stool samples and colonic biopsies.    COLONOSCOPY WITH PROPOFOL N/A 10/29/2018   Surgeon: Daneil Dolin, MD; markedly redundant colon with benign-appearing noncritical colonic stricture, inability to identify landmarks of cecum and ileocecal valve.  Apparently patient had virtual colonoscopy thereafter.  Dr. Gala Romney did not recommend repeating colonoscopy.   ESOPHAGOGASTRODUODENOSCOPY  07/31/2011   Dr. Doyce Para segment Barrett's esophagus s/p bx Hiatal hernia. Duodenal diverticulum. No dysplasia.Next EGD 07/2014.   ESOPHAGOGASTRODUODENOSCOPY N/A 09/30/2014   Dr. Gala Romney: Abnormal distal esophagus consistant with prior diagnosis of short segment Barrett's esophagus status post biopsy. Noncritical Schatizki's ring not  maipulated. Hiatal hernia. Abnormal Gastric mucosa of uncertain significance status post gastric biopsy. path with +Barrett's esophagus but no dysplasia, mild chronic gastritis. Surveillance for Barrett's due in 2019   ESOPHAGOGASTRODUODENOSCOPY N/A 01/06/2018   Procedure: ESOPHAGOGASTRODUODENOSCOPY (EGD);  Surgeon: Daneil Dolin, MD;  [facility];  Service: Endoscopy;  Laterality: N/A;  12:00pm   ESOPHAGOGASTRODUODENOSCOPY (EGD) WITH PROPOFOL N/A 10/29/2018    Surgeon: Daneil Dolin, MD;  appearance of Barrett's esophagus though biopsy with reactive/regenerative changes, moderate gastric mucosa with some deformity  likely related to prior surgery s/p biopsy (chronic gastritis), normal examined duodenum.   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)   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 on 07/2015    PROSTATE BIOPSY  2021   RADIOACTIVE SEED IMPLANT N/A 06/02/2020   Procedure: RADIOACTIVE SEED IMPLANT/BRACHYTHERAPY IMPLANT;  Surgeon: [name], MD;  Location: [facility];  Service: Urology;  Laterality: N/A;   SPACE OAR INSTILLATION N/A 06/02/2020   Procedure: SPACE OAR INSTILLATION;  Surgeon: [name], MD;  Location[facility];  Service: Urology;  Laterality: N/A;    Home Medications:  Allergies as of 04/23/2022       Reactions   Lisinopril Other (See Comments)   Pt unsure    Metformin Diarrhea   Enoxaparin Rash   Other reaction(s): Eruption of skin   Lovenox [enoxaparin Sodium] Hives, Rash   Penicillins Rash    Has patient had a PCN reaction causing immediate rash, facial/tongue/throat swelling, SOB or lightheadedness with hypotension: Yes Has patient had a PCN reaction causing severe rash involving mucus membranes or skin necrosis: No Has patient had a PCN reaction that required hospitalization No Has patient had a PCN reaction occurring within the last 10 years: No If all of the above answers are "NO", then may proceed with Cephalosporin use.        Medication List        Accurate as of April 23, 2022  2:33 PM. If you have any questions, ask your nurse or doctor.          alfuzosin 10 MG 24 hr tablet Commonly known as: UROXATRAL TAKE 1 TABLET(10 MG) BY MOUTH DAILY WITH BREAKFAST    alfuzosin 10 MG 24 hr tablet Commonly known as: UROXATRAL TAKE 1 TABLET(10 MG) BY MOUTH DAILY WITH BREAKFAST   aspirin EC 81 MG tablet   CALCIUM PO Take 1 tablet by mouth 2 (two) times daily.   Cholecalciferol 25 MCG (1000 UT) tablet Take by mouth daily.   diazepam 5 MG tablet Commonly known as: VALIUM Take 5 mg by mouth daily as needed for anxiety.   DRY EYES OP Place 1 drop into both eyes 2 (two) times daily as needed (dry eyes).   esomeprazole 20 MG capsule Commonly known as: NEXIUM TAKE 1 CAPSULE(20 MG) BY MOUTH TWICE DAILY BEFORE A MEAL   ferrous sulfate 325 (65 FE) MG tablet Take 325 mg by mouth. Monday Wednesday Friday   Fish Oil 1000 MG Caps Take by mouth in the morning and at bedtime.   gabapentin 400 MG capsule Commonly known as: NEURONTIN Take 400 mg by mouth at  bedtime.   GARLIC PO Take by mouth daily.   hydrocortisone 2.5 % rectal cream Commonly known as: ANUSOL-HC Place 1 application rectally 2 (two) times daily. For up to 10 days at a time   imatinib 400 MG tablet Commonly known as: GLEEVEC Take 1 tablet by mouth at bedtime.   Januvia 100 MG tablet Generic drug: sitaGLIPtin Take 100 mg by mouth daily.   latanoprost 0.005 % ophthalmic solution Commonly known as: XALATAN SMARTSIG:In Eye(s)   loperamide 2 MG capsule Commonly known as: IMODIUM Take by mouth as needed for diarrhea or loose stools.   losartan 100 MG tablet Commonly known as: COZAAR Take 100 mg by mouth daily.   Lovaza 1 g capsule Generic drug: omega-3 acid ethyl esters Take 1 capsule by mouth 2 (two) times daily.   Magnesium 400 MG Caps Take by mouth daily.   metoprolol tartrate 50 MG tablet Commonly known as: LOPRESSOR Take 50-75 mg by mouth 2 (two) times daily. '50mg'$  in am and '75mg'$  at night   mirabegron ER 25 MG Tb24 tablet Commonly known as: MYRBETRIQ Take 1 tablet (25 mg total) by mouth daily.   multivitamin with minerals tablet Take 1 tablet by mouth daily.  Centrum adult 50 plus   neomycin-bacitracin-polymyxin [id number] ointment Commonly known as: NEOSPORIN Apply 1 application topically every 12 (twelve) hours. To right shin daily   nitroGLYCERIN 0.4 MG SL tablet Commonly known as: NITROSTAT Place 0.4 mg under the tongue every 5 (five) minutes as needed for chest pain.   NON FORMULARY Eye drop lantaprost 125 mg 1 drop both eyes at hs   QUEtiapine 50 MG tablet Commonly known as: SEROQUEL Take 25 mg by mouth at bedtime as needed for depression. Ordered to take 1 and 1/2 tabs prn qhs Taking 25 mg prn qhs prn   rosuvastatin 10 MG tablet Commonly known as: CRESTOR Take 10 mg by mouth daily.   sertraline 100 MG tablet Commonly known as: ZOLOFT Take 100 mg by mouth daily as needed (PTSD).   sildenafil 100 MG tablet Commonly known as: VIAGRA TAKE 1 TABLET BY MOUTH AS NEEDED FOR ERECTILE DYSFUNCTION   silodosin 8 MG Caps capsule Commonly known as: RAPAFLO Take 1 capsule (8 mg total) by mouth at bedtime.   vitamin C 250 MG tablet Commonly known as: ASCORBIC ACID Take 250 mg by mouth daily.        Allergies:  Allergies  Allergen Reactions   Lisinopril Other (See Comments)    Pt unsure    Metformin Diarrhea   Enoxaparin Rash    Other reaction(s): Eruption of skin   Lovenox [Enoxaparin Sodium] Hives and Rash   Penicillins Rash     Has patient had a PCN reaction causing immediate rash, facial/tongue/throat swelling, SOB or lightheadedness with hypotension: Yes Has patient had a PCN reaction causing severe rash involving mucus membranes or skin necrosis: No Has patient had a PCN reaction that required hospitalization No Has patient had a PCN reaction occurring within the last 10 years: No If all of the above answers are "NO", then may proceed with Cephalosporin use.     Family History: Family History  Problem Relation Age of Onset   Heart disease Mother    Suicidality Mother    Heart attack Father        21   Heart  disease Father    Heart disease Sister    Prostate cancer Brother    Colon cancer Neg Hx    Breast cancer  Neg Hx    Pancreatic cancer Neg Hx     Social History:  reports that he has never smoked. He has never used smokeless tobacco. He reports that he does not drink alcohol and does not use drugs.  ROS: All other review of systems were reviewed and are negative except what is noted above in HPI  Physical Exam: BP 134/67   Pulse 64   Ht '5\' 9"'$  (1.753 m)   Wt 170 lb (77.1 kg)   BMI 25.10 kg/m   Constitutional:  Alert and oriented, No acute distress. HEENT:  AT, moist mucus membranes.  Trachea midline, no masses. Cardiovascular: No clubbing, cyanosis, or edema. Respiratory: Normal respiratory effort, no increased work of breathing. GI: Abdomen is soft, nontender, nondistended, no abdominal masses GU: No CVA tenderness.  Lymph: No cervical or inguinal lymphadenopathy. Skin: No rashes, bruises or suspicious lesions. Neurologic: Grossly intact, no focal deficits, moving all 4 extremities. Psychiatric: Normal mood and affect.  Laboratory Data: Lab Results  Component Value Date   WBC 4.4 05/30/2020   HGB 12.5 (L) 05/30/2020   HCT 36.6 (L) 05/30/2020   MCV 97.1 05/30/2020   PLT 170 05/30/2020    Lab Results  Component Value Date   CREATININE 1.29 (H) 05/30/2020    Lab Results  Component Value Date   PSA 12.0 (H) 11/22/2019    No results found for: "TESTOSTERONE"  No results found for: "HGBA1C"  Urinalysis    Component Value Date/Time   COLORURINE YELLOW 07/12/2018 1158   APPEARANCEUR Clear 10/22/2021 0914   LABSPEC 1.012 07/12/2018 1158   PHURINE 5.0 07/12/2018 1158   GLUCOSEU Negative 10/22/2021 0914   HGBUR NEGATIVE 07/12/2018 1158   BILIRUBINUR Negative 10/22/2021 0914   KETONESUR NEGATIVE 07/12/2018 1158   PROTEINUR 3+ (A) 10/22/2021 0914   PROTEINUR NEGATIVE 07/12/2018 1158   UROBILINOGEN 0.2 03/29/2010 0803   NITRITE Negative 10/22/2021 0914    NITRITE NEGATIVE 07/12/2018 1158   LEUKOCYTESUR Negative 10/22/2021 0914   LEUKOCYTESUR NEGATIVE 07/12/2018 1158    Lab Results  Component Value Date   LABMICR See below: 10/22/2021   WBCUA 0-5 10/22/2021   LABEPIT 0-10 10/22/2021   MUCUS Present 04/18/2021   BACTERIA Trace (A) 10/22/2021    Pertinent Imaging:  No results found for this or any previous visit.  No results found for this or any previous visit.  No results found for this or any previous visit.  No results found for this or any previous visit.  No results found for this or any previous visit.  No valid procedures specified. No results found for this or any previous visit.  No results found for this or any previous visit.   Assessment & Plan:    1. Prostate cancer ([place]) -Followup 1 year with PSA - Urinalysis, Routine w reflex microscopic  2. Nocturia -rapaflo '8mg'$  daily  3. Benign prostatic hyperplasia with urinary obstruction -Rapaflo '8mg'$  daily  4. Erectile dysfunction due to arterial insufficiency -Continue sildenafil '100mg'$  prn   No follow-ups on file.  Nicolette Bang, MD  [facility] [place]

## 2022-04-23 NOTE — Patient Instructions (Signed)

## 2022-04-24 LAB — MICROSCOPIC EXAMINATION
Bacteria, UA: NONE SEEN
RBC, Urine: NONE SEEN /hpf (ref 0–2)
WBC, UA: NONE SEEN /hpf (ref 0–5)

## 2022-04-24 LAB — URINALYSIS, ROUTINE W REFLEX MICROSCOPIC
Bilirubin, UA: NEGATIVE
Glucose, UA: NEGATIVE
Ketones, UA: NEGATIVE
Leukocytes,UA: NEGATIVE
Nitrite, UA: NEGATIVE
RBC, UA: NEGATIVE
Specific Gravity, UA: 1.015 (ref 1.005–1.030)
Urobilinogen, Ur: 0.2 mg/dL (ref 0.2–1.0)
pH, UA: 5 (ref 5.0–7.5)

## 2022-05-13 DIAGNOSIS — M47817 Spondylosis without myelopathy or radiculopathy, lumbosacral region: Secondary | ICD-10-CM | POA: Diagnosis not present

## 2022-05-13 DIAGNOSIS — M5134 Other intervertebral disc degeneration, thoracic region: Secondary | ICD-10-CM | POA: Diagnosis not present

## 2022-05-13 DIAGNOSIS — M5416 Radiculopathy, lumbar region: Secondary | ICD-10-CM | POA: Diagnosis not present

## 2022-05-13 DIAGNOSIS — X500XXA Overexertion from strenuous movement or load, initial encounter: Secondary | ICD-10-CM | POA: Diagnosis not present

## 2022-05-13 DIAGNOSIS — I2584 Coronary atherosclerosis due to calcified coronary lesion: Secondary | ICD-10-CM | POA: Diagnosis not present

## 2022-05-13 DIAGNOSIS — Z978 Presence of other specified devices: Secondary | ICD-10-CM | POA: Diagnosis not present

## 2022-05-13 DIAGNOSIS — I10 Essential (primary) hypertension: Secondary | ICD-10-CM | POA: Diagnosis not present

## 2022-06-04 ENCOUNTER — Emergency Department (HOSPITAL_COMMUNITY)
Admission: EM | Admit: 2022-06-04 | Discharge: 2022-06-04 | Disposition: A | Discharge status: Home / Self Care | Payer: Medicare PPO | Attending: Emergency Medicine | Admitting: Emergency Medicine

## 2022-06-04 ENCOUNTER — Encounter (HOSPITAL_COMMUNITY): Payer: Self-pay | Admitting: *Deleted

## 2022-06-04 ENCOUNTER — Other Ambulatory Visit: Payer: Self-pay

## 2022-06-04 DIAGNOSIS — M549 Dorsalgia, unspecified: Secondary | ICD-10-CM | POA: Diagnosis not present

## 2022-06-04 DIAGNOSIS — M545 Low back pain, unspecified: Secondary | ICD-10-CM | POA: Diagnosis not present

## 2022-06-04 DIAGNOSIS — Z7982 Long term (current) use of aspirin: Secondary | ICD-10-CM | POA: Diagnosis not present

## 2022-06-04 DIAGNOSIS — Y92410 Unspecified street and highway as the place of occurrence of the external cause: Secondary | ICD-10-CM | POA: Diagnosis not present

## 2022-06-04 MED ORDER — METHOCARBAMOL 500 MG PO TABS: 250.0000 mg | ORAL_TABLET | Freq: Every evening | ORAL | 0 refills | Status: AC

## 2022-06-04 MED ORDER — LIDOCAINE 5 % EX PTCH: 1.0000 | MEDICATED_PATCH | CUTANEOUS | 0 refills | Status: DC
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
## 2022-06-13 DIAGNOSIS — G4733 Obstructive sleep apnea (adult) (pediatric): Secondary | ICD-10-CM | POA: Diagnosis not present

## 2022-06-13 DIAGNOSIS — Z91199 Patient's noncompliance with other medical treatment and regimen due to unspecified reason: Secondary | ICD-10-CM | POA: Diagnosis not present

## 2022-06-25 DIAGNOSIS — C49A4 Gastrointestinal stromal tumor of large intestine: Secondary | ICD-10-CM | POA: Diagnosis not present

## 2022-06-25 DIAGNOSIS — D509 Iron deficiency anemia, unspecified: Secondary | ICD-10-CM | POA: Diagnosis not present

## 2022-06-25 DIAGNOSIS — C49A2 Gastrointestinal stromal tumor of stomach: Secondary | ICD-10-CM | POA: Diagnosis not present

## 2022-06-28 DIAGNOSIS — M5416 Radiculopathy, lumbar region: Secondary | ICD-10-CM | POA: Diagnosis not present

## 2022-07-05 ENCOUNTER — Other Ambulatory Visit: Payer: Self-pay | Admitting: Urology

## 2022-07-05 DIAGNOSIS — N401 Enlarged prostate with lower urinary tract symptoms: Secondary | ICD-10-CM

## 2022-07-09 DIAGNOSIS — C49A4 Gastrointestinal stromal tumor of large intestine: Secondary | ICD-10-CM | POA: Diagnosis not present

## 2022-07-09 DIAGNOSIS — C49A2 Gastrointestinal stromal tumor of stomach: Secondary | ICD-10-CM | POA: Diagnosis not present

## 2022-09-03 DIAGNOSIS — C49A4 Gastrointestinal stromal tumor of large intestine: Secondary | ICD-10-CM | POA: Diagnosis not present

## 2022-09-10 DIAGNOSIS — M47816 Spondylosis without myelopathy or radiculopathy, lumbar region: Secondary | ICD-10-CM | POA: Diagnosis not present

## 2022-09-10 DIAGNOSIS — M5136 Other intervertebral disc degeneration, lumbar region: Secondary | ICD-10-CM | POA: Diagnosis not present

## 2022-09-10 DIAGNOSIS — M48061 Spinal stenosis, lumbar region without neurogenic claudication: Secondary | ICD-10-CM | POA: Diagnosis not present

## 2022-10-04 DIAGNOSIS — M5416 Radiculopathy, lumbar region: Secondary | ICD-10-CM | POA: Diagnosis not present

## 2022-10-04 DIAGNOSIS — M5136 Other intervertebral disc degeneration, lumbar region: Secondary | ICD-10-CM | POA: Diagnosis not present

## 2022-10-08 DIAGNOSIS — M7752 Other enthesopathy of left foot: Secondary | ICD-10-CM | POA: Diagnosis not present

## 2022-10-08 DIAGNOSIS — M25775 Osteophyte, left foot: Secondary | ICD-10-CM | POA: Diagnosis not present

## 2022-10-08 DIAGNOSIS — M7732 Calcaneal spur, left foot: Secondary | ICD-10-CM | POA: Diagnosis not present

## 2022-10-09 DIAGNOSIS — S90422A Blister (nonthermal), left great toe, initial encounter: Secondary | ICD-10-CM | POA: Diagnosis not present

## 2022-10-09 DIAGNOSIS — S90112A Contusion of left great toe without damage to nail, initial encounter: Secondary | ICD-10-CM | POA: Diagnosis not present

## 2022-10-29 DIAGNOSIS — C49A4 Gastrointestinal stromal tumor of large intestine: Secondary | ICD-10-CM | POA: Diagnosis not present

## 2022-11-11 DIAGNOSIS — L603 Nail dystrophy: Secondary | ICD-10-CM | POA: Diagnosis not present

## 2022-11-17 ENCOUNTER — Other Ambulatory Visit: Payer: Self-pay | Admitting: Gastroenterology

## 2022-11-17 DIAGNOSIS — K219 Gastro-esophageal reflux disease without esophagitis: Secondary | ICD-10-CM

## 2022-11-17 DIAGNOSIS — R1013 Epigastric pain: Secondary | ICD-10-CM

## 2022-11-18 NOTE — Telephone Encounter (Signed)
I will send in limited refill. He will need an OV for additional refills as he hasn't been seen since 2022.

## 2022-11-19 NOTE — Telephone Encounter (Signed)
Pt was scheduled an appt with Kristen on 11/21/2022

## 2022-11-19 NOTE — Progress Notes (Signed)
Referring Provider: Pearson Grippe, MD Primary Care Physician:  Pearson Grippe, MD Primary GI Physician: Dr. Jena Gauss  Chief Complaint  Patient presents with   medication refills     No problems     HPI:   Donald Klein is a 77 y.o. male presenting today for follow-up of GERD with need for medication refills.   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  Last EGD and colonoscopy June 2020.  Colonoscopy with markedly redundant colon and benign appearing noncritical colonic stricture, inability to identify landmarks of cecum and ileocecal valve.  EGD with appearance of Barrett's esophagus the biopsy with reactive/regenerative changes, moderate gastric gastric mucosa with some deformity likely related to prior surgery biopsied (chronic gastritis).  Virtual colonoscopy August 2020 normal.  No recommendations to repeat colonoscopy or EGD.  Last seen in our office 12/11/2020.  GERD well controlled on Nexium twice daily.  Occasionally taking OTC antacid for breakthrough symptoms, particularly if eating late.  Continued with 7-8 BMs daily, some nocturnal stools, and intermittent abdominal cramping prior to bowel movements that improved thereafter.  He was taking 2 Imodium if going out.  Reported Metamucil worsened to diarrhea.  No BRBPR or melena.  Reviewed outside CT imaging which showed increased size of pancreatic lesion to 9 mm from 5 mm previously.  He was asymptomatic to this.  Plan to update stool studies, check TSH, screen for celiac disease, continue Nexium twice daily, lactose-free and low-fat diet, and discuss possibility of repeat MRI versus EUS for pancreatic lesion evaluation with Dr. Jena Gauss.    Case was discussed with Dr. Jena Gauss who recommended discussing with GI providers in [place].  Discussed with Dr. Christella Hartigan who reviewed patient's chart and stated there were no concerning signs on his most recent CT that would warrant sampling of this lesion.  He recommended MRI   MRI/MRCP 01/03/2021 revealing 8 mm likely IPMN.  Also redemonstrated known GIST tumor between the stomach and transverse colon.  Recommend repeat MRI in 2 years. He was placed on recall.    TSH within normal limits.  Celiac screen negative.  GI profile panel negative.     Today:  GERD:  Does well with exam twice a day.  If he does not take the second dose, he tends to have some breakthrough reflux symptoms.  Denies nausea, vomiting, abdominal pain, or dysphagia.  Diarrhea: Continues with chronic diarrhea.  Reports it somewhat better than it used to be.  Can have anywhere from 3-7 bowel movements a day.  Can be mushy or sometimes watery.  States fish oil and magnesium do worsen his diarrhea.  Gleevec also seems to worsen diarrhea.  Thinks Nexium may also be contributing.  Also notes that hot tea or coffee will cause him to have more diarrhea.  Fatty foods does not seem to affect him so much.  He does tend to eat a lot of vegetables.  Only takes Imodium if he is going somewhere and this does not stop his diarrhea.  Usually only has to take 1 pill, sometimes 2 pills.  No BRBPR or melena.     Past Medical History:  Diagnosis Date   Anemia    Anxiety    Barrett esophagus    Last EGD  05/27/08 Barrett's without dyplasia. EGD due 05/2011   Chronic back pain    Colitis    In 1980s, dx with UC by Dr. Welton Flakes at time of TCS, 1992 TCS, chronic colitis   Depression    DM type 2 (diabetes mellitus, type 2) (HCC)    Fatty liver     small spots on liver also   Fibromyalgia    GERD (gastroesophageal reflux disease)    GIST, malignant (HCC)    Recurrent, on Gleevec, followed by [facility]   Helicobacter pylori gastritis 2009   treated    History of hiatal hernia    HTN (hypertension)    Hydrocele    Hyperlipidemia    IBS (irritable bowel syndrome)    Malignant stromal tumor of stomach (HCC)    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   Perceptive hearing loss, both sides    weras hearing aides both ears   Prostate cancer (HCC)    PTSD (post-traumatic stress disorder)    PTSD (post-traumatic stress disorder)    Pulmonary nodules chest ct 08-25-2019   stable per chest ct   Skin abnormality 05/31/2020   sore on right shin x 1 week no drainage healing wife putting neosporin on daily   Sleep apnea    no cpap did not tolerate cpap    Small bowel tumor    presented with SBO (distal-near TI), path showed spindle cell neoplasm 6.5cm   Tubular adenoma of colon 10/08/2010   Last colonosocpy 1 cecal TA   Wears glasses     Past Surgical History:  Procedure Laterality Date   APPENDECTOMY  July 2014   at time of small bowel resection   BIOPSY  01/06/2018   Procedure: BIOPSY;  Surgeon: Corbin Ade, MD;  [facility]: [facility];  Service: Endoscopy;;  gastric biopsy , esophageal biopsy   BIOPSY  10/29/2018   Procedure: BIOPSY;  Surgeon: Corbin Ade, MD;  Location: [facility];  Service: Endoscopy;;  gastric, esophageal   CARDIAC CATHETERIZATION  2001   saw cardiology for 1 year and released   CHOLECYSTECTOMY  1980's or 1990's   COLONOSCOPY  10/08/2010   Normal rectum/tubular adenoma/normal random biopsy. Next TCS due 10/2015.   COLONOSCOPY N/A 09/30/2014   Dr. Jena Gauss: Redunant colon . Status post segmental biopsy and stool sampling. Negative stool samples and colonic biopsies.    COLONOSCOPY WITH PROPOFOL N/A 10/29/2018   Surgeon: Corbin Ade, MD; markedly redundant colon with benign-appearing noncritical colonic stricture, inability to identify landmarks of cecum and ileocecal valve.  Apparently patient had virtual colonoscopy thereafter.  Dr. Jena Gauss did not recommend repeating colonoscopy.   ESOPHAGOGASTRODUODENOSCOPY   07/31/2011   Dr. Jens Som segment Barrett's esophagus s/p bx Hiatal hernia. Duodenal diverticulum. No dysplasia.Next EGD 07/2014.   ESOPHAGOGASTRODUODENOSCOPY N/A 09/30/2014   Dr. Jena Gauss: Abnormal distal esophagus consistant with prior diagnosis of short segment Barrett's esophagus status post biopsy. 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 Hiatal hernia. Abnormal Gastric mucosa of uncertain significance status post gastric biopsy. path with +Barrett's esophagus but no dysplasia, mild chronic gastritis. Surveillance for Barrett's due in 2019   ESOPHAGOGASTRODUODENOSCOPY N/A 01/06/2018   Procedure: ESOPHAGOGASTRODUODENOSCOPY (EGD);  Surgeon: Corbin Ade, MD;  Location: [facility];  Service: Endoscopy;  Laterality: N/A;  12:00pm   ESOPHAGOGASTRODUODENOSCOPY (EGD) WITH PROPOFOL N/A 10/29/2018    Surgeon: Corbin Ade, MD;  appearance of Barrett's esophagus though biopsy with reactive/regenerative changes, moderate gastric mucosa with some deformity likely related to prior surgery  s/p biopsy (chronic gastritis), normal examined duodenum.   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)   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 on 07/2015    PROSTATE BIOPSY  2021   RADIOACTIVE SEED IMPLANT N/A 06/02/2020   Procedure: RADIOACTIVE SEED IMPLANT/BRACHYTHERAPY IMPLANT;  Surgeon: Malen Gauze, MD;  Location: [facility];  Service: Urology;  Laterality: N/A;   SPACE OAR INSTILLATION N/A 06/02/2020   Procedure: SPACE OAR INSTILLATION;  Surgeon: Malen Gauze, MD;  Location: [facility];  Service: Urology;  Laterality: N/A;     Current Outpatient Medications  Medication Sig Dispense Refill   alfuzosin (UROXATRAL) 10 MG 24 hr tablet TAKE 1 TABLET(10 MG) BY MOUTH DAILY WITH BREAKFAST 30 tablet 11   alfuzosin (UROXATRAL) 10 MG 24 hr tablet TAKE 1 TABLET(10 MG) BY MOUTH DAILY WITH BREAKFAST 30 tablet 11   amLODipine (NORVASC) 5 MG tablet TAKE ONE TABLET BY MOUTH TWICE A DAY FOR BLOOD PRESSURE     Artificial Tear Ointment (DRY EYES OP) Place 1 drop into both eyes 2 (two) times daily as needed (dry eyes).      aspirin 81 MG EC tablet      CALCIUM PO Take 1 tablet by mouth 2 (two) times daily.     Cholecalciferol 25 MCG (1000 UT) tablet Take by mouth daily.     diazepam (VALIUM) 5 MG tablet Take 5 mg by mouth daily as needed for anxiety.     ferrous sulfate 325 (65 FE) MG tablet Take 325 mg by mouth. Monday Wednesday Friday     gabapentin (NEURONTIN) 400 MG capsule Take 400 mg by mouth at bedtime.     GARLIC PO Take by mouth daily.     hydrocortisone (ANUSOL-HC) 2.5 % rectal cream Place 1 application rectally 2 (two) times daily. For up to 10 days at a time 30 g 2   JANUVIA 100 MG tablet Take 100 mg by mouth daily.     latanoprost (XALATAN) 0.005 % ophthalmic solution SMARTSIG:In Eye(s)     loperamide (IMODIUM) 2 MG capsule Take by mouth as needed for diarrhea or loose stools.     losartan (COZAAR) 100 MG tablet Take 100 mg by mouth daily.     metoprolol (LOPRESSOR) 50 MG tablet Take 50-75 mg by mouth 2 (two) times daily. 50mg  in am and 75mg  at night     neomycin-bacitracin-polymyxin (NEOSPORIN) ointment Apply 1 application topically every 12 (twelve) hours. To right shin daily     nitroGLYCERIN (NITROSTAT) 0.4 MG SL tablet Place 0.4 mg under the tongue every 5 (five) minutes as needed for chest pain.     NON FORMULARY Eye drop lantaprost 125 mg 1 drop both eyes at hs     Omega-3 Fatty Acids (FISH OIL) 1000 MG CAPS Take by mouth in the morning and at bedtime.     QUEtiapine (SEROQUEL) 50 MG tablet Take 25 mg by mouth  at bedtime as needed for depression. Ordered to take 1 and 1/2 tabs prn qhs Taking 25 mg prn qhs prn     RABEprazole (ACIPHEX) 20 MG tablet Take 1 tablet (20 mg total) by mouth daily. 30  tablet 3   rosuvastatin (CRESTOR) 10 MG tablet Take 10 mg by mouth daily.      sertraline (ZOLOFT) 100 MG tablet Take 100 mg by mouth daily as needed (PTSD).     sildenafil (VIAGRA) 100 MG tablet Take 1 tablet (100 mg total) by mouth as needed for erectile dysfunction. 10 tablet 5   silodosin (RAPAFLO) 8 MG CAPS capsule TAKE 1 CAPSULE(8 MG) BY MOUTH AT BEDTIME 30 capsule 11   vitamin C (ASCORBIC ACID) 250 MG tablet Take 250 mg by mouth daily.     imatinib (GLEEVEC) 400 MG tablet Take 1 tablet by mouth at bedtime.     lidocaine (LIDODERM) 5 % Place 1 patch onto the skin daily. Remove & Discard patch within 12 hours or as directed by MD (Patient not taking: Reported on 11/21/2022) 30 patch 0   LOVAZA 1 G capsule Take 1 capsule by mouth 2 (two) times daily.      Magnesium 400 MG CAPS Take by mouth daily.     mirabegron ER (MYRBETRIQ) 25 MG TB24 tablet Take 1 tablet (25 mg total) by mouth daily. 30 tablet 11   Multiple Vitamins-Minerals (MULTIVITAMIN WITH MINERALS) tablet Take 1 tablet by mouth daily. Centrum adult 50 plus     No current facility-administered medications for this visit.    Allergies as of 11/21/2022 - Review Complete 11/21/2022  Allergen Reaction Noted   Lisinopril Other (See Comments) 12/29/2017   Metformin Diarrhea 03/05/2019   Enoxaparin Rash 01/01/2013   Lovenox [enoxaparin sodium] Hives and Rash 09/30/2014   Penicillins Rash     Family History  Problem Relation Age of Onset   Heart disease Mother    Suicidality Mother    Heart attack Father        59   Heart disease Father    Heart disease Sister    Prostate cancer Brother    Colon cancer Neg Hx    Breast cancer Neg Hx    Pancreatic cancer Neg Hx     Social History   Socioeconomic History   Marital status: Married     Spouse name: Not on file   Number of children: 1   Years of education: Not on file   Highest education level: Not on file  Occupational History   Occupation: disability  Tobacco Use   Smoking status: Never   Smokeless tobacco: Never  Vaping Use   Vaping status: Never Used  Substance and Sexual Activity   Alcohol use: No    Alcohol/week: 0.0 standard drinks of alcohol   Drug use: No   Sexual activity: Yes    Partners: Female    Birth control/protection: None    Comment: spouse  Other Topics Concern   Not on file  Social History Narrative   Not on file   Social Determinants of Health   Financial Resource Strain: Not on file  Food Insecurity: Not on file  Transportation Needs: Not on file  Physical Activity: Not on file  Stress: Not on file  Social Connections: Not on file    Review of Systems: Gen: Denies fever, chills, cold or flulike symptoms, presyncope, syncope. CV: Denies chest pain, palpitations. Resp: Denies dyspnea, cough.  GI: See HPI. Heme: See HPI  Physical Exam: BP 122/60 (BP Location: Right Arm, Patient Position: Sitting, Cuff Size: Normal)   Pulse 65   Temp 97.7 F (36.5 C) (Temporal)   Ht 5\' 9"  (1.753 m)   Wt 175 lb 9.6 oz (79.7 kg)  SpO2 98%   BMI 25.93 kg/m  General:   Alert and oriented. No distress noted. Pleasant and cooperative.  Head:  Normocephalic and atraumatic. Eyes:  Conjuctiva clear without scleral icterus. Abdomen:  +BS, soft, non-tender and non-distended. No rebound or guarding. No HSM or masses noted. Msk:  Symmetrical without gross deformities. Normal posture. Extremities:  Without edema. Neurologic:  Alert and  oriented x4 Psych:  Normal mood and affect.    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 He is presenting today for follow-up.   GERD:  Well-controlled on Nexium 20 mg twice daily, but patient queries whether Nexium is contributing to his diarrhea.  No alarm symptoms.  We will try him on Aciphex 20 mg daily to see if this makes any difference for him in regards to diarrhea, but continues to keep reflux controlled.  Chronic Diarrhea: Dating back to the 32s.  Likely influenced by bile salt diarrhea s/p cholecystectomy, dietary intolerances, med effect with magnesium, Gleevec, Januvia, and possibly PPI.  Patient reports diarrhea is somewhat improved compared to prior symptoms.  Can still have anywhere from 3-7 bowel movements a day.  However, he responds very well to Imodium which he uses only when going out.  He can take 1 or occasionally 2 Imodium which will tend to stop his diarrhea for the day.  He has not had any blood in the stools, black stools, or unintentional weight loss.  Colonoscopy is up-to-date in 2020.  GI pathogen panel, TSH, celiac screen previously negative. He is satisfied with his current regimen for symptom control.   Pancreatic lesion: Likely IPMN.  Last MRI/MRCP August 2022 with 8 mm likely IPMN.  Recommended 2-year surveillance.  He is in recall.   Plan:  Start Aciphex 20 mg daily in place of Nexium. Patient will let me know if Aciphex isn't working well,.  Continue imodium as needed for diarrhea. Can take daily if needed. Max 4 pills per day.  On recall for MRI/MRCP August 2024. Follow-up in 3 months or sooner if needed.    Ermalinda Memos, PA-C [facility] 11/21/2022

## 2022-11-20 DIAGNOSIS — H25013 Cortical age-related cataract, bilateral: Secondary | ICD-10-CM | POA: Diagnosis not present

## 2022-11-20 DIAGNOSIS — E11319 Type 2 diabetes mellitus with unspecified diabetic retinopathy without macular edema: Secondary | ICD-10-CM | POA: Diagnosis not present

## 2022-11-20 DIAGNOSIS — H401131 Primary open-angle glaucoma, bilateral, mild stage: Secondary | ICD-10-CM | POA: Diagnosis not present

## 2022-11-20 DIAGNOSIS — H5200 Hypermetropia, unspecified eye: Secondary | ICD-10-CM | POA: Diagnosis not present

## 2022-11-21 ENCOUNTER — Encounter: Payer: Self-pay | Admitting: Gastroenterology

## 2022-11-21 ENCOUNTER — Ambulatory Visit (INDEPENDENT_AMBULATORY_CARE_PROVIDER_SITE_OTHER): Payer: Medicare PPO | Admitting: Gastroenterology

## 2022-11-21 VITALS — BP 122/60 | HR 65 | Temp 97.7°F | Ht 69.0 in | Wt 175.6 lb

## 2022-11-21 DIAGNOSIS — K869 Disease of pancreas, unspecified: Secondary | ICD-10-CM

## 2022-11-21 DIAGNOSIS — K219 Gastro-esophageal reflux disease without esophagitis: Secondary | ICD-10-CM

## 2022-11-21 DIAGNOSIS — K529 Noninfective gastroenteritis and colitis, unspecified: Secondary | ICD-10-CM

## 2022-11-21 MED ORDER — RABEPRAZOLE SODIUM 20 MG PO TBEC: 20.0000 mg | DELAYED_RELEASE_TABLET | Freq: Every day | ORAL | 3 refills | Status: DC

## 2022-11-21 NOTE — Patient Instructions (Addendum)
Lets try Aciphex 20 mg daily for your reflux to see if this will keep your symptoms controlled but also help limit your diarrhea.   Let me know if you are having reflux symptoms in the evening and we can increase Aciphex to twice a day if needed.   If Aciphex isn't working as well, we can go back to Nexium.   Continue to use imodium as needed for diarrhea. You can take every day if needed. Max 4 pills per day. Hold in the setting of constipation.   We will see you back in about 3 months or sooner if needed.   Ermalinda Memos, PA-C [facility]

## 2022-11-27 DIAGNOSIS — R002 Palpitations: Secondary | ICD-10-CM | POA: Diagnosis not present

## 2022-11-27 DIAGNOSIS — N183 Chronic kidney disease, stage 3 unspecified: Secondary | ICD-10-CM | POA: Diagnosis not present

## 2022-11-27 DIAGNOSIS — E1122 Type 2 diabetes mellitus with diabetic chronic kidney disease: Secondary | ICD-10-CM | POA: Diagnosis not present

## 2022-11-27 DIAGNOSIS — I129 Hypertensive chronic kidney disease with stage 1 through stage 4 chronic kidney disease, or unspecified chronic kidney disease: Secondary | ICD-10-CM | POA: Diagnosis not present

## 2022-12-03 ENCOUNTER — Encounter: Payer: Self-pay | Admitting: Internal Medicine

## 2022-12-03 DIAGNOSIS — H5203 Hypermetropia, bilateral: Secondary | ICD-10-CM | POA: Diagnosis not present

## 2022-12-03 DIAGNOSIS — H527 Unspecified disorder of refraction: Secondary | ICD-10-CM | POA: Diagnosis not present

## 2022-12-03 DIAGNOSIS — H52203 Unspecified astigmatism, bilateral: Secondary | ICD-10-CM | POA: Diagnosis not present

## 2022-12-03 DIAGNOSIS — H524 Presbyopia: Secondary | ICD-10-CM | POA: Diagnosis not present

## 2022-12-10 DIAGNOSIS — C49A2 Gastrointestinal stromal tumor of stomach: Secondary | ICD-10-CM | POA: Diagnosis not present

## 2022-12-30 ENCOUNTER — Other Ambulatory Visit: Payer: Self-pay | Admitting: *Deleted

## 2022-12-30 ENCOUNTER — Telehealth: Payer: Self-pay | Admitting: *Deleted

## 2022-12-30 DIAGNOSIS — K869 Disease of pancreas, unspecified: Secondary | ICD-10-CM

## 2022-12-30 NOTE — Telephone Encounter (Signed)
Donald Klein 10:17 AM Donald Klein (MRN: [id number])  he got a letter to schedule a MRI/MRCP and he came by to see if we could get it scheduled.. he is here.  Advised pt will need to get PA from insurances before scheduling MRI.  [employer] PA: Approval # [id number] DOS: 12/30/22-02/28/23 Blue E: Pended

## 2022-12-30 NOTE — Telephone Encounter (Signed)
Received vm Tia from [employer] called and stated no pa was needed since pt had medicare as primary insurance, therefore pa was cancelled.

## 2023-01-10 ENCOUNTER — Ambulatory Visit (HOSPITAL_COMMUNITY)
Admission: RE | Admit: 2023-01-10 | Discharge: 2023-01-10 | Disposition: A | Discharge status: Home / Self Care | Payer: Medicare PPO | Source: Ambulatory Visit | Attending: Gastroenterology

## 2023-01-10 ENCOUNTER — Other Ambulatory Visit (HOSPITAL_COMMUNITY): Payer: Self-pay | Admitting: Gastroenterology

## 2023-01-10 DIAGNOSIS — K869 Disease of pancreas, unspecified: Secondary | ICD-10-CM | POA: Diagnosis not present

## 2023-01-10 MED ORDER — GADOBUTROL 1 MMOL/ML IV SOLN
7.5000 mL | Freq: Once | INTRAVENOUS | Status: AC | PRN
  Administered 2023-01-10: 7.5 mL via INTRAVENOUS

## 2023-01-28 DIAGNOSIS — C49A0 Gastrointestinal stromal tumor, unspecified site: Secondary | ICD-10-CM | POA: Diagnosis not present

## 2023-01-28 DIAGNOSIS — C49A2 Gastrointestinal stromal tumor of stomach: Secondary | ICD-10-CM | POA: Diagnosis not present

## 2023-01-28 DIAGNOSIS — C49A4 Gastrointestinal stromal tumor of large intestine: Secondary | ICD-10-CM | POA: Diagnosis not present

## 2023-02-11 DIAGNOSIS — Z23 Encounter for immunization: Secondary | ICD-10-CM | POA: Diagnosis not present

## 2023-02-11 DIAGNOSIS — L603 Nail dystrophy: Secondary | ICD-10-CM | POA: Diagnosis not present

## 2023-02-17 DIAGNOSIS — C49A2 Gastrointestinal stromal tumor of stomach: Secondary | ICD-10-CM | POA: Diagnosis not present

## 2023-02-19 NOTE — Progress Notes (Deleted)
Referring Provider: Pearson Grippe, MD Primary Care Physician:  Pearson Grippe, MD Primary GI Physician: Dr. Jena Gauss  No chief complaint on file.   HPI:   Donald Klein is a 77 y.o. 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 He is presenting today for follow-up on GERD/chronic diarrhea.   Last seen in the office 11/21/2022.  GERD was well-controlled on Nexium 20 mg twice daily, but patient queried whether Nexium was contributing to his chronic diarrhea which was at baseline and controlled with Imodium as needed.  Planned to try him on Aciphex 20 mg daily, continue Imodium as needed, follow-up in 3 months or sooner if needed.  Today   GERD:   Chronic diarrhea:    Fish oil and magnesium do worsen his diarrhea. Gleevec also seems to worsen diarrhea. Hot tea or coffee will cause him to have more diarrhea. Fatty foods does not seem to affect him so much.   Pancreatic IPMN:  Recent MRI 01/10/2023 with stable 0.7 cm cystic lesion in the ventral pancreatic head, most consistent with small sidebranch IPMN. Needs repeat in 2 years ***  Past Medical History:  Diagnosis Date   Anemia    Anxiety    Barrett esophagus    Last EGD 05/27/08 Barrett's without dyplasia. EGD due 05/2011   Chronic back pain    Colitis    In 1980s, dx with UC by Dr. Welton Flakes at time of TCS, 1992 TCS, chronic colitis   Depression    DM type 2 (diabetes mellitus, type 2) (HCC)    Fatty liver     small spots on liver also   Fibromyalgia    GERD (gastroesophageal reflux disease)    GIST, malignant (HCC)    Recurrent, on Gleevec, followed by [facility]   Helicobacter  pylori gastritis 2009   treated   History of hiatal hernia    HTN (hypertension)    Hydrocele    Hyperlipidemia    IBS (irritable bowel syndrome)    Malignant stromal tumor of stomach (HCC)    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   Perceptive hearing loss, both sides    weras hearing aides both ears   Prostate cancer (HCC)    PTSD (post-traumatic stress disorder)    PTSD (post-traumatic stress disorder)    Pulmonary nodules chest ct 08-25-2019   stable per chest ct   Skin abnormality 05/31/2020   sore on right shin x 1 week no drainage healing wife putting neosporin on daily   Sleep apnea    no cpap did not tolerate cpap    Small bowel tumor    presented with SBO (distal-near TI), path showed spindle cell neoplasm 6.5cm   Tubular adenoma of colon 10/08/2010   Last colonosocpy 1 cecal TA   Wears glasses     Past Surgical History:  Procedure Laterality Date   APPENDECTOMY  July 2014   at time of small bowel resection   BIOPSY  01/06/2018   Procedure: BIOPSY;  Surgeon: Corbin Ade, MD;  [facility];  Service: Endoscopy;;  gastric biopsy , esophageal biopsy   BIOPSY  10/29/2018   Procedure: BIOPSY;  Surgeon: Jena Gauss,  Gerrit Friends, MD;  [facility];  Service: Endoscopy;;  gastric, esophageal   CARDIAC CATHETERIZATION  2001   saw cardiology for 1 year and released   CHOLECYSTECTOMY  1980's or 1990's   COLONOSCOPY  10/08/2010   Normal rectum/tubular adenoma/normal random biopsy. Next TCS due 10/2015.   COLONOSCOPY N/A 09/30/2014   Dr. Jena Gauss: Redunant colon . Status post segmental biopsy and stool sampling. Negative stool samples and colonic biopsies.    COLONOSCOPY WITH PROPOFOL N/A 10/29/2018   Surgeon: Corbin Ade, MD; markedly redundant colon with benign-appearing noncritical colonic stricture, inability to identify landmarks of cecum and ileocecal valve.  Apparently patient had virtual colonoscopy thereafter.  Dr. Jena Gauss did not recommend repeating colonoscopy.    ESOPHAGOGASTRODUODENOSCOPY  07/31/2011   Dr. Jens Som segment Barrett's esophagus s/p bx Hiatal hernia. Duodenal diverticulum. No dysplasia.Next EGD 07/2014.   ESOPHAGOGASTRODUODENOSCOPY N/A 09/30/2014   Dr. Jena Gauss: Abnormal distal esophagus consistant with prior diagnosis of short segment Barrett's esophagus status post biopsy. 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 Hiatal hernia. Abnormal Gastric mucosa of uncertain significance status post gastric biopsy. path with +Barrett's esophagus but no dysplasia, mild chronic gastritis. Surveillance for Barrett's due in 2019   ESOPHAGOGASTRODUODENOSCOPY N/A 01/06/2018   Procedure: ESOPHAGOGASTRODUODENOSCOPY (EGD);  Surgeon: Corbin Ade, MD;  [facility];  Service: Endoscopy;  Laterality: N/A;  12:00pm   ESOPHAGOGASTRODUODENOSCOPY (EGD) WITH PROPOFOL N/A 10/29/2018    Surgeon: Corbin Ade, MD;  appearance of Barrett's esophagus though biopsy with reactive/regenerative changes, moderate gastric mucosa with some deformity likely related to prior surgery s/p biopsy (chronic gastritis), normal examined duodenum.   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)   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 on 07/2015    PROSTATE BIOPSY  2021   RADIOACTIVE SEED IMPLANT N/A 06/02/2020   Procedure: RADIOACTIVE SEED IMPLANT/BRACHYTHERAPY IMPLANT;  Surgeon: Malen Gauze, MD;  Location: [facility];  Service: Urology;  Laterality: N/A;   SPACE OAR INSTILLATION N/A 06/02/2020   Procedure: SPACE OAR INSTILLATION;  Surgeon: Malen Gauze, MD;  Location: [facility];  Service:  Urology;  Laterality: N/A;    Current Outpatient Medications  Medication Sig Dispense Refill   alfuzosin (UROXATRAL) 10 MG 24 hr tablet TAKE 1 TABLET(10 MG) BY MOUTH DAILY WITH BREAKFAST 30 tablet 11   alfuzosin (UROXATRAL) 10 MG 24 hr tablet TAKE 1 TABLET(10 MG) BY MOUTH DAILY WITH BREAKFAST 30 tablet 11   Artificial Tear Ointment (DRY EYES OP) Place 1 drop into both eyes 2 (two) times daily as needed (dry eyes).      aspirin 81 MG EC tablet      CALCIUM PO Take 1 tablet by mouth 2 (two) times daily.     Cholecalciferol 25 MCG (1000 UT) tablet Take by mouth daily.     diazepam (VALIUM) 5 MG tablet Take 5 mg by mouth daily as needed for anxiety.     ferrous sulfate 325 (65 FE) MG tablet Take 325 mg by mouth. Monday Wednesday Friday     gabapentin (NEURONTIN) 400 MG capsule Take 400 mg by mouth at bedtime.     GARLIC PO Take by mouth  daily.     hydrocortisone (ANUSOL-HC) 2.5 % rectal cream Place 1 application rectally 2 (two) times daily. For up to 10 days at a time 30 g 2   imatinib (GLEEVEC) 400 MG tablet Take 1 tablet by mouth at bedtime.     JANUVIA 100 MG tablet Take 100 mg by mouth daily.     latanoprost (XALATAN) 0.005 % ophthalmic solution SMARTSIG:In Eye(s)     lidocaine (LIDODERM) 5 % Place 1 patch onto the skin daily. Remove & Discard patch within 12 hours or as directed by MD (Patient not taking: Reported on 11/21/2022) 30 patch 0   loperamide (IMODIUM) 2 MG capsule Take by mouth as needed for diarrhea or loose stools.     losartan (COZAAR) 100 MG tablet Take 100 mg by mouth daily.     LOVAZA 1 G capsule Take 1 capsule by mouth 2 (two) times daily.      Magnesium 400 MG CAPS Take by mouth daily.     metoprolol (LOPRESSOR) 50 MG tablet Take 50-75 mg by mouth 2 (two) times daily. 50mg  in am and 75mg  at night     mirabegron ER (MYRBETRIQ) 25 MG TB24 tablet Take 1 tablet (25 mg total) by mouth daily. 30 tablet 11   Multiple Vitamins-Minerals (MULTIVITAMIN WITH MINERALS) tablet Take 1  tablet by mouth daily. Centrum adult 50 plus     neomycin-bacitracin-polymyxin (NEOSPORIN) ointment Apply 1 application topically every 12 (twelve) hours. To right shin daily     nitroGLYCERIN (NITROSTAT) 0.4 MG SL tablet Place 0.4 mg under the tongue every 5 (five) minutes as needed for chest pain.     NON FORMULARY Eye drop lantaprost 125 mg 1 drop both eyes at hs     Omega-3 Fatty Acids (FISH OIL) 1000 MG CAPS Take by mouth in the morning and at bedtime.     QUEtiapine (SEROQUEL) 50 MG tablet Take 25 mg by mouth at bedtime as needed for depression. Ordered to take 1 and 1/2 tabs prn qhs Taking 25 mg prn qhs prn     RABEprazole (ACIPHEX) 20 MG tablet Take 1 tablet (20 mg total) by mouth daily. 30 tablet 3   rosuvastatin (CRESTOR) 10 MG tablet Take 10 mg by mouth daily.      sertraline (ZOLOFT) 100 MG tablet Take 100 mg by mouth daily as needed (PTSD).     sildenafil (VIAGRA) 100 MG tablet Take 1 tablet (100 mg total) by mouth as needed for erectile dysfunction. 10 tablet 5   silodosin (RAPAFLO) 8 MG CAPS capsule TAKE 1 CAPSULE(8 MG) BY MOUTH AT BEDTIME 30 capsule 11   vitamin C (ASCORBIC ACID) 250 MG tablet Take 250 mg by mouth daily.     No current facility-administered medications for this visit.    Allergies as of 02/21/2023 - Review Complete 01/10/2023  Allergen Reaction Noted   Lisinopril Other (See Comments) 12/29/2017   Metformin Diarrhea 03/05/2019   Enoxaparin Rash 01/01/2013   Lovenox [enoxaparin sodium] Hives and Rash 09/30/2014   Penicillins Rash     Family History  Problem Relation Age of Onset   Heart disease Mother    Suicidality Mother    Heart attack Father        10   Heart disease Father    Heart disease Sister    Prostate cancer Brother    Colon cancer Neg Hx    Breast cancer Neg Hx    Pancreatic cancer Neg Hx     Social History  Socioeconomic History   Marital status: Married    Spouse name: Not on file   Number of children: 1   Years of  education: Not on file   Highest education level: Not on file  Occupational History   Occupation: disability  Tobacco Use   Smoking status: Never   Smokeless tobacco: Never  Vaping Use   Vaping status: Never Used  Substance and Sexual Activity   Alcohol use: No    Alcohol/week: 0.0 standard drinks of alcohol   Drug use: No   Sexual activity: Yes    Partners: Female    Birth control/protection: None    Comment: spouse  Other Topics Concern   Not on file  Social History Narrative   Not on file   Social Determinants of Health   Financial Resource Strain: Not on file  Food Insecurity: Not on file  Transportation Needs: Not on file  Physical Activity: Not on file  Stress: Not on file  Social Connections: Not on file    Review of Systems: Gen: Denies fever, chills, anorexia. Denies fatigue, weakness, weight loss.  CV: Denies chest pain, palpitations, syncope, peripheral edema, and claudication. Resp: Denies dyspnea at rest, cough, wheezing, coughing up blood, and pleurisy. GI: Denies vomiting blood, jaundice, and fecal incontinence.   Denies dysphagia or odynophagia. Derm: Denies rash, itching, dry skin Psych: Denies depression, anxiety, memory loss, confusion. No homicidal or suicidal ideation.  Heme: Denies bruising, bleeding, and enlarged lymph nodes.  Physical Exam: There were no vitals taken for this visit. General:   Alert and oriented. No distress noted. Pleasant and cooperative.  Head:  Normocephalic and atraumatic. Eyes:  Conjuctiva clear without scleral icterus. Heart:  S1, S2 present without murmurs appreciated. Lungs:  Clear to auscultation bilaterally. No wheezes, rales, or rhonchi. No distress.  Abdomen:  +BS, soft, non-tender and non-distended. No rebound or guarding. No HSM or masses noted. Msk:  Symmetrical without gross deformities. Normal posture. Extremities:  Without edema. Neurologic:  Alert and  oriented x4 Psych:  Normal mood and  affect.    Assessment:     Plan:  ***   Ermalinda Memos, PA-C [facility] 02/21/2023

## 2023-02-21 ENCOUNTER — Ambulatory Visit: Payer: Medicare PPO | Admitting: Gastroenterology

## 2023-02-21 ENCOUNTER — Encounter: Payer: Self-pay | Admitting: Gastroenterology

## 2023-03-04 NOTE — Progress Notes (Unsigned)
Referring Provider: Pearson Grippe, MD Primary Care Physician:  Pearson Grippe, MD Primary GI Physician: Dr. Jena Gauss  No chief complaint on file.   HPI:   Donald Klein is a 77 y.o. 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 He is presenting today for follow-up on GERD/chronic diarrhea.    Last seen in the office 11/21/2022.  GERD was well-controlled on Nexium 20 mg twice daily, but patient queried whether Nexium was contributing to his chronic diarrhea which was at baseline and controlled with Imodium as needed.  Planned to try him on Aciphex 20 mg daily, continue Imodium as needed, follow-up in 3 months or sooner if needed.   Today    GERD:    Chronic diarrhea:      Fish oil and magnesium do worsen his diarrhea. Gleevec also seems to worsen diarrhea. Hot tea or coffee will cause him to have more diarrhea. Fatty foods does not seem to affect him so much.    Pancreatic IPMN:  Recent MRI 01/10/2023 with stable 0.7 cm cystic lesion in the ventral pancreatic head, most consistent with small sidebranch IPMN. Needs repeat in 2 years ***  Past Medical History:  Diagnosis Date   Anemia    Anxiety    Barrett esophagus    Last EGD 05/27/08 Barrett's without dyplasia. EGD due 05/2011   Chronic back pain    Colitis    In 1980s, dx with UC by Dr. Welton Flakes at time of TCS, 1992 TCS, chronic colitis   Depression    DM type 2 (diabetes mellitus, type 2) (HCC)    Fatty liver     small spots on liver also   Fibromyalgia    GERD (gastroesophageal reflux disease)    GIST, malignant (HCC)    Recurrent, on Gleevec, followed by [facility]    Helicobacter pylori gastritis 2009   treated   History of hiatal hernia    HTN (hypertension)    Hydrocele    Hyperlipidemia    IBS (irritable bowel syndrome)    Malignant stromal tumor of stomach (HCC)    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   Perceptive hearing loss, both sides    weras hearing aides both ears   Prostate cancer (HCC)    PTSD (post-traumatic stress disorder)    PTSD (post-traumatic stress disorder)    Pulmonary nodules chest ct 08-25-2019   stable per chest ct   Skin abnormality 05/31/2020   sore on right shin x 1 week no drainage healing wife putting neosporin on daily   Sleep apnea    no cpap did not tolerate cpap    Small bowel tumor    presented with SBO (distal-near TI), path showed spindle cell neoplasm 6.5cm   Tubular adenoma of colon 10/08/2010   Last colonosocpy 1 cecal TA   Wears glasses     Past Surgical History:  Procedure Laterality Date   APPENDECTOMY  July 2014   at time of small bowel resection   BIOPSY  01/06/2018   Procedure: BIOPSY;  Surgeon: Corbin Ade, MD;  [facility];  Service: Endoscopy;;  gastric biopsy , esophageal biopsy   BIOPSY  10/29/2018   Procedure: BIOPSY;  Surgeon: Corbin Ade, MD;  [facility];  Service: Endoscopy;;  gastric, esophageal   CARDIAC CATHETERIZATION  2001   saw cardiology for 1 year and released   CHOLECYSTECTOMY  1980's or 1990's   COLONOSCOPY  10/08/2010   Normal rectum/tubular adenoma/normal random biopsy. Next TCS due 10/2015.   COLONOSCOPY N/A 09/30/2014   Dr. Jena Gauss: Redunant colon . Status post segmental biopsy and stool sampling. Negative stool samples and colonic biopsies.    COLONOSCOPY WITH PROPOFOL N/A 10/29/2018   Surgeon: Corbin Ade, MD; markedly redundant colon with benign-appearing noncritical colonic stricture, inability to identify landmarks of cecum and ileocecal valve.  Apparently patient had virtual colonoscopy thereafter.  Dr. Jena Gauss did not recommend repeating  colonoscopy.   ESOPHAGOGASTRODUODENOSCOPY  07/31/2011   Dr. Jens Som segment Barrett's esophagus s/p bx Hiatal hernia. Duodenal diverticulum. No dysplasia.Next EGD 07/2014.   ESOPHAGOGASTRODUODENOSCOPY N/A 09/30/2014   Dr. Jena Gauss: Abnormal distal esophagus consistant with prior diagnosis of short segment Barrett's esophagus status post biopsy. 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 Hiatal hernia. Abnormal Gastric mucosa of uncertain significance status post gastric biopsy. path with +Barrett's esophagus but no dysplasia, mild chronic gastritis. Surveillance for Barrett's due in 2019   ESOPHAGOGASTRODUODENOSCOPY N/A 01/06/2018   Procedure: ESOPHAGOGASTRODUODENOSCOPY (EGD);  Surgeon: Corbin Ade, MD;  [facility];  Service: Endoscopy;  Laterality: N/A;  12:00pm   ESOPHAGOGASTRODUODENOSCOPY (EGD) WITH PROPOFOL N/A 10/29/2018    Surgeon: Corbin Ade, MD;  appearance of Barrett's esophagus though biopsy with reactive/regenerative changes, moderate gastric mucosa with some deformity likely related to prior surgery s/p biopsy (chronic gastritis), normal examined duodenum.   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)   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 on 07/2015    PROSTATE BIOPSY  2021   RADIOACTIVE SEED IMPLANT N/A 06/02/2020   Procedure: RADIOACTIVE SEED IMPLANT/BRACHYTHERAPY IMPLANT;  Surgeon: Malen Gauze, MD;  Location: [facility];  Service: Urology;  Laterality: N/A;   SPACE OAR INSTILLATION N/A 06/02/2020   Procedure: SPACE OAR INSTILLATION;  Surgeon: Malen Gauze, MD;  Location: [facility];  Service: Urology;  Laterality: N/A;    Current Outpatient Medications  Medication Sig Dispense Refill   alfuzosin (UROXATRAL) 10 MG 24 hr tablet TAKE 1 TABLET(10 MG) BY MOUTH DAILY WITH BREAKFAST 30 tablet 11   alfuzosin (UROXATRAL) 10 MG 24 hr tablet TAKE 1 TABLET(10 MG) BY MOUTH DAILY WITH BREAKFAST 30 tablet 11   Artificial Tear Ointment (DRY EYES OP) Place 1 drop into both eyes 2 (two) times daily as needed (dry eyes).      aspirin 81 MG EC tablet      CALCIUM PO Take 1 tablet by mouth 2 (two) times daily.     Cholecalciferol 25 MCG (1000 UT) tablet Take by mouth daily.     diazepam (VALIUM) 5 MG tablet Take 5 mg by mouth daily as needed for anxiety.     ferrous sulfate 325 (65 FE) MG tablet Take 325 mg by mouth. Monday Wednesday Friday     gabapentin (NEURONTIN) 400 MG capsule Take 400 mg by mouth at bedtime.  GARLIC PO Take by mouth daily.     hydrocortisone (ANUSOL-HC) 2.5 % rectal cream Place 1 application rectally 2 (two) times daily. For up to 10 days at a time 30 g 2   imatinib (GLEEVEC) 400 MG tablet Take 1 tablet by mouth at bedtime.     JANUVIA 100 MG tablet Take 100 mg by mouth daily.     latanoprost (XALATAN) 0.005 % ophthalmic solution SMARTSIG:In Eye(s)     lidocaine (LIDODERM) 5 % Place 1 patch onto the skin daily. Remove & Discard patch within 12 hours or as directed by MD (Patient not taking: Reported on 11/21/2022) 30 patch 0   loperamide (IMODIUM) 2 MG capsule Take by mouth as needed for diarrhea or loose stools.     losartan (COZAAR) 100 MG tablet Take 100 mg by mouth daily.     LOVAZA 1 G capsule Take 1 capsule by mouth 2 (two) times daily.      Magnesium 400 MG CAPS Take by mouth daily.     metoprolol (LOPRESSOR) 50 MG tablet Take 50-75 mg by mouth 2 (two) times daily. 50mg  in am and 75mg  at night     mirabegron ER (MYRBETRIQ) 25 MG TB24 tablet Take 1 tablet (25 mg total) by mouth daily. 30 tablet 11   Multiple Vitamins-Minerals (MULTIVITAMIN WITH  MINERALS) tablet Take 1 tablet by mouth daily. Centrum adult 50 plus     neomycin-bacitracin-polymyxin (NEOSPORIN) ointment Apply 1 application topically every 12 (twelve) hours. To right shin daily     nitroGLYCERIN (NITROSTAT) 0.4 MG SL tablet Place 0.4 mg under the tongue every 5 (five) minutes as needed for chest pain.     NON FORMULARY Eye drop lantaprost 125 mg 1 drop both eyes at hs     Omega-3 Fatty Acids (FISH OIL) 1000 MG CAPS Take by mouth in the morning and at bedtime.     QUEtiapine (SEROQUEL) 50 MG tablet Take 25 mg by mouth at bedtime as needed for depression. Ordered to take 1 and 1/2 tabs prn qhs Taking 25 mg prn qhs prn     RABEprazole (ACIPHEX) 20 MG tablet Take 1 tablet (20 mg total) by mouth daily. 30 tablet 3   rosuvastatin (CRESTOR) 10 MG tablet Take 10 mg by mouth daily.      sertraline (ZOLOFT) 100 MG tablet Take 100 mg by mouth daily as needed (PTSD).     sildenafil (VIAGRA) 100 MG tablet Take 1 tablet (100 mg total) by mouth as needed for erectile dysfunction. 10 tablet 5   silodosin (RAPAFLO) 8 MG CAPS capsule TAKE 1 CAPSULE(8 MG) BY MOUTH AT BEDTIME 30 capsule 11   vitamin C (ASCORBIC ACID) 250 MG tablet Take 250 mg by mouth daily.     No current facility-administered medications for this visit.    Allergies as of 03/06/2023 - Review Complete 01/10/2023  Allergen Reaction Noted   Lisinopril Other (See Comments) 12/29/2017   Metformin Diarrhea 03/05/2019   Enoxaparin Rash 01/01/2013   Lovenox [enoxaparin sodium] Hives and Rash 09/30/2014   Penicillins Rash     Family History  Problem Relation Age of Onset   Heart disease Mother    Suicidality Mother    Heart attack Father        96   Heart disease Father    Heart disease Sister    Prostate cancer Brother    Colon cancer Neg Hx    Breast cancer Neg Hx    Pancreatic cancer Neg Hx  Social History   Socioeconomic History   Marital status: Married    Spouse name: Not on file   Number of children:  1   Years of education: Not on file   Highest education level: Not on file  Occupational History   Occupation: disability  Tobacco Use   Smoking status: Never   Smokeless tobacco: Never  Vaping Use   Vaping status: Never Used  Substance and Sexual Activity   Alcohol use: No    Alcohol/week: 0.0 standard drinks of alcohol   Drug use: No   Sexual activity: Yes    Partners: Female    Birth control/protection: None    Comment: spouse  Other Topics Concern   Not on file  Social History Narrative   Not on file   Social Determinants of Health   Financial Resource Strain: Not on file  Food Insecurity: Not on file  Transportation Needs: Not on file  Physical Activity: Not on file  Stress: Not on file  Social Connections: Not on file    Review of Systems: Gen: Denies fever, chills, anorexia. Denies fatigue, weakness, weight loss.  CV: Denies chest pain, palpitations, syncope, peripheral edema, and claudication. Resp: Denies dyspnea at rest, cough, wheezing, coughing up blood, and pleurisy. GI: Denies vomiting blood, jaundice, and fecal incontinence.   Denies dysphagia or odynophagia. Derm: Denies rash, itching, dry skin Psych: Denies depression, anxiety, memory loss, confusion. No homicidal or suicidal ideation.  Heme: Denies bruising, bleeding, and enlarged lymph nodes.  Physical Exam: There were no vitals taken for this visit. General:   Alert and oriented. No distress noted. Pleasant and cooperative.  Head:  Normocephalic and atraumatic. Eyes:  Conjuctiva clear without scleral icterus. Heart:  S1, S2 present without murmurs appreciated. Lungs:  Clear to auscultation bilaterally. No wheezes, rales, or rhonchi. No distress.  Abdomen:  +BS, soft, non-tender and non-distended. No rebound or guarding. No HSM or masses noted. Msk:  Symmetrical without gross deformities. Normal posture. Extremities:  Without edema. Neurologic:  Alert and  oriented x4 Psych:  Normal mood and  affect.    Assessment:     Plan:  ***   Ermalinda Memos, PA-C [facility] 03/06/2023

## 2023-03-06 ENCOUNTER — Telehealth: Payer: Self-pay | Admitting: Gastroenterology

## 2023-03-06 ENCOUNTER — Ambulatory Visit: Payer: Medicare PPO | Admitting: Gastroenterology

## 2023-03-06 ENCOUNTER — Encounter: Payer: Self-pay | Admitting: Gastroenterology

## 2023-03-06 VITALS — BP 134/66 | HR 66 | Temp 97.8°F | Ht 68.0 in | Wt 171.0 lb

## 2023-03-06 DIAGNOSIS — K869 Disease of pancreas, unspecified: Secondary | ICD-10-CM | POA: Diagnosis not present

## 2023-03-06 DIAGNOSIS — K219 Gastro-esophageal reflux disease without esophagitis: Secondary | ICD-10-CM

## 2023-03-06 DIAGNOSIS — K529 Noninfective gastroenteritis and colitis, unspecified: Secondary | ICD-10-CM

## 2023-03-06 MED ORDER — RABEPRAZOLE SODIUM 20 MG PO TBEC
20.0000 mg | DELAYED_RELEASE_TABLET | Freq: Every day | ORAL | 3 refills | Status: DC
Start: 2023-03-06 — End: 2023-09-10

## 2023-03-06 NOTE — Telephone Encounter (Signed)
Please place patient on recall for surveillance MRI/MRCP for September 2026. Dx: Pancreatic lesion, likely IPMN.

## 2023-03-06 NOTE — Patient Instructions (Signed)
Continue Aciphex 20 mg daily 30 minutes for breakfast for reflux.  Continue to use Imodium as needed for diarrhea.  I will see back in 6 months or sooner if needed.  It was great to see you again today!  Ermalinda Memos, PA-C [facility]

## 2023-03-12 ENCOUNTER — Other Ambulatory Visit: Payer: Self-pay | Admitting: Nephrology

## 2023-03-12 DIAGNOSIS — I10 Essential (primary) hypertension: Secondary | ICD-10-CM | POA: Diagnosis not present

## 2023-03-12 DIAGNOSIS — N1831 Chronic kidney disease, stage 3a: Secondary | ICD-10-CM

## 2023-03-12 DIAGNOSIS — D631 Anemia in chronic kidney disease: Secondary | ICD-10-CM | POA: Diagnosis not present

## 2023-03-12 DIAGNOSIS — E1122 Type 2 diabetes mellitus with diabetic chronic kidney disease: Secondary | ICD-10-CM | POA: Diagnosis not present

## 2023-04-08 DIAGNOSIS — C49A2 Gastrointestinal stromal tumor of stomach: Secondary | ICD-10-CM | POA: Diagnosis not present

## 2023-04-08 DIAGNOSIS — C49A4 Gastrointestinal stromal tumor of large intestine: Secondary | ICD-10-CM | POA: Diagnosis not present

## 2023-04-16 ENCOUNTER — Other Ambulatory Visit: Payer: Medicare PPO

## 2023-04-23 ENCOUNTER — Ambulatory Visit: Payer: Medicare PPO | Admitting: Urology

## 2023-04-24 DIAGNOSIS — I10 Essential (primary) hypertension: Secondary | ICD-10-CM | POA: Diagnosis not present

## 2023-04-24 DIAGNOSIS — N1832 Chronic kidney disease, stage 3b: Secondary | ICD-10-CM | POA: Diagnosis not present

## 2023-04-24 DIAGNOSIS — E1122 Type 2 diabetes mellitus with diabetic chronic kidney disease: Secondary | ICD-10-CM | POA: Diagnosis not present

## 2023-04-24 DIAGNOSIS — D631 Anemia in chronic kidney disease: Secondary | ICD-10-CM | POA: Diagnosis not present

## 2023-04-25 DIAGNOSIS — H401131 Primary open-angle glaucoma, bilateral, mild stage: Secondary | ICD-10-CM | POA: Diagnosis not present

## 2023-05-27 DIAGNOSIS — C49A4 Gastrointestinal stromal tumor of large intestine: Secondary | ICD-10-CM | POA: Diagnosis not present

## 2023-06-19 ENCOUNTER — Other Ambulatory Visit: Payer: Self-pay

## 2023-06-19 DIAGNOSIS — C61 Malignant neoplasm of prostate: Secondary | ICD-10-CM

## 2023-06-25 ENCOUNTER — Other Ambulatory Visit: Payer: Medicare PPO

## 2023-06-25 DIAGNOSIS — C61 Malignant neoplasm of prostate: Secondary | ICD-10-CM

## 2023-06-27 LAB — PSA: Prostate Specific Ag, Serum: 0.2 ng/mL (ref 0.0–4.0)

## 2023-07-02 ENCOUNTER — Ambulatory Visit: Payer: Medicare PPO | Admitting: Urology

## 2023-07-02 VITALS — BP 133/57 | HR 64

## 2023-07-02 DIAGNOSIS — N401 Enlarged prostate with lower urinary tract symptoms: Secondary | ICD-10-CM

## 2023-07-02 DIAGNOSIS — N5201 Erectile dysfunction due to arterial insufficiency: Secondary | ICD-10-CM | POA: Diagnosis not present

## 2023-07-02 DIAGNOSIS — R351 Nocturia: Secondary | ICD-10-CM

## 2023-07-02 DIAGNOSIS — N138 Other obstructive and reflux uropathy: Secondary | ICD-10-CM

## 2023-07-02 DIAGNOSIS — C61 Malignant neoplasm of prostate: Secondary | ICD-10-CM | POA: Diagnosis not present

## 2023-07-02 MED ORDER — ALFUZOSIN HCL ER 10 MG PO TB24: 10.0000 mg | ORAL_TABLET | Freq: Every day | ORAL | 11 refills | Status: AC

## 2023-07-02 MED ORDER — MIRABEGRON ER 25 MG PO TB24: 25.0000 mg | ORAL_TABLET | Freq: Every day | ORAL | Status: DC

## 2023-07-02 MED ORDER — TADALAFIL 20 MG PO TABS: 20.0000 mg | ORAL_TABLET | Freq: Every day | ORAL | 5 refills | Status: AC | PRN

## 2023-07-02 NOTE — Progress Notes (Signed)
 07/02/2023 4:19 PM   Donald Klein [birth year] [id number]  Referring provider: Pearson Grippe, MD [address] [place],  [place] [address]  Followup prostate cancer   HPI: Donald Klein is a 77yo here for followup for prostate cancer, BPh and erectile dysfunction. PSA decreased to 0.2. IPSS 8 QOL 2 on uroxatral 10mg  at bedtime. He uses sildenafil with poor results.He has not tried tadalafil previously   PMH: Past Medical History:  Diagnosis Date   Anemia    Anxiety    Barrett esophagus    Last EGD 05/27/08 Barrett's without dyplasia. EGD due 05/2011   Chronic back pain    Colitis    In 1980s, dx with UC by Donald Klein at time of TCS, 1992 TCS, chronic colitis   Depression    DM type 2 (diabetes mellitus, type 2) (HCC)    Fatty liver     small spots on liver also   Fibromyalgia    GERD (gastroesophageal reflux disease)    GIST, malignant (HCC)    Recurrent, on Gleevec, followed by [facility]   Helicobacter pylori gastritis 2009   treated   History of hiatal hernia    HTN (hypertension)    Hydrocele    Hyperlipidemia    IBS (irritable bowel syndrome)    Malignant stromal tumor of stomach (HCC)    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   Perceptive hearing loss, both sides    weras hearing aides both ears   Prostate cancer (HCC)    PTSD (post-traumatic stress disorder)    PTSD (post-traumatic stress disorder)    Pulmonary nodules chest ct 08-25-2019   stable per chest ct   Skin abnormality 05/31/2020   sore on right shin x 1 week no drainage healing wife putting neosporin on daily   Sleep apnea    no cpap did not tolerate cpap    Small bowel tumor    presented with SBO (distal-near TI), path showed spindle cell neoplasm 6.5cm   Tubular adenoma of colon 10/08/2010   Last colonosocpy 1 cecal TA   Wears glasses     Surgical History: Past Surgical History:  Procedure Laterality Date   APPENDECTOMY  July 2014   at time of small bowel resection   BIOPSY  01/06/2018    Procedure: BIOPSY;  Surgeon: Donald Ade, MD;  [facility];  Service: Endoscopy;;  gastric biopsy , esophageal biopsy   BIOPSY  10/29/2018   Procedure: BIOPSY;  Surgeon: Donald Ade, MD;  [facility];  Service: Endoscopy;;  gastric, esophageal   CARDIAC CATHETERIZATION  2001   saw cardiology for 1 year and released   CHOLECYSTECTOMY  1980's or 1990's   COLONOSCOPY  10/08/2010   Normal rectum/tubular adenoma/normal random biopsy. Next TCS due 10/2015.   COLONOSCOPY N/A 09/30/2014   Dr. Jena Klein: Redunant colon . Status post segmental biopsy and stool sampling. Negative stool samples and colonic biopsies.    COLONOSCOPY WITH PROPOFOL N/A 10/29/2018   Surgeon: Donald Ade, MD; markedly redundant colon with benign-appearing noncritical colonic stricture, inability to identify landmarks of cecum and ileocecal valve.  Apparently patient had virtual colonoscopy thereafter.  Dr. Jena Klein did not recommend repeating colonoscopy.   ESOPHAGOGASTRODUODENOSCOPY  07/31/2011   Dr. Jens Klein segment Barrett's esophagus s/p bx Hiatal hernia. Duodenal diverticulum. No dysplasia.Next EGD 07/2014.   ESOPHAGOGASTRODUODENOSCOPY N/A 09/30/2014   Dr. Jena Klein: Abnormal distal esophagus consistant with prior diagnosis of short segment Barrett's esophagus status post biopsy. 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 Hiatal hernia. Abnormal Gastric mucosa of uncertain significance status post gastric biopsy. path with +Barrett's esophagus but no dysplasia, mild chronic gastritis. Surveillance for Barrett's due in 2019   ESOPHAGOGASTRODUODENOSCOPY N/A 01/06/2018   Procedure: ESOPHAGOGASTRODUODENOSCOPY (EGD);  Surgeon: Donald Ade, MD;  [facility];  Service: Endoscopy;  Laterality: N/A;  12:00pm   ESOPHAGOGASTRODUODENOSCOPY (EGD) WITH PROPOFOL N/A 10/29/2018    Surgeon: Donald Ade, MD;  appearance of Barrett's esophagus though biopsy with reactive/regenerative changes, moderate  gastric mucosa with some deformity likely related to prior surgery s/p biopsy (chronic gastritis), normal examined duodenum.   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)   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 on 07/2015    PROSTATE BIOPSY  2021   RADIOACTIVE SEED IMPLANT N/A 06/02/2020   Procedure: RADIOACTIVE SEED IMPLANT/BRACHYTHERAPY IMPLANT;  Surgeon: Donald Gauze, MD;  Location: [facility];  Service: Urology;  Laterality: N/A;   SPACE OAR INSTILLATION N/A 06/02/2020   Procedure: SPACE OAR INSTILLATION;  Surgeon: Donald Gauze, MD;  Location: [facility];  Service: Urology;  Laterality: N/A;    Home Medications:  Allergies as of 07/02/2023       Reactions   Lisinopril Other (See Comments)   Pt unsure    Metformin Diarrhea   Enoxaparin Rash   Other reaction(s): Eruption of skin   Lovenox [enoxaparin Sodium] Hives, Rash   Penicillins Rash    Has patient had a PCN reaction causing immediate rash, facial/tongue/throat swelling, SOB or lightheadedness with hypotension: Yes Has patient had a PCN reaction causing severe rash involving mucus membranes or skin necrosis: No Has patient had a PCN reaction that required hospitalization No Has patient had a PCN reaction occurring within the last 10 years: No If all of the above answers are "NO", then may proceed with Cephalosporin use.        Medication List        Accurate as of July 02, 2023  4:19 PM. If you have any questions, ask your nurse or doctor.          STOP taking these medications    diazepam 5 MG tablet Commonly known as:  VALIUM   Fish Oil 1000 MG Caps   gabapentin 400 MG capsule Commonly known as: NEURONTIN   GARLIC PO   hydrocortisone 2.5 % rectal cream Commonly known as: ANUSOL-HC   lidocaine 5 % Commonly known as: Lidoderm   QUEtiapine 50 MG tablet Commonly known as: SEROQUEL   sertraline 100 MG tablet Commonly known as: ZOLOFT       TAKE these medications    alfuzosin 10 MG 24 hr tablet Commonly known as: UROXATRAL TAKE 1 TABLET(10 MG) BY MOUTH DAILY WITH BREAKFAST   alfuzosin 10 MG 24 hr tablet Commonly known as: UROXATRAL TAKE 1 TABLET(10 MG) BY MOUTH DAILY WITH BREAKFAST   aspirin EC 81 MG tablet   CALCIUM PO Take 1 tablet by mouth 2 (two) times daily.   Cholecalciferol 25 MCG (1000 UT) tablet Take by mouth daily.   DRY EYES OP Place 1 drop into both eyes 2 (two) times daily as needed (dry  eyes).   ferrous sulfate 325 (65 FE) MG tablet Take 325 mg by mouth. Monday Wednesday Friday   imatinib 400 MG tablet Commonly known as: GLEEVEC Take 1 tablet by mouth at bedtime.   Januvia 100 MG tablet Generic drug: sitaGLIPtin Take 100 mg by mouth daily.   latanoprost 0.005 % ophthalmic solution Commonly known as: XALATAN SMARTSIG:In Eye(s)   loperamide 2 MG capsule Commonly known as: IMODIUM Take by mouth as needed for diarrhea or loose stools.   losartan 100 MG tablet Commonly known as: COZAAR Take 100 mg by mouth daily.   Lovaza 1 g capsule Generic drug: omega-3 acid ethyl esters Take 1 capsule by mouth 2 (two) times daily.   Magnesium 400 MG Caps Take by mouth daily.   metoprolol tartrate 50 MG tablet Commonly known as: LOPRESSOR Take 50-75 mg by mouth 2 (two) times daily. 50mg  in am and 75mg  at night   mirabegron ER 25 MG Tb24 tablet Commonly known as: MYRBETRIQ Take 1 tablet (25 mg total) by mouth daily.   multivitamin with minerals tablet Take 1 tablet by mouth daily. Centrum adult 50 plus   neomycin-bacitracin-polymyxin [id number]  ointment Commonly known as: NEOSPORIN Apply 1 application topically every 12 (twelve) hours. To right shin daily   nitroGLYCERIN 0.4 MG SL tablet Commonly known as: NITROSTAT Place 0.4 mg under the tongue every 5 (five) minutes as needed for chest pain.   NON FORMULARY Eye drop lantaprost 125 mg 1 drop both eyes at hs   RABEprazole 20 MG tablet Commonly known as: ACIPHEX Take 1 tablet (20 mg total) by mouth daily.   rosuvastatin 10 MG tablet Commonly known as: CRESTOR Take 10 mg by mouth daily.   sildenafil 100 MG tablet Commonly known as: VIAGRA Take 1 tablet (100 mg total) by mouth as needed for erectile dysfunction.   silodosin 8 MG Caps capsule Commonly known as: RAPAFLO TAKE 1 CAPSULE(8 MG) BY MOUTH AT BEDTIME   vitamin C 250 MG tablet Commonly known as: ASCORBIC ACID Take 250 mg by mouth daily.        Allergies:  Allergies  Allergen Reactions   Lisinopril Other (See Comments)    Pt unsure    Metformin Diarrhea   Enoxaparin Rash    Other reaction(s): Eruption of skin   Lovenox [Enoxaparin Sodium] Hives and Rash   Penicillins Rash     Has patient had a PCN reaction causing immediate rash, facial/tongue/throat swelling, SOB or lightheadedness with hypotension: Yes Has patient had a PCN reaction causing severe rash involving mucus membranes or skin necrosis: No Has patient had a PCN reaction that required hospitalization No Has patient had a PCN reaction occurring within the last 10 years: No If all of the above answers are "NO", then may proceed with Cephalosporin use.     Family History: Family History  Problem Relation Age of Onset   Heart disease Mother    Suicidality Mother    Heart attack Father        35   Heart disease Father    Heart disease Sister    Prostate cancer Brother    Colon cancer Neg Hx    Breast cancer Neg Hx    Pancreatic cancer Neg Hx     Social History:  reports that he has never smoked. He has never used smokeless tobacco.  He reports that he does not drink alcohol and does not use drugs.  ROS: All other review of systems were reviewed and are negative  except what is noted above in HPI  Physical Exam: BP (!) 133/57   Pulse 64   Constitutional:  Alert and oriented, No acute distress. HEENT: [place] AT, moist mucus membranes.  Trachea midline, no masses. Cardiovascular: No clubbing, cyanosis, or edema. Respiratory: Normal respiratory effort, no increased work of breathing. GI: Abdomen is soft, nontender, nondistended, no abdominal masses GU: No CVA tenderness.  Lymph: No cervical or inguinal lymphadenopathy. Skin: No rashes, bruises or suspicious lesions. Neurologic: Grossly intact, no focal deficits, moving all 4 extremities. Psychiatric: Normal mood and affect.  Laboratory Data: Lab Results  Component Value Date   WBC 4.4 05/30/2020   HGB 12.5 (L) 05/30/2020   HCT 36.6 (L) 05/30/2020   MCV 97.1 05/30/2020   PLT 170 05/30/2020    Lab Results  Component Value Date   CREATININE 1.29 (H) 05/30/2020    Lab Results  Component Value Date   PSA 12.0 (H) 11/22/2019    No results found for: "TESTOSTERONE"  No results found for: "HGBA1C"  Urinalysis    Component Value Date/Time   COLORURINE YELLOW 07/12/2018 1158   APPEARANCEUR Clear 04/23/2022 1413   LABSPEC 1.012 07/12/2018 1158   PHURINE 5.0 07/12/2018 1158   GLUCOSEU Negative 04/23/2022 1413   HGBUR NEGATIVE 07/12/2018 1158   BILIRUBINUR Negative 04/23/2022 1413   KETONESUR NEGATIVE 07/12/2018 1158   PROTEINUR 1+ (A) 04/23/2022 1413   PROTEINUR NEGATIVE 07/12/2018 1158   UROBILINOGEN 0.2 03/29/2010 0803   NITRITE Negative 04/23/2022 1413   NITRITE NEGATIVE 07/12/2018 1158   LEUKOCYTESUR Negative 04/23/2022 1413   LEUKOCYTESUR NEGATIVE 07/12/2018 1158    Lab Results  Component Value Date   LABMICR See below: 04/23/2022   WBCUA None seen 04/23/2022   LABEPIT 0-10 04/23/2022   MUCUS Present 04/18/2021   BACTERIA None seen 04/23/2022     Pertinent Imaging:  No results found for this or any previous visit.  No results found for this or any previous visit.  No results found for this or any previous visit.  No results found for this or any previous visit.  No results found for this or any previous visit.  No results found for this or any previous visit.  No results found for this or any previous visit.  No results found for this or any previous visit.   Assessment & Plan:    1. Prostate cancer (HCC) (Primary) Followup 1 year with PSA - Urinalysis, Routine w reflex microscopic  2. Nocturia Continue uroxatral and mirabegron  3. Benign prostatic hyperplasia with urinary obstruction Continue uroxatral and mirabegron  4. Erectile dysfunction due to arterial insufficiency We will trial tadalafil 20mg  prn   No follow-ups on file.  Wilkie Aye, MD  [facility] [place]

## 2023-07-03 LAB — URINALYSIS, ROUTINE W REFLEX MICROSCOPIC
Bilirubin, UA: NEGATIVE
Glucose, UA: NEGATIVE
Ketones, UA: NEGATIVE
Leukocytes,UA: NEGATIVE
Nitrite, UA: NEGATIVE
RBC, UA: NEGATIVE
Specific Gravity, UA: 1.02 (ref 1.005–1.030)
Urobilinogen, Ur: 0.2 mg/dL (ref 0.2–1.0)
pH, UA: 6 (ref 5.0–7.5)

## 2023-07-08 ENCOUNTER — Encounter: Payer: Self-pay | Admitting: Urology

## 2023-07-08 DIAGNOSIS — C49A4 Gastrointestinal stromal tumor of large intestine: Secondary | ICD-10-CM | POA: Diagnosis not present

## 2023-07-08 NOTE — Patient Instructions (Signed)

## 2023-08-05 DIAGNOSIS — C49A4 Gastrointestinal stromal tumor of large intestine: Secondary | ICD-10-CM | POA: Diagnosis not present

## 2023-08-06 ENCOUNTER — Encounter: Payer: Self-pay | Admitting: Gastroenterology

## 2023-09-07 NOTE — Progress Notes (Unsigned)
 Referring Provider: Vanita Gens, MD Primary Care Physician:  Vanita Gens, MD Primary GI Physician: Dr. Riley Cheadle  Chief Complaint  Patient presents with   Follow-up    Follow up. No problem     HPI:   Donald Klein is a 78 y.o. 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 He is presenting today for follow-up on GERD/chronic diarrhea.    Last seen in the office 03/06/23 . GERD well controlled on Aciphex  20 mg daily. Chronic diarrhea maybe slightly improved with changing Nexium  to Aciphex  Having 3-7 Bms per day. Using imodium as needed.   Today:  GERD: Doing well on Aciphex  20 mg daily.  No nausea, vomiting, dysphagia, abdominal pain.  Chronic Diarrhea:  Has improved since he has had some medication changes.  He is taking iron every day rather than 3 times a week and is also decreased Januvia from 100 mg to 50 mg daily.  Currently having 2-3 bowel movements per day.  Not urgent.  No BRBPR or melena.  Has not needed Imodium.  States his oncologist wants his last EGD report.  Past Medical History:  Diagnosis Date   Anemia    Anxiety    Barrett esophagus    Last EGD 05/27/08 Barrett's without dyplasia. EGD due 05/2011   Chronic back pain    Colitis    In 1980s, dx with UC by Dr. 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   Perceptive hearing loss, both sides    weras hearing aides both ears   Prostate cancer (HCC)    PTSD (post-traumatic stress disorder)    PTSD (post-traumatic stress disorder)    Pulmonary nodules chest ct 08-25-2019   stable per chest ct   Skin abnormality 05/31/2020   sore on right shin x 1 week no drainage healing wife putting neosporin on daily   Sleep apnea    no cpap did not tolerate cpap    Small bowel tumor    presented with SBO (distal-near TI), path showed spindle cell neoplasm 6.5cm   Tubular adenoma of colon 10/08/2010   Last colonosocpy 1 cecal TA   Wears glasses     Past Surgical History:  Procedure Laterality Date   APPENDECTOMY  July 2014   at time of small bowel resection   BIOPSY  01/06/2018   Procedure: BIOPSY;  Surgeon: Suzette Espy, MD;  [facility];  Service: Endoscopy;;  gastric biopsy , esophageal biopsy   BIOPSY  10/29/2018   Procedure: BIOPSY;  Surgeon: Suzette Espy, MD;  [facility];  Service: Endoscopy;;  gastric, esophageal   CARDIAC CATHETERIZATION  2001   saw cardiology for 1 year and released   CHOLECYSTECTOMY  1980's or 1990's   COLONOSCOPY  10/08/2010   Normal rectum/tubular adenoma/normal random biopsy. Next TCS due 10/2015.   COLONOSCOPY N/A 09/30/2014   Dr. Riley Cheadle: Redunant colon . Status post segmental biopsy and stool sampling. Negative stool samples and colonic biopsies.    COLONOSCOPY WITH PROPOFOL  N/A 10/29/2018   Surgeon: Suzette Espy, MD; markedly redundant colon with benign-appearing noncritical colonic stricture, inability to identify landmarks of cecum and ileocecal valve.  Apparently patient had virtual colonoscopy thereafter.  Dr. Riley Cheadle did not recommend repeating colonoscopy.   ESOPHAGOGASTRODUODENOSCOPY  07/31/2011   Dr.  Aminta Kales segment Barrett's esophagus s/p bx Hiatal hernia. Duodenal diverticulum. No dysplasia.Next EGD 07/2014.   ESOPHAGOGASTRODUODENOSCOPY N/A 09/30/2014   Dr. Riley Cheadle: Abnormal distal esophagus consistant with prior diagnosis of short segment Barrett's esophagus status post biopsy. 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 Hiatal hernia. Abnormal Gastric mucosa of uncertain significance status post gastric biopsy. path with +Barrett's esophagus but no dysplasia, mild chronic gastritis. Surveillance for Barrett's due in 2019   ESOPHAGOGASTRODUODENOSCOPY N/A 01/06/2018   Procedure: ESOPHAGOGASTRODUODENOSCOPY (EGD);  Surgeon: Suzette Espy, MD;  [facility];  Service: Endoscopy;  Laterality: N/A;  12:00pm   ESOPHAGOGASTRODUODENOSCOPY (EGD) WITH PROPOFOL  N/A 10/29/2018    Surgeon: Suzette Espy, MD;  appearance of Barrett's esophagus though biopsy with reactive/regenerative changes, moderate gastric mucosa with some deformity likely related to prior surgery s/p biopsy (chronic gastritis), normal examined duodenum.   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)   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 on 07/2015    PROSTATE BIOPSY  2021   RADIOACTIVE SEED IMPLANT N/A 06/02/2020   Procedure: RADIOACTIVE SEED IMPLANT/BRACHYTHERAPY IMPLANT;  Surgeon: Marco Severs, MD;  Location: [facility];  Service: Urology;  Laterality: N/A;   SPACE OAR INSTILLATION N/A 06/02/2020   Procedure: SPACE OAR INSTILLATION;  Surgeon: Marco Severs, MD;  Location: [facility];  Service: Urology;  Laterality: N/A;    Current  Outpatient Medications  Medication Sig Dispense Refill   alfuzosin  (UROXATRAL ) 10 MG 24 hr tablet TAKE 1 TABLET(10 MG) BY MOUTH DAILY WITH BREAKFAST 30 tablet 11   alfuzosin  (UROXATRAL ) 10 MG 24 hr tablet Take 1 tablet (10 mg total) by mouth at bedtime. 30 tablet 11   Artificial Tear Ointment (DRY EYES OP) Place 1 drop into both eyes 2 (two) times daily as needed (dry eyes).      aspirin 81 MG EC tablet      CALCIUM PO Take 1 tablet by mouth 2 (two) times daily.     Cholecalciferol 25 MCG (1000 UT) tablet Take by mouth daily.     ferrous sulfate 325 (65 FE) MG tablet Take 325 mg by mouth. Taking iron every day     imatinib (GLEEVEC) 400 MG tablet Take 1 tablet by mouth at bedtime.     JANUVIA 100 MG tablet Take 50 mg by mouth daily. 50 mg     latanoprost (XALATAN) 0.005 % ophthalmic solution SMARTSIG:In Eye(s)  loperamide (IMODIUM) 2 MG capsule Take by mouth as needed for diarrhea or loose stools.     losartan (COZAAR) 100 MG tablet Take 100 mg by mouth daily.     LOVAZA 1 G capsule Take 1 capsule by mouth 2 (two) times daily.      Magnesium  400 MG CAPS Take by mouth daily.     metoprolol (LOPRESSOR) 50 MG tablet Take 50-75 mg by mouth 2 (two) times daily. 50mg  in am and 75mg  at night     mirabegron  ER (MYRBETRIQ ) 25 MG TB24 tablet Take 1 tablet (25 mg total) by mouth daily.     Multiple Vitamins-Minerals (MULTIVITAMIN WITH MINERALS) tablet Take 1 tablet by mouth daily. Centrum adult 50 plus     neomycin-bacitracin-polymyxin (NEOSPORIN) ointment Apply 1 application topically every 12 (twelve) hours. To right shin daily     nitroGLYCERIN (NITROSTAT) 0.4 MG SL tablet Place 0.4 mg under the tongue every 5 (five) minutes as needed for chest pain.     NON FORMULARY Eye drop lantaprost 125 mg 1 drop both eyes at hs     rosuvastatin (CRESTOR) 10 MG tablet Take 10 mg by mouth daily.      tadalafil  (CIALIS ) 20 MG tablet Take 1 tablet (20 mg total) by mouth daily as needed. 10 tablet 5   vitamin C  (ASCORBIC ACID) 250 MG tablet Take 250 mg by mouth daily.     RABEprazole  (ACIPHEX ) 20 MG tablet Take 1 tablet (20 mg total) by mouth daily. 90 tablet 3   No current facility-administered medications for this visit.    Allergies as of 09/10/2023 - Review Complete 09/10/2023  Allergen Reaction Noted   Lisinopril Other (See Comments) 12/29/2017   Metformin Diarrhea 03/05/2019   Enoxaparin Rash 01/01/2013   Lovenox [enoxaparin sodium] Hives and Rash 09/30/2014   Penicillins Rash     Family History  Problem Relation Age of Onset   Heart disease Mother    Suicidality Mother    Heart attack Father        68   Heart disease Father    Heart disease Sister    Prostate cancer Brother    Colon cancer Neg Hx    Breast cancer Neg Hx    Pancreatic cancer Neg Hx     Social History   Socioeconomic History   Marital status: Married    Spouse name: Not on file   Number of children: 1   Years of education: Not on file   Highest education level: Not on file  Occupational History   Occupation: disability  Tobacco Use   Smoking status: Never   Smokeless tobacco: Never  Vaping Use   Vaping status: Never Used  Substance and Sexual Activity   Alcohol use: No    Alcohol/week: 0.0 standard drinks of alcohol   Drug use: No   Sexual activity: Yes    Partners: Female    Birth control/protection: None    Comment: spouse  Other Topics Concern   Not on file  Social History Narrative   Not on file   Social Drivers of Health   Financial Resource Strain: Not on file  Food Insecurity: Not on file  Transportation Needs: Not on file  Physical Activity: Not on file  Stress: Not on file  Social Connections: Not on file    Review of Systems: Gen: Denies fever, chills, cold or flulike symptoms, presyncope, syncope. GI: See HPI Heme: See HPI  Physical Exam: BP 137/69 (BP Location: Left  Arm, Patient Position: Sitting, Cuff Size: Large)   Pulse 64   Temp 97.7 F (36.5 C) (Temporal)    Ht 5\' 9"  (1.753 m)   Wt 173 lb (78.5 kg)   BMI 25.55 kg/m  General:   Alert and oriented. No distress noted. Pleasant and cooperative.  Head:  Normocephalic and atraumatic. Eyes:  Conjuctiva clear without scleral icterus. Abdomen:  +BS, soft, non-tender and non-distended. No rebound or guarding. No HSM or masses noted. Msk:  Symmetrical without gross deformities. Normal posture. Neurologic:  Alert and  oriented x4 Psych:  Normal mood and affect.    Assessment:  78 y.o. 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 He is presenting today for follow-up on GERD/chronic diarrhea.   GERD: Well-controlled on Aciphex  20 mg daily.  Chronic diarrhea: Much improved since medication changes including taking iron daily rather than 3 times a week and decreasing Januvia from 100 mg to 50 daily. No longer needing imodium.    Plan:  Continue Aciphex  20 mg daily. Will send last EGD report to Dr. Farrell Honey with [facility] oncology as patient states this has been requested.  Follow-up in 6 months.    Shana Daring, PA-C [facility] 09/10/2023

## 2023-09-09 DIAGNOSIS — N1831 Chronic kidney disease, stage 3a: Secondary | ICD-10-CM | POA: Diagnosis not present

## 2023-09-09 DIAGNOSIS — I10 Essential (primary) hypertension: Secondary | ICD-10-CM | POA: Diagnosis not present

## 2023-09-09 DIAGNOSIS — E1122 Type 2 diabetes mellitus with diabetic chronic kidney disease: Secondary | ICD-10-CM | POA: Diagnosis not present

## 2023-09-09 DIAGNOSIS — R809 Proteinuria, unspecified: Secondary | ICD-10-CM | POA: Diagnosis not present

## 2023-09-10 ENCOUNTER — Ambulatory Visit (INDEPENDENT_AMBULATORY_CARE_PROVIDER_SITE_OTHER): Admitting: Gastroenterology

## 2023-09-10 ENCOUNTER — Encounter: Payer: Self-pay | Admitting: Gastroenterology

## 2023-09-10 VITALS — BP 137/69 | HR 64 | Temp 97.7°F | Ht 69.0 in | Wt 173.0 lb

## 2023-09-10 DIAGNOSIS — K219 Gastro-esophageal reflux disease without esophagitis: Secondary | ICD-10-CM

## 2023-09-10 DIAGNOSIS — K529 Noninfective gastroenteritis and colitis, unspecified: Secondary | ICD-10-CM | POA: Diagnosis not present

## 2023-09-10 MED ORDER — RABEPRAZOLE SODIUM 20 MG PO TBEC: 20.0000 mg | DELAYED_RELEASE_TABLET | Freq: Every day | ORAL | 3 refills | Status: AC

## 2023-09-10 NOTE — Patient Instructions (Signed)
 Continue Aciphex  20 mg daily.  We will send your last upper endoscopy report to Dr. Farrell Honey at [facility].  Will follow-up with you in 6 months or sooner if needed.  Shana Daring, PA-C [facility]

## 2023-10-07 DIAGNOSIS — C49A4 Gastrointestinal stromal tumor of large intestine: Secondary | ICD-10-CM | POA: Diagnosis not present

## 2023-11-18 DIAGNOSIS — C49A4 Gastrointestinal stromal tumor of large intestine: Secondary | ICD-10-CM | POA: Diagnosis not present

## 2023-11-28 DIAGNOSIS — C49A4 Gastrointestinal stromal tumor of large intestine: Secondary | ICD-10-CM | POA: Diagnosis not present

## 2023-12-25 ENCOUNTER — Ambulatory Visit (INDEPENDENT_AMBULATORY_CARE_PROVIDER_SITE_OTHER): Admitting: Orthopaedic Surgery

## 2023-12-25 ENCOUNTER — Other Ambulatory Visit: Payer: Self-pay | Admitting: Orthopaedic Surgery

## 2023-12-25 ENCOUNTER — Other Ambulatory Visit (INDEPENDENT_AMBULATORY_CARE_PROVIDER_SITE_OTHER): Payer: Self-pay

## 2023-12-25 ENCOUNTER — Encounter: Payer: Self-pay | Admitting: Orthopaedic Surgery

## 2023-12-25 VITALS — BP 154/74 | HR 64 | Ht 69.0 in | Wt 168.0 lb

## 2023-12-25 DIAGNOSIS — M542 Cervicalgia: Secondary | ICD-10-CM | POA: Diagnosis not present

## 2023-12-25 DIAGNOSIS — M792 Neuralgia and neuritis, unspecified: Secondary | ICD-10-CM

## 2023-12-25 MED ORDER — HYDROCODONE-ACETAMINOPHEN 5-325 MG PO TABS: ORAL_TABLET | ORAL | 0 refills | Status: DC

## 2023-12-25 MED ORDER — NAPROXEN 500 MG PO TABS: 500.0000 mg | ORAL_TABLET | Freq: Two times a day (BID) | ORAL | 5 refills | Status: DC

## 2023-12-25 NOTE — Patient Instructions (Signed)
 While we are working on your approval for MRI please go ahead and call to schedule your appointment with [facility] within at least one (1) week.   Central Scheduling [phone]

## 2023-12-25 NOTE — Progress Notes (Signed)
 Subjective:    Patient ID: Donald Klein, male    DOB: [birth year], 78 y.o.   MRN: [id number]  HPI He has had neck pain, right shoulder and right arm pain since November 07, 2023.  He has no injury.  He has seen [facility] and has had prednisone with no help.  Advil and tylenol  does not help.  He is not getting any better.  He has numbness to the right deltoid area and to the right upper arm and some to the right thumb area at times.     Review of Systems  Constitutional:  Positive for activity change.  Musculoskeletal:  Positive for arthralgias, myalgias and neck pain.  All other systems reviewed and are negative. For Review of Systems, all other systems reviewed and are negative.  The following is a summary of the past history medically, past history surgically, known current medicines, social history and family history.  This information is gathered electronically by the computer from prior information and documentation.  I review this each visit and have found including this information at this point in the chart is beneficial and informative.   Past Medical History:  Diagnosis Date   Anemia    Anxiety    Barrett esophagus    Last EGD 05/27/08 Barrett's without dyplasia. EGD due 05/2011   Chronic back pain    Colitis    In 1980s, dx with UC by Dr. 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   Perceptive hearing loss, both sides    weras hearing aides both ears   Prostate cancer (HCC)    PTSD  (post-traumatic stress disorder)    PTSD (post-traumatic stress disorder)    Pulmonary nodules chest ct 08-25-2019   stable per chest ct   Skin abnormality 05/31/2020   sore on right shin x 1 week no drainage healing wife putting neosporin on daily   Sleep apnea    no cpap did not tolerate cpap    Small bowel tumor    presented with SBO (distal-near TI), path showed spindle cell neoplasm 6.5cm   Tubular adenoma of colon 10/08/2010   Last colonosocpy 1 cecal TA   Wears glasses     Past Surgical History:  Procedure Laterality Date   APPENDECTOMY  July 2014   at time of small bowel resection   BIOPSY  01/06/2018   Procedure: BIOPSY;  Surgeon: Shaaron Lamar HERO, MD;  [facility];  Service: Endoscopy;;  gastric biopsy , esophageal biopsy   BIOPSY  10/29/2018   Procedure: BIOPSY;  Surgeon: Shaaron Lamar HERO, MD;  [facility];  Service: Endoscopy;;  gastric, esophageal   CARDIAC CATHETERIZATION  2001   saw cardiology for 1 year and released   CHOLECYSTECTOMY  1980's or 1990's   COLONOSCOPY  10/08/2010   Normal rectum/tubular adenoma/normal random biopsy. Next TCS  due 10/2015.   COLONOSCOPY N/A 09/30/2014   Dr. Shaaron: Redunant colon . Status post segmental biopsy and stool sampling. Negative stool samples and colonic biopsies.    COLONOSCOPY WITH PROPOFOL  N/A 10/29/2018   Surgeon: Shaaron Lamar HERO, MD; markedly redundant colon with benign-appearing noncritical colonic stricture, inability to identify landmarks of cecum and ileocecal valve.  Apparently patient had virtual colonoscopy thereafter.  Dr. Shaaron did not recommend repeating colonoscopy.   ESOPHAGOGASTRODUODENOSCOPY  07/31/2011   Dr. Elnor segment Barrett's esophagus s/p bx Hiatal hernia. Duodenal diverticulum. No dysplasia.Next EGD 07/2014.   ESOPHAGOGASTRODUODENOSCOPY N/A 09/30/2014   Dr. Shaaron: Abnormal distal esophagus consistant with prior diagnosis of short segment Barrett's esophagus status post biopsy.  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 Hiatal hernia. Abnormal Gastric mucosa of uncertain significance status post gastric biopsy. path with +Barrett's esophagus but no dysplasia, mild chronic gastritis. Surveillance for Barrett's due in 2019   ESOPHAGOGASTRODUODENOSCOPY N/A 01/06/2018   Procedure: ESOPHAGOGASTRODUODENOSCOPY (EGD);  Surgeon: Shaaron Lamar HERO, MD;  [facility];  Service: Endoscopy;  Laterality: N/A;  12:00pm   ESOPHAGOGASTRODUODENOSCOPY (EGD) WITH PROPOFOL  N/A 10/29/2018    Surgeon: Shaaron Lamar HERO, MD;  appearance of Barrett's esophagus though biopsy with reactive/regenerative changes, moderate gastric mucosa with some deformity likely related to prior surgery s/p biopsy (chronic gastritis), normal examined duodenum.   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)   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 on 07/2015    PROSTATE BIOPSY  2021   RADIOACTIVE SEED IMPLANT N/A 06/02/2020   Procedure: RADIOACTIVE SEED IMPLANT/BRACHYTHERAPY IMPLANT;  Surgeon: Sherrilee Belvie CROME, MD;  Location[facility];  Service: Urology;  Laterality: N/A;   SPACE OAR INSTILLATION N/A 06/02/2020   Procedure: SPACE OAR INSTILLATION;  Surgeon: Sherrilee Belvie CROME, MD;  Location: [facility];  Service: Urology;  Laterality: N/A;    Current Outpatient Medications on File Prior to Visit  Medication Sig Dispense Refill   alfuzosin  (UROXATRAL ) 10 MG 24 hr tablet TAKE 1 TABLET(10 MG) BY MOUTH DAILY WITH BREAKFAST 30 tablet 11   alfuzosin  (UROXATRAL ) 10 MG 24 hr tablet Take 1 tablet (10 mg total) by mouth at bedtime. 30 tablet 11    amLODipine (NORVASC) 5 MG tablet Take 5 mg by mouth 2 (two) times daily.     Artificial Tear Ointment (DRY EYES OP) Place 1 drop into both eyes 2 (two) times daily as needed (dry eyes).      aspirin 81 MG EC tablet      baclofen (LIORESAL) 10 MG tablet Take by mouth.     CALCIUM PO Take 1 tablet by mouth 2 (two) times daily.     Cholecalciferol 25 MCG (1000 UT) tablet Take by mouth daily.     ferrous sulfate 325 (65 FE) MG tablet Take 325 mg by mouth. Taking iron every day     imatinib (GLEEVEC) 400 MG tablet Take 1 tablet by mouth at bedtime.     JANUVIA 100 MG tablet Take 50 mg by mouth daily. 50 mg     latanoprost (XALATAN) 0.005 % ophthalmic solution SMARTSIG:In Eye(s)     loperamide (IMODIUM) 2 MG capsule Take by mouth as  needed for diarrhea or loose stools.     losartan (COZAAR) 100 MG tablet Take 100 mg by mouth daily.     LOVAZA 1 G capsule Take 1 capsule by mouth 2 (two) times daily.      Magnesium  400 MG CAPS Take by mouth daily.     metoprolol (LOPRESSOR) 50 MG tablet Take 50-75 mg by mouth 2 (two) times daily. 50mg  in am and 75mg  at night     mirabegron  ER (MYRBETRIQ ) 25 MG TB24 tablet Take 1 tablet (25 mg total) by mouth daily.     Multiple Vitamins-Minerals (MULTIVITAMIN WITH MINERALS) tablet Take 1 tablet by mouth daily. Centrum adult 50 plus     neomycin-bacitracin-polymyxin (NEOSPORIN) ointment Apply 1 application topically every 12 (twelve) hours. To right shin daily     nitroGLYCERIN (NITROSTAT) 0.4 MG SL tablet Place 0.4 mg under the tongue every 5 (five) minutes as needed for chest pain.     NON FORMULARY Eye drop lantaprost 125 mg 1 drop both eyes at hs     RABEprazole  (ACIPHEX ) 20 MG tablet Take 1 tablet (20 mg total) by mouth daily. 90 tablet 3   rosuvastatin (CRESTOR) 10 MG tablet Take 10 mg by mouth daily.      tadalafil  (CIALIS ) 20 MG tablet Take 1 tablet (20 mg total) by mouth daily as needed. 10 tablet 5   vitamin C (ASCORBIC ACID) 250 MG tablet Take 250 mg by  mouth daily.     [DISCONTINUED] dicyclomine  (BENTYL ) 10 MG capsule Take 1 capsule (10 mg total) by mouth 4 (four) times daily as needed for spasms (diarrhea). (Patient not taking: Reported on 05/31/2020) 90 capsule 3   No current facility-administered medications on file prior to visit.    Social History   Socioeconomic History   Marital status: Married    Spouse name: Not on file   Number of children: 1   Years of education: Not on file   Highest education level: Not on file  Occupational History   Occupation: disability  Tobacco Use   Smoking status: Never   Smokeless tobacco: Never  Vaping Use   Vaping status: Never Used  Substance and Sexual Activity   Alcohol use: No    Alcohol/week: 0.0 standard drinks of alcohol   Drug use: No   Sexual activity: Yes    Partners: Female    Birth control/protection: None    Comment: spouse  Other Topics Concern   Not on file  Social History Narrative   Not on file   Social Drivers of Health   Financial Resource Strain: Not on file  Food Insecurity: Not on file  Transportation Needs: Not on file  Physical Activity: Not on file  Stress: Not on file  Social Connections: Not on file  Intimate Partner Violence: Not on file    Family History  Problem Relation Age of Onset   Heart disease Mother    Suicidality Mother    Heart attack Father        79   Heart disease Father    Heart disease Sister    Prostate cancer Brother    Colon cancer Neg Hx    Breast cancer Neg Hx    Pancreatic cancer Neg Hx     BP (!) 154/74   Pulse 64   Ht 5' 9 (1.753 m)   Wt 168 lb (76.2 kg)   BMI 24.81 kg/m   Body mass index is 24.81 kg/m.  Objective:   Physical Exam Vitals and nursing note reviewed. Exam conducted with a chaperone present.  Constitutional:      Appearance: He is well-developed.  HENT:     Head: Normocephalic and atraumatic.  Eyes:     Conjunctiva/sclera: Conjunctivae normal.     Pupils: Pupils are equal,  round, and reactive to light.  Cardiovascular:     Rate and Rhythm: Normal rate and regular rhythm.  Pulmonary:     Effort: Pulmonary effort is normal.  Abdominal:     Palpations: Abdomen is soft.  Musculoskeletal:       Arms:     Cervical back: Normal range of motion and neck supple.  Skin:    General: Skin is warm and dry.  Neurological:     Mental Status: He is alert and oriented to person, place, and time.     Cranial Nerves: No cranial nerve deficit.     Motor: No abnormal muscle tone.     Coordination: Coordination normal.     Deep Tendon Reflexes: Reflexes are normal and symmetric. Reflexes normal.  Psychiatric:        Behavior: Behavior normal.        Thought Content: Thought content normal.        Judgment: Judgment normal.   X-rays were done of the cervical spine, reported separately.        Assessment & Plan:   Encounter Diagnoses  Name Primary?   Neck pain Yes   Radicular pain of right upper extremity    I had originally given Rx for Naprosyn  but I see he has Barrett's esophagus problems.  I have called pharmacy to cancel this.  I have reviewed the   Controlled Substance Reporting System web site prior to prescribing narcotic medicine for this patient.  I have scheduled a MRI of the cervical spine as I am concerned about a HNP at the C6-C7 area.  Return in two weeks.  Call if any problem.  Precautions discussed.  Electronically Signed Lemond Stable, MD 8/21/20253:22 PM

## 2023-12-31 ENCOUNTER — Ambulatory Visit (HOSPITAL_COMMUNITY)
Admission: RE | Admit: 2023-12-31 | Discharge: 2023-12-31 | Disposition: A | Discharge status: Home / Self Care | Source: Ambulatory Visit | Attending: Orthopaedic Surgery | Admitting: Orthopaedic Surgery

## 2023-12-31 DIAGNOSIS — M542 Cervicalgia: Secondary | ICD-10-CM | POA: Insufficient documentation

## 2023-12-31 DIAGNOSIS — M50923 Unspecified cervical disc disorder at C6-C7 level: Secondary | ICD-10-CM | POA: Diagnosis not present

## 2023-12-31 DIAGNOSIS — M47812 Spondylosis without myelopathy or radiculopathy, cervical region: Secondary | ICD-10-CM | POA: Diagnosis not present

## 2023-12-31 DIAGNOSIS — M792 Neuralgia and neuritis, unspecified: Secondary | ICD-10-CM | POA: Diagnosis not present

## 2024-01-07 ENCOUNTER — Ambulatory Visit (INDEPENDENT_AMBULATORY_CARE_PROVIDER_SITE_OTHER): Admitting: Orthopaedic Surgery

## 2024-01-07 ENCOUNTER — Encounter: Payer: Self-pay | Admitting: Orthopaedic Surgery

## 2024-01-07 DIAGNOSIS — M792 Neuralgia and neuritis, unspecified: Secondary | ICD-10-CM

## 2024-01-07 DIAGNOSIS — M542 Cervicalgia: Secondary | ICD-10-CM

## 2024-01-07 NOTE — Progress Notes (Signed)
 I still have some pain.  He has more right upper trapezius and medial right scapular pain now.  He has no numbness.  His pain is worse after a while after getting up in the morning.  He sleeps well.    MRI showed:  IMPRESSION: Multilevel mild degenerative changes.   There is no disc herniation or spinal stenosis.  I have explained the findings to him.  I will begin PT at [facility].  I have independently reviewed the MRI.    Neck has full ROM today as does the right shoulder without pain.  NV intact.  Encounter Diagnoses  Name Primary?   Neck pain Yes   Radicular pain of right upper extremity    Begin PT at [facility].  Return in three weeks.  He cannot take NSAIDs secondary to Barrett's esophagus problems.  Call if any problem.  Precautions discussed.  Electronically Signed Lemond Stable, MD 9/3/20252:01 PM

## 2024-01-08 DIAGNOSIS — C49A4 Gastrointestinal stromal tumor of large intestine: Secondary | ICD-10-CM | POA: Diagnosis not present

## 2024-01-08 DIAGNOSIS — N3289 Other specified disorders of bladder: Secondary | ICD-10-CM | POA: Diagnosis not present

## 2024-01-13 DIAGNOSIS — R293 Abnormal posture: Secondary | ICD-10-CM | POA: Diagnosis not present

## 2024-01-13 DIAGNOSIS — M542 Cervicalgia: Secondary | ICD-10-CM | POA: Diagnosis not present

## 2024-01-13 DIAGNOSIS — M6281 Muscle weakness (generalized): Secondary | ICD-10-CM | POA: Diagnosis not present

## 2024-01-13 DIAGNOSIS — M2560 Stiffness of unspecified joint, not elsewhere classified: Secondary | ICD-10-CM | POA: Diagnosis not present

## 2024-01-16 DIAGNOSIS — R293 Abnormal posture: Secondary | ICD-10-CM | POA: Diagnosis not present

## 2024-01-16 DIAGNOSIS — M542 Cervicalgia: Secondary | ICD-10-CM | POA: Diagnosis not present

## 2024-01-16 DIAGNOSIS — M6281 Muscle weakness (generalized): Secondary | ICD-10-CM | POA: Diagnosis not present

## 2024-01-16 DIAGNOSIS — M2560 Stiffness of unspecified joint, not elsewhere classified: Secondary | ICD-10-CM | POA: Diagnosis not present

## 2024-01-20 DIAGNOSIS — R293 Abnormal posture: Secondary | ICD-10-CM | POA: Diagnosis not present

## 2024-01-20 DIAGNOSIS — M542 Cervicalgia: Secondary | ICD-10-CM | POA: Diagnosis not present

## 2024-01-20 DIAGNOSIS — M2560 Stiffness of unspecified joint, not elsewhere classified: Secondary | ICD-10-CM | POA: Diagnosis not present

## 2024-01-20 DIAGNOSIS — M6281 Muscle weakness (generalized): Secondary | ICD-10-CM | POA: Diagnosis not present

## 2024-01-22 DIAGNOSIS — M6281 Muscle weakness (generalized): Secondary | ICD-10-CM | POA: Diagnosis not present

## 2024-01-22 DIAGNOSIS — M2560 Stiffness of unspecified joint, not elsewhere classified: Secondary | ICD-10-CM | POA: Diagnosis not present

## 2024-01-22 DIAGNOSIS — R293 Abnormal posture: Secondary | ICD-10-CM | POA: Diagnosis not present

## 2024-01-22 DIAGNOSIS — M542 Cervicalgia: Secondary | ICD-10-CM | POA: Diagnosis not present

## 2024-01-28 ENCOUNTER — Ambulatory Visit: Admitting: Orthopaedic Surgery

## 2024-02-03 DIAGNOSIS — M2560 Stiffness of unspecified joint, not elsewhere classified: Secondary | ICD-10-CM | POA: Diagnosis not present

## 2024-02-03 DIAGNOSIS — M542 Cervicalgia: Secondary | ICD-10-CM | POA: Diagnosis not present

## 2024-02-03 DIAGNOSIS — R293 Abnormal posture: Secondary | ICD-10-CM | POA: Diagnosis not present

## 2024-02-03 DIAGNOSIS — M6281 Muscle weakness (generalized): Secondary | ICD-10-CM | POA: Diagnosis not present

## 2024-02-05 DIAGNOSIS — M542 Cervicalgia: Secondary | ICD-10-CM | POA: Diagnosis not present

## 2024-02-05 DIAGNOSIS — M2560 Stiffness of unspecified joint, not elsewhere classified: Secondary | ICD-10-CM | POA: Diagnosis not present

## 2024-02-05 DIAGNOSIS — M6281 Muscle weakness (generalized): Secondary | ICD-10-CM | POA: Diagnosis not present

## 2024-02-05 DIAGNOSIS — R293 Abnormal posture: Secondary | ICD-10-CM | POA: Diagnosis not present

## 2024-02-10 DIAGNOSIS — M542 Cervicalgia: Secondary | ICD-10-CM | POA: Diagnosis not present

## 2024-02-10 DIAGNOSIS — M6281 Muscle weakness (generalized): Secondary | ICD-10-CM | POA: Diagnosis not present

## 2024-02-10 DIAGNOSIS — M2560 Stiffness of unspecified joint, not elsewhere classified: Secondary | ICD-10-CM | POA: Diagnosis not present

## 2024-02-10 DIAGNOSIS — R293 Abnormal posture: Secondary | ICD-10-CM | POA: Diagnosis not present

## 2024-02-12 DIAGNOSIS — R293 Abnormal posture: Secondary | ICD-10-CM | POA: Diagnosis not present

## 2024-02-12 DIAGNOSIS — M542 Cervicalgia: Secondary | ICD-10-CM | POA: Diagnosis not present

## 2024-02-12 DIAGNOSIS — M2560 Stiffness of unspecified joint, not elsewhere classified: Secondary | ICD-10-CM | POA: Diagnosis not present

## 2024-02-12 DIAGNOSIS — M6281 Muscle weakness (generalized): Secondary | ICD-10-CM | POA: Diagnosis not present

## 2024-02-17 DIAGNOSIS — I10 Essential (primary) hypertension: Secondary | ICD-10-CM | POA: Diagnosis not present

## 2024-02-17 DIAGNOSIS — R809 Proteinuria, unspecified: Secondary | ICD-10-CM | POA: Diagnosis not present

## 2024-02-17 DIAGNOSIS — M542 Cervicalgia: Secondary | ICD-10-CM | POA: Diagnosis not present

## 2024-02-17 DIAGNOSIS — E1122 Type 2 diabetes mellitus with diabetic chronic kidney disease: Secondary | ICD-10-CM | POA: Diagnosis not present

## 2024-02-17 DIAGNOSIS — R293 Abnormal posture: Secondary | ICD-10-CM | POA: Diagnosis not present

## 2024-02-17 DIAGNOSIS — N1831 Chronic kidney disease, stage 3a: Secondary | ICD-10-CM | POA: Diagnosis not present

## 2024-02-17 DIAGNOSIS — M6281 Muscle weakness (generalized): Secondary | ICD-10-CM | POA: Diagnosis not present

## 2024-02-17 DIAGNOSIS — M2560 Stiffness of unspecified joint, not elsewhere classified: Secondary | ICD-10-CM | POA: Diagnosis not present

## 2024-02-18 DIAGNOSIS — E1140 Type 2 diabetes mellitus with diabetic neuropathy, unspecified: Secondary | ICD-10-CM | POA: Insufficient documentation

## 2024-02-18 DIAGNOSIS — M797 Fibromyalgia: Secondary | ICD-10-CM | POA: Insufficient documentation

## 2024-02-18 DIAGNOSIS — Z9049 Acquired absence of other specified parts of digestive tract: Secondary | ICD-10-CM | POA: Insufficient documentation

## 2024-02-18 DIAGNOSIS — I517 Cardiomegaly: Secondary | ICD-10-CM | POA: Insufficient documentation

## 2024-02-18 DIAGNOSIS — K760 Fatty (change of) liver, not elsewhere classified: Secondary | ICD-10-CM | POA: Insufficient documentation

## 2024-02-18 DIAGNOSIS — H409 Unspecified glaucoma: Secondary | ICD-10-CM | POA: Insufficient documentation

## 2024-02-18 DIAGNOSIS — E559 Vitamin D deficiency, unspecified: Secondary | ICD-10-CM | POA: Insufficient documentation

## 2024-02-18 DIAGNOSIS — M5416 Radiculopathy, lumbar region: Secondary | ICD-10-CM | POA: Insufficient documentation

## 2024-02-18 DIAGNOSIS — M545 Low back pain, unspecified: Secondary | ICD-10-CM | POA: Insufficient documentation

## 2024-02-18 DIAGNOSIS — I8000 Phlebitis and thrombophlebitis of superficial vessels of unspecified lower extremity: Secondary | ICD-10-CM | POA: Insufficient documentation

## 2024-02-18 DIAGNOSIS — E786 Lipoprotein deficiency: Secondary | ICD-10-CM | POA: Insufficient documentation

## 2024-02-19 ENCOUNTER — Encounter: Payer: Self-pay | Admitting: Gastroenterology

## 2024-02-19 DIAGNOSIS — M6281 Muscle weakness (generalized): Secondary | ICD-10-CM | POA: Diagnosis not present

## 2024-02-19 DIAGNOSIS — M2560 Stiffness of unspecified joint, not elsewhere classified: Secondary | ICD-10-CM | POA: Diagnosis not present

## 2024-02-19 DIAGNOSIS — M542 Cervicalgia: Secondary | ICD-10-CM | POA: Diagnosis not present

## 2024-02-19 DIAGNOSIS — R293 Abnormal posture: Secondary | ICD-10-CM | POA: Diagnosis not present

## 2024-02-23 ENCOUNTER — Encounter: Payer: Self-pay | Admitting: Orthopedic Surgery

## 2024-02-23 ENCOUNTER — Ambulatory Visit: Admitting: Orthopedic Surgery

## 2024-02-23 VITALS — Ht 69.0 in | Wt 168.0 lb

## 2024-02-23 DIAGNOSIS — M542 Cervicalgia: Secondary | ICD-10-CM | POA: Diagnosis not present

## 2024-02-23 DIAGNOSIS — M792 Neuralgia and neuritis, unspecified: Secondary | ICD-10-CM | POA: Diagnosis not present

## 2024-02-23 NOTE — Progress Notes (Addendum)
    02/23/2024   Chief Complaint  Patient presents with   Neck Pain    Encounter Diagnoses  Name Primary?   Neck pain Yes   Radicular pain of right upper extremity     What pharmacy do you use ? ____[facility] _______________________  DOI/DOS/ Date:     Taking over care for patient of Dr. Brenna  78 year old male right arm pain radicular in nature  Previously treated with steroids and hydrocodone  due to Barrett's esophagitis cannot take NSAIDs did try a steroid Dosepak  Due to the persistent pain in the arm and little improvement with physical therapy   MRI shows mild foraminal stenosis bilaterally at C6-7 and then various minor disc irregularities and facet arthritis   I recommend he get 3 ESI's if no improvement we can refer him to neurosurgery for definitive management  I would not continue the opioids

## 2024-02-23 NOTE — Progress Notes (Signed)
    02/23/2024   Chief Complaint  Patient presents with   Neck Pain    No diagnosis found.  What pharmacy do you use ? ____[facility] _______________________  DOI/DOS/ Date:    A little better/ going for therapy at [facility]

## 2024-02-23 NOTE — Patient Instructions (Addendum)
 Continue [facility] at [facility]  Here is some things that can help with your neck pain  Use a heating pad for 30 minutes  Use one of the topical medications listed below  -Aspercreme -Biofreeze -Capsaicin cream  We are referring you to [facility] from [employer] address is [address] The phone number is [phone]  The office will call you with an appointment Dr. Eldonna

## 2024-02-24 ENCOUNTER — Encounter: Payer: Self-pay | Admitting: Physical Medicine and Rehabilitation

## 2024-02-24 ENCOUNTER — Ambulatory Visit (INDEPENDENT_AMBULATORY_CARE_PROVIDER_SITE_OTHER): Admitting: Physical Medicine and Rehabilitation

## 2024-02-24 DIAGNOSIS — M4802 Spinal stenosis, cervical region: Secondary | ICD-10-CM

## 2024-02-24 DIAGNOSIS — M5412 Radiculopathy, cervical region: Secondary | ICD-10-CM | POA: Diagnosis not present

## 2024-02-24 DIAGNOSIS — M47812 Spondylosis without myelopathy or radiculopathy, cervical region: Secondary | ICD-10-CM

## 2024-02-24 NOTE — Progress Notes (Signed)
 Donald Klein - 78 y.o. male MRN [id number]  Date of birth: 1945/10/27  Office Visit Note: Visit Date: 02/24/2024 PCP: Luke Agent, MD (Inactive) Referred by: Margrette Taft BRAVO, MD  Subjective: Chief Complaint  Patient presents with   Neck - Pain   HPI: Donald Klein is a 78 y.o. male who comes in today per the request of Dr. Taft Margrette for evaluation of chronic, worsening and severe bilateral neck pain radiating to shoulder and down right arm to hand. Also reports numbness/tingling to right arm/hand. Pain ongoing since July. His pain worsens with sitting and side to side rotation of neck. He describes pain as sore and throbbing sensation, currently rates as 8 out of 10. Some relief of pain with home exercise regimen, rest and use of medications. Minimal relief of pain with recent course of formal physical therapy at [facility]. Recent cervical MRI imaging shows multi level facet arthropathy, there is bilateral foraminal stenosis at C6-C7. No high grade spinal canal stenosis noted. No history of cervical surgery/injections. History of lumbar epidural steroid injections several years ago at [facility] with significant relief of pain. He is retired from [employer], most of his care is at the [facility] in [place]. Patient denies focal weakness. No recent trauma or falls.   Patients course is complicated by diabetes mellitus, prostate cancer, anxiety, fibromyalgia and PTSD.      Review of Systems  Musculoskeletal:  Positive for neck pain.  Neurological:  Positive for tingling. Negative for focal weakness and weakness.  All other systems reviewed and are negative.  Otherwise per HPI.  Assessment & Plan: Visit Diagnoses:    ICD-10-CM   1. Radiculopathy, cervical region  M54.12 Ambulatory referral to Physical Medicine Rehab    2. Foraminal stenosis of cervical region  M48.02 Ambulatory referral to Physical Medicine Rehab    3. Facet arthropathy, cervical  M47.812 Ambulatory  referral to Physical Medicine Rehab       Plan: Findings:  Chronic, worsening and severe bilateral neck pain radiating to right shoulder and down arm to hand. Paresthesias to right upper extremity. Patient continues to have severe pain despite good conservative therapies such as formal physical therapy, home exercise regimen, rest and use of medications. Patients clinical presentation and exam are consistent with cervical radiculopathy. There is multi level facet arthropathy, bilateral foraminal narrowing at C6-C7. No high grade spinal canal stenosis. We discussed treatment plan. Next step is to place order for diagnostic and hopefully therapeutic right C7-T1 interlaminar epidural steroid injection under fluoroscopic guidance. If good relief of pain with injection we can repeat this procedure infrequently as needed. I explained to patient that we do not complete series of 3 injections as that is not current evidence based practice. We will see him in follow up to evaluate pain relief post injection. His exam today is non focal, good strength noted to bilateral upper extremities.     Meds & Orders: No orders of the defined types were placed in this encounter.   Orders Placed This Encounter  Procedures   Ambulatory referral to Physical Medicine Rehab    Follow-up: Return for Right C7-T1 interlaminar epidural steroid injection.   Procedures: No procedures performed      Clinical History: Narrative & Impression CLINICAL DATA:  Neck pain   EXAM: MRI CERVICAL SPINE WITHOUT CONTRAST   TECHNIQUE: Multiplanar, multisequence MR imaging of the cervical spine was performed. No intravenous contrast was administered.   COMPARISON:  None Available.   FINDINGS: The craniocervical junction  is normal.   There is no significant bone marrow signal abnormality.   The cervical spinal cord is normal.   C2-C3: The disc is normal. There is facet arthropathy on the right. No spinal stenosis or  foraminal stenosis   C3-C4: Mild disc bulge. There is bilateral facet arthropathy, worse on the right. No spinal stenosis or foraminal stenosis   C4-C5: The disc is normal.  No significant facet disease   C5-C6: The disc is normal.  No significant facet disease   C6-C7: There is mild degenerative disc disease with a mild disc bulge. There are small foraminal spurs, larger on the left. No significant facet disease. There is mild bilateral neural foraminal stenosis   C7-T1: Normal   IMPRESSION: Multilevel mild degenerative changes.   There is no disc herniation or spinal stenosis.     Electronically Signed   By: Nancyann Burns M.D.   On: 01/07/2024 08:56   He reports that he has never smoked. He has never used smokeless tobacco. No results for input(s): HGBA1C, LABURIC in the last 8760 hours.  Objective:  VS:  HT:    WT:   BMI:     BP:   HR: bpm  TEMP: ( )  RESP:  Physical Exam Vitals and nursing note reviewed.  HENT:     Head: Normocephalic and atraumatic.     Right Ear: External ear normal.     Left Ear: External ear normal.     Nose: Nose normal.     Mouth/Throat:     Mouth: Mucous membranes are moist.  Eyes:     Extraocular Movements: Extraocular movements intact.  Cardiovascular:     Rate and Rhythm: Normal rate.     Pulses: Normal pulses.  Pulmonary:     Effort: Pulmonary effort is normal.  Abdominal:     General: Abdomen is flat. There is no distension.  Musculoskeletal:        General: Tenderness present.     Cervical back: Tenderness present.     Comments: Discomfort noted with flexion and extension of neck. Patient has good strength in the upper extremities including 5 out of 5 strength in wrist extension, long finger flexion and APB. Shoulder range of motion is full bilaterally without any sign of impingement. There is no atrophy of the hands intrinsically. Sensation intact bilaterally. Negative Hoffman's sign. Negative Spurling's sign.     Skin:     General: Skin is warm and dry.     Capillary Refill: Capillary refill takes less than 2 seconds.  Neurological:     General: No focal deficit present.     Mental Status: He is alert and oriented to person, place, and time.  Psychiatric:        Mood and Affect: Mood normal.        Behavior: Behavior normal.     Ortho Exam  Imaging: No results found.  Past Medical/Family/Surgical/Social History: Medications & Allergies reviewed per EMR, new medications updated. 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   History of cholecystectomy 02/18/2024   Left ventricular hypertrophy 02/18/2024   Radiculopathy, lumbar region 02/18/2024   Superficial thrombophlebitis of lower extremity 02/18/2024   Steatosis of liver 02/18/2024   Type 2 diabetes mellitus with diabetic neuropathy, unspecified (HCC) 02/18/2024   Vitamin D deficiency 02/18/2024   Stage 3 chronic kidney disease (HCC) 02/23/2021  Erectile dysfunction due to arterial insufficiency 10/18/2020   Chronic diarrhea 08/10/2020   Type 2 diabetes mellitus without complications (HCC) 07/26/2020   Prostate cancer (HCC) 02/09/2020   Nocturia 11/22/2019   Benign prostatic hyperplasia with urinary obstruction 11/22/2019   Elevated PSA 11/22/2019   Disease of liver 06/23/2019   Edema 06/23/2019   Hyperglycemia due to type 2 diabetes mellitus (HCC) 06/23/2019   Rectal itching 12/17/2018   Malignant neoplasm of digestive system (HCC) 11/16/2018   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   Dark stools 08/10/2015   Fatigue 08/10/2015   Pancreatic lesion 06/07/2015   Mucosal abnormality of stomach    Abdominal fibromatosis 01/01/2013   Abdominal pain, epigastric 09/29/2012   Small bowel tumor 09/29/2012    Class: History of   Tubular adenoma of colon 10/08/2010    Diarrhea 08/21/2010   Colitis 08/21/2010   Barrett esophagus 08/21/2010   DM 08/16/2008   ANXIETY 08/16/2008   POSTTRAUMATIC STRESS DISORDER 08/16/2008   DEPRESSION 08/16/2008   HYPERTENSION 08/16/2008   GASTROESOPHAGEAL REFLUX DISEASE, CHRONIC 08/16/2008   BACK PAIN, CHRONIC 08/16/2008   Personal history of other diseases of digestive system 08/16/2008   Past Medical History:  Diagnosis Date   Anemia    Anxiety    Barrett esophagus    Last EGD 05/27/08 Barrett's without dyplasia. EGD due 05/2011   Chronic back pain    Colitis    In 1980s, dx with UC by Dr. 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   Perceptive hearing loss, both sides    weras hearing aides both ears   Prostate cancer (HCC)    PTSD (post-traumatic stress disorder)    PTSD (post-traumatic stress disorder)    Pulmonary nodules chest ct 08-25-2019   stable per chest ct   Skin abnormality 05/31/2020   sore on right shin x 1 week no drainage healing wife putting neosporin on daily   Sleep apnea    no cpap did not tolerate cpap    Small bowel tumor    presented with SBO (distal-near TI), path showed spindle cell neoplasm 6.5cm   Tubular adenoma of colon 10/08/2010   Last colonosocpy 1 cecal TA   Wears glasses    Family History  Problem Relation Age of Onset   Heart disease Mother    Suicidality Mother    Heart attack Father        14   Heart disease Father    Heart disease Sister    Prostate cancer Brother    Colon cancer Neg Hx     Breast cancer Neg Hx    Pancreatic cancer Neg Hx    Past Surgical History:  Procedure Laterality Date   APPENDECTOMY  July 2014   at time of small bowel resection   BIOPSY  01/06/2018   Procedure: BIOPSY;  Surgeon: Shaaron,  Lamar HERO, MD;  Location: [facility];  Service: Endoscopy;;  gastric biopsy , esophageal biopsy   BIOPSY  10/29/2018   Procedure: BIOPSY;  Surgeon: Shaaron Lamar HERO, MD;  Location: [facility];  Service: Endoscopy;;  gastric, esophageal   CARDIAC CATHETERIZATION  2001   saw cardiology for 1 year and released   CHOLECYSTECTOMY  1980's or 1990's   COLONOSCOPY  10/08/2010   Normal rectum/tubular adenoma/normal random biopsy. Next TCS due 10/2015.   COLONOSCOPY N/A 09/30/2014   Dr. Shaaron: Redunant colon . Status post segmental biopsy and stool sampling. Negative stool samples and colonic biopsies.    COLONOSCOPY WITH PROPOFOL  N/A 10/29/2018   Surgeon: Shaaron Lamar HERO, MD; markedly redundant colon with benign-appearing noncritical colonic stricture, inability to identify landmarks of cecum and ileocecal valve.  Apparently patient had virtual colonoscopy thereafter.  Dr. Shaaron did not recommend repeating colonoscopy.   ESOPHAGOGASTRODUODENOSCOPY  07/31/2011   Dr. Elnor segment Barrett's esophagus s/p bx Hiatal hernia. Duodenal diverticulum. No dysplasia.Next EGD 07/2014.   ESOPHAGOGASTRODUODENOSCOPY N/A 09/30/2014   Dr. Shaaron: Abnormal distal esophagus consistant with prior diagnosis of short segment Barrett's esophagus status post biopsy. 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 Hiatal hernia. Abnormal Gastric mucosa of uncertain significance status post gastric biopsy. path with +Barrett's esophagus but no dysplasia, mild chronic gastritis. Surveillance for Barrett's due in 2019   ESOPHAGOGASTRODUODENOSCOPY N/A 01/06/2018   Procedure: ESOPHAGOGASTRODUODENOSCOPY (EGD);  Surgeon: Shaaron Lamar HERO, MD;  Location: [facility];  Service: Endoscopy;  Laterality: N/A;   12:00pm   ESOPHAGOGASTRODUODENOSCOPY (EGD) WITH PROPOFOL  N/A 10/29/2018    Surgeon: Shaaron Lamar HERO, MD;  appearance of Barrett's esophagus though biopsy with reactive/regenerative changes, moderate gastric mucosa with some deformity likely related to prior surgery s/p biopsy (chronic gastritis), normal examined duodenum.   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)   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 on 07/2015    PROSTATE BIOPSY  2021   RADIOACTIVE SEED IMPLANT N/A 06/02/2020   Procedure: RADIOACTIVE SEED IMPLANT/BRACHYTHERAPY IMPLANT;  Surgeon: Sherrilee Belvie CROME, MD;  Location: [facility];  Service: Urology;  Laterality: N/A;   SPACE OAR INSTILLATION N/A 06/02/2020   Procedure: SPACE OAR INSTILLATION;  Surgeon: Sherrilee Belvie CROME, MD;  Location: [facility];  Service: Urology;  Laterality: N/A;   Social History   Occupational History   Occupation: disability  Tobacco Use   Smoking status: Never   Smokeless tobacco: Never  Vaping Use   Vaping status: Never Used  Substance and Sexual Activity   Alcohol use: No    Alcohol/week: 0.0 standard drinks of alcohol   Drug use: No   Sexual activity: Yes    Partners: Female    Birth control/protection: None    Comment: spouse

## 2024-02-24 NOTE — Progress Notes (Signed)
 Pain Scale   Average Pain 7 Patient advising he has chronic neck pain radiating to right shoulder.        +Driver, -BT, -Dye Allergies.

## 2024-03-12 ENCOUNTER — Encounter: Payer: Self-pay | Admitting: Gastroenterology

## 2024-03-12 ENCOUNTER — Ambulatory Visit: Admitting: Gastroenterology

## 2024-03-12 VITALS — BP 138/67 | HR 60 | Temp 98.1°F | Ht 69.0 in | Wt 166.2 lb

## 2024-03-12 DIAGNOSIS — K760 Fatty (change of) liver, not elsewhere classified: Secondary | ICD-10-CM

## 2024-03-12 DIAGNOSIS — K219 Gastro-esophageal reflux disease without esophagitis: Secondary | ICD-10-CM | POA: Diagnosis not present

## 2024-03-12 DIAGNOSIS — K2270 Barrett's esophagus without dysplasia: Secondary | ICD-10-CM

## 2024-03-12 DIAGNOSIS — C49A2 Gastrointestinal stromal tumor of stomach: Secondary | ICD-10-CM | POA: Diagnosis not present

## 2024-03-12 DIAGNOSIS — K529 Noninfective gastroenteritis and colitis, unspecified: Secondary | ICD-10-CM

## 2024-03-12 NOTE — Progress Notes (Signed)
 GI Office Note    Referring Provider: Luke Agent, MD Primary Care Physician:  Luke Agent, MD (Inactive)  Primary Gastroenterologist: Ozell Hollingshead, MD   Chief Complaint   Chief Complaint  Patient presents with   Follow-up    Doing well, no issues    History of Present Illness   Donald Klein is a 78 y.o. male presenting today for follow-up.  Patient last seen in May 2025.  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 He is presenting today for follow-up on GERD/chronic diarrhea.   Discussed the use of AI scribe software for clinical note transcription with the patient, who gave verbal consent to proceed.   He experiences chronic diarrhea with up to seven to eight bowel movements per day. He takes a Therapist, Occupational brand of Imodium daily, which reduces the frequency to one or two times a day, though it sometimes causes him to feel like he has both diarrhea and constipation at the same time. His stools are often black due to daily iron supplementation, increased from three times a week. No blood in stool, but occasional blood on tissue from frequent wiping. He has a history of questionable ulcerative colitis diagnosed in the 1980s, but subsequent colonoscopies have not shown any evidence of UC or other chronic colitis. His last colonoscopy in 2020 showed a narrowing of the colon at hepatic flexure and could not be completed. A virtual colonoscopy to follow showed no abnormalities.   He has lost about 7 pounds since 09/2023. He attributes this to decreased appetite and  difficulty eating full meals. Complains of early satiety. Eats two meals a day.  His diet includes vegetables and eggs, avoiding caffeine and dairy products trying to control his diarrhea and reflux.   He has a history of reflux, which is well-controlled with daily medication. He avoids caffeine and acidic foods to manage his symptoms.    Wt Readings from Last 18 Encounters:  03/12/24 166 lb 3.2 oz (75.4 kg)  02/23/24 168 lb (76.2 kg)  12/25/23 168 lb (76.2 kg)  09/10/23 173 lb (78.5 kg)  03/06/23 171 lb (77.6 kg)  11/21/22 175 lb 9.6 oz (79.7 kg)  04/23/22 170 lb (77.1 kg)  12/11/20 173 lb 6.4 oz (78.7 kg)  10/18/20 169 lb (76.7 kg)  08/10/20 174 lb 3.2 oz (79 kg)  07/19/20 167 lb (75.8 kg)  06/29/20 176 lb (79.8 kg)  06/03/20 170 lb (77.1 kg)  06/02/20 170 lb 11.2 oz (77.4 kg)  05/30/20 176 lb (79.8 kg)  03/08/20 177 lb (80.3 kg)  03/03/20 177 lb (80.3 kg)  02/09/20 170 lb (77.1 kg)    Prior Data   CT chest/abd/pelvis with contrast 01/08/2024: Thoracic inlet: Similar subcentimeter thyroid nodules . Chest wall/Axilla: Within normal limits. . Central airways: Patent. . Mediastinum/Hila: Mild thickening of the distal esophagus which can be seen in esophagitis. SABRA Heart: Heart size within normal limits. Small pericardial effusion. Aortic valve calcifications. Coronary artery calcifications. . Vessels: Aorta normal in caliber. No central pulmonary embolism. Thoracic aortic calcifications. . Lungs/Pleura: Bibasilar linear scarring/atelectasis. No pleural effusion.  . Liver: No suspicious  focal findings. . Gallbladder/Biliary: Cholecystectomy. SABRA Spleen: Similar thin but extensive perisplenic density that may reflect fluid or a soft tissue implant. . Pancreas: Unremarkable. . Adrenals: Similar bilateral adreniform thickening. . Kidneys: Left upper pole simple cyst.  . Peritoneum/Mesenteries/Extraperitoneum: Similar multiple peritoneal and serosal implants, many of which are marked  in PACS, with index lesions in the following locations: - Implant involving the greater curvature of stomach, transverse colon, pancreatic tail measuring 2.5 x 6.4 cm (301/119). - Jejunal implant measuring 1.4 cm with tethering of the left ureter and psoas muscle (301/158). - Implant along the hepatic flexure measuring 1.6 cm (301/144). - Implant which appears inseparable from the left hepatic lobe, gastric antrum, and hepatic flexure is similar in bulk (301/128). Similar perigastric node measuring 1.0 short axis (301/112) Similar periportal node measuring 1.0 cm short axis (301/119) Trace pelvic ascites.  . Gastrointestinal tract: Prior partial gastrectomy and small bowel resection. Multifocal colonic mural thickening of the colon, most pronounced involving the distal descending, sigmoid colon and rectum. No evidence of obstruction.  . Ureters: Unremarkable. . Bladder: Circumferential mural thickening. . Reproductive System: Prostatic fiduciary markers in place.  . Vascular: Unremarkable. . Musculoskeletal: No acute displaced fractures. Multiple level mild thoracic lumbar spondylosis. Multilevel flowing osteophytes in the mid to lower thoracic spine consistent with diffuse idiopathic skeletal hyperostosis (DISH).. Multilevel mild lumbar facet arthropathy. No aggressive focal bony lesions.    Addendum by Raymondo Maiden, MD on 01/08/2024  9:48 PM EDT **  ADDENDUM #1 **  Addendum to clarify that the peritoneal and subserosal implants in impression #1 are similar to 07/08/2023.   01/10/2023 MRI Abd MRCP w wo contrast: IMPRESSION: 1. Unchanged 0.7 cm fluid signal cystic lesion of the ventral pancreatic head. No solid component or suspicious contrast enhancement. This is most consistent with a small side branch IPMN. As there is no observed increased risk of malignancy for such lesions smaller than 2 cm, particularly given well established stability, no further follow-up or characterization  is required. 2. Similar very poorly, or nonenhancing soft tissue mass interposed between the greater curvature of the stomach, transverse colon, and tail of the pancreas, measuring 6.0 x 3.1 cm. This is consistent with known GIST. 3. Hepatic steatosis. 4. Status post cholecystectomy.     CT virtual colonoscopy 12/2018: -no colonic polyp, stricture or mass  Colonoscopy October 29, 2018 - Markedly redundant colon with benign-appearing colonic stricture.  Stricture appears to be noncritical.  Landmarks of cecum/ileocecal valve not confirmed although the blood into the cecum appeared to have been reached.  EGD October 29, 2018 -Abnormal esophagus consistent with short segment Barrett's status post biopsy, negative for intestinal metaplasia -Small hiatal hernia.  Deformed stomach likely related to prior surgery.  Abnormal mucosa of uncertain significance status post biopsy, reactive gastropathy, mild chronic gastritis, no H. pylori - Normal duodenal bulb and second portion duodenum  Medications   Current Outpatient Medications  Medication Sig Dispense Refill   alfuzosin  (UROXATRAL ) 10 MG 24 hr tablet Take 1 tablet (10 mg total) by mouth at bedtime. 30 tablet 11   amLODipine (NORVASC) 5 MG tablet Take 5 mg by mouth 2 (two) times daily.     Artificial Tear Ointment (DRY EYES OP) Place 1 drop into both eyes 2 (two) times daily as needed (dry eyes).      aspirin 81 MG EC tablet      CALCIUM PO Take 1 tablet by mouth 2 (two) times daily.     Cholecalciferol 25 MCG (1000 UT)  tablet Take by mouth daily.     ferrous sulfate 325 (65 FE) MG tablet Take 325 mg by mouth. Taking iron every day     imatinib (GLEEVEC) 400 MG tablet Take 1 tablet by mouth at bedtime.     JANUVIA 100 MG tablet Take 50 mg by mouth daily. 50 mg     latanoprost (XALATAN) 0.005 % ophthalmic solution SMARTSIG:In Eye(s)     losartan (COZAAR) 100 MG tablet Take 100 mg by mouth daily.     Magnesium  400 MG CAPS Take by mouth daily.      metoprolol (LOPRESSOR) 50 MG tablet Take 50-75 mg by mouth 2 (two) times daily. 50mg  in am and 75mg  at night     Multiple Vitamins-Minerals (MULTIVITAMIN WITH MINERALS) tablet Take 1 tablet by mouth daily. Centrum adult 50 plus     naproxen  (NAPROSYN ) 500 MG tablet Take 500 mg by mouth 2 (two) times daily.     nitroGLYCERIN (NITROSTAT) 0.4 MG SL tablet Place 0.4 mg under the tongue every 5 (five) minutes as needed for chest pain.     QUEtiapine (SEROQUEL) 50 MG tablet Take 75 mg by mouth.     RABEprazole  (ACIPHEX ) 20 MG tablet Take 1 tablet (20 mg total) by mouth daily. 90 tablet 3   rosuvastatin (CRESTOR) 10 MG tablet Take 10 mg by mouth daily.      tadalafil  (CIALIS ) 20 MG tablet Take 1 tablet (20 mg total) by mouth daily as needed. 10 tablet 5   vitamin C (ASCORBIC ACID) 250 MG tablet Take 250 mg by mouth daily.     No current facility-administered medications for this visit.    Allergies   Allergies as of 03/12/2024 - Review Complete 03/12/2024  Allergen Reaction Noted   Lisinopril Other (See Comments) 12/29/2017   Metformin Diarrhea 03/05/2019   Enoxaparin Rash 01/01/2013   Lovenox [enoxaparin sodium] Hives and Rash 09/30/2014   Penicillins Rash      Past Medical History   Past Medical History:  Diagnosis Date   Anemia    Anxiety    Barrett esophagus    Last EGD 05/27/08 Barrett's without dyplasia. EGD due 05/2011   Chronic back pain    Colitis    In 1980s, dx with UC by Dr. Fernand at time of TCS, 1992 TCS, chronic colitis   Depression    DM type 2 (diabetes mellitus, type 2) (HCC)    Fatty liver     small spots on liver also   Fibromyalgia    GERD (gastroesophageal reflux disease)    GIST, malignant (HCC)    Recurrent, on Gleevec, followed by [facility]   Helicobacter pylori gastritis 2009   treated   History of hiatal hernia    HTN (hypertension)    Hydrocele    Hyperlipidemia    IBS (irritable bowel syndrome)    Malignant stromal tumor of stomach (HCC)     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   Perceptive hearing loss, both sides    weras hearing aides both ears   Prostate cancer (HCC)    PTSD (post-traumatic stress disorder)    PTSD (post-traumatic stress disorder)    Pulmonary nodules chest ct 08-25-2019   stable per chest ct   Skin abnormality 05/31/2020   sore on right shin x 1 week no drainage healing wife putting neosporin on daily   Sleep apnea    no cpap did not tolerate cpap    Small bowel tumor    presented  with SBO (distal-near TI), path showed spindle cell neoplasm 6.5cm   Tubular adenoma of colon 10/08/2010   Last colonosocpy 1 cecal TA   Wears glasses     Past Surgical History   Past Surgical History:  Procedure Laterality Date   APPENDECTOMY  July 2014   at time of small bowel resection   BIOPSY  01/06/2018   Procedure: BIOPSY;  Surgeon: Shaaron Lamar HERO, MD;  [facility]: [facility];  Service: Endoscopy;;  gastric biopsy , esophageal biopsy   BIOPSY  10/29/2018   Procedure: BIOPSY;  Surgeon: Shaaron Lamar HERO, MD;  Location: [facility];  Service: Endoscopy;;  gastric, esophageal   CARDIAC CATHETERIZATION  2001   saw cardiology for 1 year and released   CHOLECYSTECTOMY  1980's or 1990's   COLONOSCOPY  10/08/2010   Normal rectum/tubular adenoma/normal random biopsy. Next TCS due 10/2015.   COLONOSCOPY N/A 09/30/2014   Dr. Shaaron: Redunant colon . Status post segmental biopsy and stool sampling. Negative stool samples and colonic biopsies.    COLONOSCOPY WITH PROPOFOL  N/A 10/29/2018   Surgeon: Shaaron Lamar HERO, MD; markedly redundant colon with benign-appearing noncritical colonic stricture, inability to identify landmarks of cecum and ileocecal valve.  Apparently patient had virtual colonoscopy thereafter.  Dr. Shaaron did not recommend repeating colonoscopy.   ESOPHAGOGASTRODUODENOSCOPY  07/31/2011   Dr. Elnor segment Barrett's esophagus s/p bx Hiatal hernia. Duodenal diverticulum. No dysplasia.Next EGD 07/2014.    ESOPHAGOGASTRODUODENOSCOPY N/A 09/30/2014   Dr. Shaaron: Abnormal distal esophagus consistant with prior diagnosis of short segment Barrett's esophagus status post biopsy. 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 Hiatal hernia. Abnormal Gastric mucosa of uncertain significance status post gastric biopsy. path with +Barrett's esophagus but no dysplasia, mild chronic gastritis. Surveillance for Barrett's due in 2019   ESOPHAGOGASTRODUODENOSCOPY N/A 01/06/2018   Procedure: ESOPHAGOGASTRODUODENOSCOPY (EGD);  Surgeon: Shaaron Lamar HERO, MD;  Location: [facility];  Service: Endoscopy;  Laterality: N/A;  12:00pm   ESOPHAGOGASTRODUODENOSCOPY (EGD) WITH PROPOFOL  N/A 10/29/2018    Surgeon: Shaaron Lamar HERO, MD;  appearance of Barrett's esophagus though biopsy with reactive/regenerative changes, moderate gastric mucosa with some deformity likely related to prior surgery s/p biopsy (chronic gastritis), normal examined duodenum.   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)   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 on 07/2015    PROSTATE BIOPSY  2021   RADIOACTIVE SEED IMPLANT N/A 06/02/2020   Procedure: RADIOACTIVE SEED IMPLANT/BRACHYTHERAPY IMPLANT;  Surgeon: Sherrilee Belvie CROME, MD;  Location: [facility];  Service: Urology;  Laterality: N/A;   SPACE OAR INSTILLATION N/A 06/02/2020   Procedure: SPACE OAR INSTILLATION;  Surgeon: Sherrilee Belvie CROME, MD;  Location: [facility];  Service: Urology;  Laterality: N/A;    Past Family History   Family History  Problem Relation Age of Onset   Heart disease Mother    Suicidality Mother    Heart  attack Father        48   Heart disease Father    Heart disease Sister    Prostate cancer Brother    Colon cancer Neg Hx    Breast cancer Neg Hx    Pancreatic cancer Neg Hx  Past Social History   Social History   Socioeconomic History   Marital status: Married    Spouse name: Not on file   Number of children: 1   Years of education: Not on file   Highest education level: Not on file  Occupational History   Occupation: disability  Tobacco Use   Smoking status: Never   Smokeless tobacco: Never  Vaping Use   Vaping status: Never Used  Substance and Sexual Activity   Alcohol use: No    Alcohol/week: 0.0 standard drinks of alcohol   Drug use: No   Sexual activity: Yes    Partners: Female    Birth control/protection: None    Comment: spouse  Other Topics Concern   Not on file  Social History Narrative   Not on file   Social Drivers of Health   Financial Resource Strain: Not on file  Food Insecurity: Not on file  Transportation Needs: Not on file  Physical Activity: Not on file  Stress: Not on file  Social Connections: Not on file  Intimate Partner Violence: Not on file    Review of Systems   General: Negative for anorexia, weight loss, fever, chills, fatigue, weakness. ENT: Negative for hoarseness, difficulty swallowing , nasal congestion. CV: Negative for chest pain, angina, palpitations, dyspnea on exertion, peripheral edema.  Respiratory: Negative for dyspnea at rest, dyspnea on exertion, cough, sputum, wheezing.  GI: See history of present illness. GU:  Negative for dysuria, hematuria, urinary incontinence, urinary frequency, nocturnal urination.  Endo: Negative for unusual weight change.     Physical Exam   BP 138/67 (BP Location: Right Arm, Patient Position: Sitting, Cuff Size: Normal)   Pulse 60   Temp 98.1 F (36.7 C) (Oral)   Ht 5' 9 (1.753 m)   Wt 166 lb 3.2 oz (75.4 kg)   SpO2 98%   BMI 24.54 kg/m    General: Well-nourished,  well-developed in no acute distress.  Eyes: No icterus. Mouth: Oropharyngeal mucosa moist and pink   Lungs: Clear to auscultation bilaterally.  Heart: Regular rate and rhythm, no murmurs rubs or gallops.  Abdomen: Bowel sounds are normal, nontender, nondistended, no hepatosplenomegaly or masses,  no abdominal bruits or hernia , no rebound or guarding.  Rectal: not performed Extremities: No lower extremity edema. No clubbing or deformities. Neuro: Alert and oriented x 4   Skin: Warm and dry, no jaundice.   Psych: Alert and cooperative, normal mood and affect.  Labs   Labs from February 17, 2024: White blood cell count 3.9, hemoglobin 11.8, MCV 100.6, platelets 187,000, creatinine 1.29, BUN 10, GFR 56, albumin 3.8  Labs from July 2025: Creatinine 1.55, sodium 140, albumin 3.8, total bilirubin 0.3, alk phos 58, AST 16, ALT 12, hemoglobin 12.1, platelets 195  Imaging Studies   No results found.  Assessment/Plan:   78 y.o. 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 Anselm), GERD, multiple GI surgeries for GIST, mesenteric fibromatosis, and small bowel tumor detailed in surgical history, GERD, Barrett's esophagus, chronic diarrhea since the 80s, and colonic adenomas with last EGD and colonoscopy in 2020 with no recommendations to repeat. He is presenting today for follow-up on GERD/chronic diarrhea.    GERD: Well-controlled on Aciphex  20 mg daily.  Weight loss/early satiety: Patient reports decreased appetite, early satiety with modest weight loss over the past 6 months. Query related to decreased gastric accomodation in setting of 6.4  cm implant on greater curvature of the stomach. Patient mentioned his oncologist may want him to have EGD. With UGI symptoms, may be beneficial at this time. Also noted to have chronic esophagitis on his CTs.    Chronic diarrhea: Chronic symptoms, can  have up to 7-8 stools daily, some watery, nocturnal at times. Although he reports fairly good control if uses imodium 2mg  first thing in the morning. Sometimes sees toilet tissue hematochezia with frequent diarrhea and wiping. Remote questionable history of UC in the 1980s but subsequent follow up colonoscopy normal with normal random colon biopsies in 2016. Last attempted colonoscopy in 2020 with right colon benign appearing stricture, exam could not be completed with adult or pediatric colonoscope. Follow up virtual colonoscopy unremarkable. Last two CTs with multifocal colonic mural thickening of the colon, most pronounced involving the distal descending, sigmoid colon and rectum. No evidence of obstruction.  May benefit from colonoscopy to evaluate persistent colonic findings.   To discuss above findings with Dr. Shaaron. Patient may benefit from both EGD and colonoscopy to further evaluate current symptoms and CT findings as outlined.     Sonny RAMAN. Ezzard, MHS, PA-C [facility]

## 2024-03-12 NOTE — Patient Instructions (Signed)
 We will be in touch next week with further information regarding if upper endoscopy or any other testing is needed.  Continue your reflux medication (rabeprazole ) every day before breakfast.  Continue Imodium 1/2 to 1 tablet every morning to help with chronic diarrhea.   If you are having increased gas that is significant and disruptive, pay attention to what you ate that day. If on the list below, try to limit to no more than 1-2 times per week.  Foods that increase gas/bloating Fruits Fresh, dried, and juiced forms of apple, pear, watermelon, peach, plum, cherries, apricots, blackberries, boysenberries, figs, nectarines, and mango. Avocado. Vegetables Chicory root, artichoke, asparagus, cabbage, snow peas, Brussels sprouts, broccoli, sugar snap peas, mushrooms, celery, and cauliflower. Onions, garlic, leeks, and the white part of scallions. Grains Wheat, including kamut, durum, and semolina. Barley and bulgur. Couscous. Wheat-based cereals. Wheat noodles, bread, crackers, and pastries. Meats and other proteins Fried or fatty meat. Sausage. Cashews and pistachios. Soybeans, baked beans, black beans, chickpeas, kidney beans, fava beans, navy beans, lentils, black-eyed peas, and split peas. Dairy Milk, yogurt, ice cream, and soft cheese. Cream and sour cream. Milk-based sauces. Custard. Buttermilk. Soy milk. Seasoning and other foods Any sugar-free gum or candy. Foods that contain artificial sweeteners such as sorbitol, mannitol, isomalt, or xylitol. Foods that contain honey, high-fructose corn syrup, or agave. Bouillon, vegetable stock, beef stock, and chicken stock. Garlic and onion powder. Condiments made with onion, such as hummus, chutney, pickles, relish, salad dressing, and salsa. Tomato paste. Beverages Chicory-based drinks. Coffee substitutes. Chamomile tea. Fennel tea. Sweet or fortified wines such as port or sherry. Diet soft drinks made with isomalt, mannitol, maltitol, sorbitol, or  xylitol. Apple, pear, and mango juice. Juices with high-fructose corn syrup. The items listed above may not be a complete list of foods and beverages you should avoid. Contact a dietitian for more information.

## 2024-03-23 ENCOUNTER — Ambulatory Visit (INDEPENDENT_AMBULATORY_CARE_PROVIDER_SITE_OTHER): Admitting: Physical Medicine and Rehabilitation

## 2024-03-23 ENCOUNTER — Other Ambulatory Visit: Payer: Self-pay

## 2024-03-23 VITALS — BP 171/73 | HR 60

## 2024-03-23 DIAGNOSIS — M5412 Radiculopathy, cervical region: Secondary | ICD-10-CM | POA: Diagnosis not present

## 2024-03-23 MED ORDER — METHYLPREDNISOLONE ACETATE 40 MG/ML IJ SUSP
40.0000 mg | Freq: Once | INTRAMUSCULAR | Status: AC
  Administered 2024-03-23: 40 mg

## 2024-03-23 NOTE — Progress Notes (Signed)
 Pain Scale   Average Pain 7 Patient advising he has chronic neck pain radiating to right arm Patient advising his pain increase when moving he's head.        +Driver, -BT, -Dye Allergies.

## 2024-03-23 NOTE — Progress Notes (Signed)
 Donald Klein - 78 y.o. male MRN [id number]  Date of birth: 1945-08-08  Office Visit Note: Visit Date: 03/23/2024 PCP: Luke Agent, MD (Inactive) Referred by: Trudy Duwaine BRAVO, NP  Subjective: Chief Complaint  Patient presents with   Neck - Pain   HPI:  Donald Klein is a 77 y.o. male who comes in today at the request of Duwaine Trudy, FNP for planned Right C7-T1 Cervical Interlaminar epidural steroid injection with fluoroscopic guidance.  The patient has failed conservative care including home exercise, medications, time and activity modification.  This injection will be diagnostic and hopefully therapeutic.  Please see requesting physician notes for further details and justification.   ROS Otherwise per HPI.  Assessment & Plan: Visit Diagnoses:    ICD-10-CM   1. Radiculopathy, cervical region  M54.12 XR C-ARM NO REPORT    Epidural Steroid injection    methylPREDNISolone  acetate (DEPO-MEDROL ) injection 40 mg      Plan: No additional findings.   Meds & Orders:  Meds ordered this encounter  Medications   methylPREDNISolone  acetate (DEPO-MEDROL ) injection 40 mg    Orders Placed This Encounter  Procedures   XR C-ARM NO REPORT   Epidural Steroid injection    Follow-up: Return for visit to requesting provider as needed.   Procedures: No procedures performed  Cervical Epidural Steroid Injection - Interlaminar Approach with Fluoroscopic Guidance  Patient: Donald Klein      Date of Birth: Jul 28, 1945 MRN: [id number] PCP: Luke Agent, MD (Inactive)      Visit Date: 03/23/2024   Universal Protocol:    Date/Time: 11/18/251:52 PM  Consent Given By: the patient  Position: PRONE  Additional Comments: Vital signs were monitored before and after the procedure. Patient was prepped and draped in the usual sterile fashion. The correct patient, procedure, and site was verified.   Injection Procedure Details:   Procedure diagnoses: Radiculopathy, cervical region  [M54.12]    Meds Administered:  Meds ordered this encounter  Medications   methylPREDNISolone  acetate (DEPO-MEDROL ) injection 40 mg     Laterality: Right  Location/Site: C7-T1  Needle: 3.5 in., 20 ga. Tuohy  Needle Placement: Paramedian epidural space  Findings:  -Comments: Excellent flow of contrast into the epidural space.  Procedure Details: Using a paramedian approach from the side mentioned above, the region overlying the inferior lamina was localized under fluoroscopic visualization and the soft tissues overlying this structure were infiltrated with 4 ml. of 1% Lidocaine  without Epinephrine. A # 20 gauge, Tuohy needle was inserted into the epidural space using a paramedian approach.  The epidural space was localized using loss of resistance along with contralateral oblique bi-planar fluoroscopic views.  After negative aspirate for air, blood, and CSF, a 2 ml. volume of Isovue-250 was injected into the epidural space and the flow of contrast was observed. Radiographs were obtained for documentation purposes.   The injectate was administered into the level noted above.  Additional Comments:  The patient tolerated the procedure well Dressing: 2 x 2 sterile gauze and Band-Aid    Post-procedure details: Patient was observed during the procedure. Post-procedure instructions were reviewed.  Patient left the clinic in stable condition.   Clinical History: Narrative & Impression CLINICAL DATA:  Neck pain   EXAM: MRI CERVICAL SPINE WITHOUT CONTRAST   TECHNIQUE: Multiplanar, multisequence MR imaging of the cervical spine was performed. No intravenous contrast was administered.   COMPARISON:  None Available.   FINDINGS: The craniocervical junction is normal.   There is no significant bone marrow signal  abnormality.   The cervical spinal cord is normal.   C2-C3: The disc is normal. There is facet arthropathy on the right. No spinal stenosis or foraminal stenosis    C3-C4: Mild disc bulge. There is bilateral facet arthropathy, worse on the right. No spinal stenosis or foraminal stenosis   C4-C5: The disc is normal.  No significant facet disease   C5-C6: The disc is normal.  No significant facet disease   C6-C7: There is mild degenerative disc disease with a mild disc bulge. There are small foraminal spurs, larger on the left. No significant facet disease. There is mild bilateral neural foraminal stenosis   C7-T1: Normal   IMPRESSION: Multilevel mild degenerative changes.   There is no disc herniation or spinal stenosis.     Electronically Signed   By: Nancyann Burns M.D.   On: 01/07/2024 08:56     Objective:  VS:  HT:    WT:   BMI:     BP:(!) 171/73  HR:60bpm  TEMP: ( )  RESP:  Physical Exam Vitals and nursing note reviewed.  Constitutional:      General: He is not in acute distress.    Appearance: Normal appearance. He is not ill-appearing.  HENT:     Head: Normocephalic and atraumatic.     Right Ear: External ear normal.     Left Ear: External ear normal.  Eyes:     Extraocular Movements: Extraocular movements intact.  Cardiovascular:     Rate and Rhythm: Normal rate.     Pulses: Normal pulses.  Abdominal:     General: There is no distension.     Palpations: Abdomen is soft.  Musculoskeletal:        General: No signs of injury.     Cervical back: Neck supple. Tenderness present. No rigidity.     Right lower leg: No edema.     Left lower leg: No edema.     Comments: Patient has good strength in the upper extremities with 5 out of 5 strength in wrist extension long finger flexion APB.  No intrinsic hand muscle atrophy.  Negative Hoffmann's test.  Lymphadenopathy:     Cervical: No cervical adenopathy.  Skin:    Findings: No erythema or rash.  Neurological:     General: No focal deficit present.     Mental Status: He is alert and oriented to person, place, and time.     Sensory: No sensory deficit.     Motor: No  weakness or abnormal muscle tone.     Coordination: Coordination normal.  Psychiatric:        Mood and Affect: Mood normal.        Behavior: Behavior normal.      Imaging: No results found.

## 2024-03-23 NOTE — Procedures (Signed)
 Cervical Epidural Steroid Injection - Interlaminar Approach with Fluoroscopic Guidance  Patient: Donald Klein      Date of Birth: [birth year] MRN: [id number] PCP: Luke Agent, MD (Inactive)      Visit Date: 03/23/2024   Universal Protocol:    Date/Time: 11/18/251:52 PM  Consent Given By: the patient  Position: PRONE  Additional Comments: Vital signs were monitored before and after the procedure. Patient was prepped and draped in the usual sterile fashion. The correct patient, procedure, and site was verified.   Injection Procedure Details:   Procedure diagnoses: Radiculopathy, cervical region [M54.12]    Meds Administered:  Meds ordered this encounter  Medications   methylPREDNISolone  acetate (DEPO-MEDROL ) injection 40 mg     Laterality: Right  Location/Site: C7-T1  Needle: 3.5 in., 20 ga. Tuohy  Needle Placement: Paramedian epidural space  Findings:  -Comments: Excellent flow of contrast into the epidural space.  Procedure Details: Using a paramedian approach from the side mentioned above, the region overlying the inferior lamina was localized under fluoroscopic visualization and the soft tissues overlying this structure were infiltrated with 4 ml. of 1% Lidocaine  without Epinephrine. A # 20 gauge, Tuohy needle was inserted into the epidural space using a paramedian approach.  The epidural space was localized using loss of resistance along with contralateral oblique bi-planar fluoroscopic views.  After negative aspirate for air, blood, and CSF, a 2 ml. volume of Isovue-250 was injected into the epidural space and the flow of contrast was observed. Radiographs were obtained for documentation purposes.   The injectate was administered into the level noted above.  Additional Comments:  The patient tolerated the procedure well Dressing: 2 x 2 sterile gauze and Band-Aid    Post-procedure details: Patient was observed during the procedure. Post-procedure instructions  were reviewed.  Patient left the clinic in stable condition.

## 2024-06-28 ENCOUNTER — Other Ambulatory Visit: Payer: Medicare PPO

## 2024-07-02 ENCOUNTER — Ambulatory Visit: Payer: Medicare PPO | Admitting: Urology
